# Patient Record
Sex: Male | Born: 1940 | Race: White | Hispanic: No | Marital: Married | Smoking: Never smoker
Health system: Southern US, Community
[De-identification: ages and names within clinical notes are randomized; demographics above are authoritative.]

## PROBLEM LIST (undated history)

## (undated) DIAGNOSIS — K222 Esophageal obstruction: Secondary | ICD-10-CM

## (undated) DIAGNOSIS — A0472 Enterocolitis due to Clostridium difficile, not specified as recurrent: Secondary | ICD-10-CM

## (undated) DIAGNOSIS — I10 Essential (primary) hypertension: Secondary | ICD-10-CM

## (undated) DIAGNOSIS — K449 Diaphragmatic hernia without obstruction or gangrene: Secondary | ICD-10-CM

## (undated) DIAGNOSIS — I2510 Atherosclerotic heart disease of native coronary artery without angina pectoris: Secondary | ICD-10-CM

## (undated) DIAGNOSIS — K224 Dyskinesia of esophagus: Secondary | ICD-10-CM

## (undated) DIAGNOSIS — A0471 Enterocolitis due to Clostridium difficile, recurrent: Secondary | ICD-10-CM

## (undated) DIAGNOSIS — K225 Diverticulum of esophagus, acquired: Secondary | ICD-10-CM

## (undated) HISTORY — DX: Enterocolitis due to Clostridium difficile, not specified as recurrent: A04.72

## (undated) HISTORY — DX: Diaphragmatic hernia without obstruction or gangrene: K44.9

## (undated) HISTORY — DX: Essential (primary) hypertension: I10

## (undated) HISTORY — DX: Diverticulum of esophagus, acquired: K22.5

## (undated) HISTORY — DX: Enterocolitis due to Clostridium difficile, recurrent: A04.71

## (undated) HISTORY — DX: Esophageal obstruction: K22.2

## (undated) HISTORY — PX: UMBILICAL HERNIA REPAIR: SHX196

## (undated) HISTORY — PX: BELPHAROPTOSIS REPAIR: SHX369

## (undated) HISTORY — DX: Dyskinesia of esophagus: K22.4

## (undated) HISTORY — PX: CATARACT EXTRACTION: SUR2

---
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
## 2001-06-01 ENCOUNTER — Encounter: Payer: Self-pay | Admitting: Gastroenterology

## 2001-06-01 ENCOUNTER — Ambulatory Visit (HOSPITAL_COMMUNITY): Admission: RE | Admit: 2001-06-01 | Discharge: 2001-06-01 | Payer: Self-pay | Admitting: Gastroenterology

## 2001-07-18 ENCOUNTER — Encounter: Admission: RE | Admit: 2001-07-18 | Discharge: 2001-07-18 | Payer: Self-pay | Admitting: Gastroenterology

## 2001-07-18 ENCOUNTER — Encounter: Payer: Self-pay | Admitting: Gastroenterology

## 2002-12-05 ENCOUNTER — Encounter: Payer: Self-pay | Admitting: Emergency Medicine

## 2002-12-05 ENCOUNTER — Emergency Department (HOSPITAL_COMMUNITY): Admission: EM | Admit: 2002-12-05 | Discharge: 2002-12-05 | Payer: Self-pay | Admitting: Emergency Medicine

## 2002-12-31 ENCOUNTER — Ambulatory Visit (HOSPITAL_COMMUNITY): Admission: RE | Admit: 2002-12-31 | Discharge: 2003-01-01 | Payer: Self-pay | Admitting: General Surgery

## 2002-12-31 ENCOUNTER — Encounter: Payer: Self-pay | Admitting: General Surgery

## 2004-10-11 HISTORY — PX: CHOLECYSTECTOMY: SHX55

## 2014-03-02 ENCOUNTER — Encounter (HOSPITAL_COMMUNITY): Payer: Self-pay | Admitting: Emergency Medicine

## 2014-03-02 DIAGNOSIS — Y838 Other surgical procedures as the cause of abnormal reaction of the patient, or of later complication, without mention of misadventure at the time of the procedure: Secondary | ICD-10-CM | POA: Diagnosis not present

## 2014-03-02 DIAGNOSIS — K5669 Other intestinal obstruction: Secondary | ICD-10-CM | POA: Diagnosis present

## 2014-03-02 DIAGNOSIS — N329 Bladder disorder, unspecified: Secondary | ICD-10-CM | POA: Diagnosis present

## 2014-03-02 DIAGNOSIS — Z9089 Acquired absence of other organs: Secondary | ICD-10-CM

## 2014-03-02 DIAGNOSIS — Z79899 Other long term (current) drug therapy: Secondary | ICD-10-CM

## 2014-03-02 DIAGNOSIS — E871 Hypo-osmolality and hyponatremia: Secondary | ICD-10-CM | POA: Diagnosis not present

## 2014-03-02 DIAGNOSIS — K449 Diaphragmatic hernia without obstruction or gangrene: Principal | ICD-10-CM | POA: Diagnosis present

## 2014-03-02 DIAGNOSIS — K560 Paralytic ileus: Secondary | ICD-10-CM | POA: Diagnosis not present

## 2014-03-02 DIAGNOSIS — K929 Disease of digestive system, unspecified: Secondary | ICD-10-CM | POA: Diagnosis not present

## 2014-03-02 LAB — BASIC METABOLIC PANEL
BUN: 14 mg/dL (ref 6–23)
CO2: 27 mEq/L (ref 19–32)
Calcium: 10.3 mg/dL (ref 8.4–10.5)
Chloride: 102 mEq/L (ref 96–112)
Creatinine, Ser: 1.17 mg/dL (ref 0.50–1.35)
GFR calc Af Amer: 71 mL/min — ABNORMAL LOW (ref >90)
GFR calc non Af Amer: 61 mL/min — ABNORMAL LOW (ref >90)
Glucose, Bld: 145 mg/dL — ABNORMAL HIGH (ref 70–99)
Potassium: 4.9 mEq/L (ref 3.7–5.3)
Sodium: 140 mEq/L (ref 137–147)

## 2014-03-02 LAB — CBC WITH DIFFERENTIAL/PLATELET
Basophils Absolute: 0 10*3/uL (ref 0.0–0.1)
Basophils Relative: 0 % (ref 0–1)
Eosinophils Absolute: 0 10*3/uL (ref 0.0–0.7)
Eosinophils Relative: 1 % (ref 0–5)
HCT: 44.4 % (ref 39.0–52.0)
Hemoglobin: 15.6 g/dL (ref 13.0–17.0)
Lymphocytes Relative: 11 % — ABNORMAL LOW (ref 12–46)
Lymphs Abs: 0.8 10*3/uL (ref 0.7–4.0)
MCH: 30.5 pg (ref 26.0–34.0)
MCHC: 35.1 g/dL (ref 30.0–36.0)
MCV: 86.9 fL (ref 78.0–100.0)
Monocytes Absolute: 0.2 10*3/uL (ref 0.1–1.0)
Monocytes Relative: 3 % (ref 3–12)
Neutro Abs: 6.6 10*3/uL (ref 1.7–7.7)
Neutrophils Relative %: 85 % — ABNORMAL HIGH (ref 43–77)
Platelets: 193 10*3/uL (ref 150–400)
RBC: 5.11 MIL/uL (ref 4.22–5.81)
RDW: 13.3 % (ref 11.5–15.5)
WBC: 7.7 10*3/uL (ref 4.0–10.5)

## 2014-03-02 LAB — I-STAT TROPONIN, ED: Troponin i, poc: 0 ng/mL (ref 0.00–0.08)

## 2014-03-02 NOTE — ED Notes (Signed)
Patient stated that he mowed the grass, washed the car and walked the dog without eating.   When he came in he started with epigastric pain and thought that it was because he was hungry.   Pain continued.  Denies CP but c/o upper abd pain

## 2014-03-03 ENCOUNTER — Emergency Department (HOSPITAL_COMMUNITY): Payer: Medicare Other

## 2014-03-03 ENCOUNTER — Inpatient Hospital Stay (HOSPITAL_COMMUNITY)
Admission: EM | Admit: 2014-03-03 | Discharge: 2014-03-14 | DRG: 327 | Disposition: A | Discharge status: Home / Self Care | Payer: Medicare Other | Attending: Surgery | Admitting: Surgery

## 2014-03-03 ENCOUNTER — Encounter (HOSPITAL_COMMUNITY): Payer: Self-pay | Admitting: Radiology

## 2014-03-03 DIAGNOSIS — Z9089 Acquired absence of other organs: Secondary | ICD-10-CM

## 2014-03-03 DIAGNOSIS — Z9049 Acquired absence of other specified parts of digestive tract: Secondary | ICD-10-CM

## 2014-03-03 DIAGNOSIS — K56609 Unspecified intestinal obstruction, unspecified as to partial versus complete obstruction: Secondary | ICD-10-CM

## 2014-03-03 DIAGNOSIS — N3289 Other specified disorders of bladder: Secondary | ICD-10-CM | POA: Diagnosis present

## 2014-03-03 DIAGNOSIS — R1013 Epigastric pain: Secondary | ICD-10-CM

## 2014-03-03 DIAGNOSIS — K56600 Partial intestinal obstruction, unspecified as to cause: Secondary | ICD-10-CM | POA: Diagnosis present

## 2014-03-03 DIAGNOSIS — K449 Diaphragmatic hernia without obstruction or gangrene: Secondary | ICD-10-CM

## 2014-03-03 LAB — HEPATIC FUNCTION PANEL
ALT: 18 U/L (ref 0–53)
AST: 26 U/L (ref 0–37)
Albumin: 4 g/dL (ref 3.5–5.2)
Alkaline Phosphatase: 63 U/L (ref 39–117)
Bilirubin, Direct: 0.3 mg/dL (ref 0.0–0.3)
Indirect Bilirubin: 0.7 mg/dL (ref 0.3–0.9)
Total Bilirubin: 1 mg/dL (ref 0.3–1.2)
Total Protein: 7.1 g/dL (ref 6.0–8.3)

## 2014-03-03 LAB — I-STAT TROPONIN, ED: Troponin i, poc: 0 ng/mL (ref 0.00–0.08)

## 2014-03-03 LAB — LIPASE, BLOOD: Lipase: 22 U/L (ref 11–59)

## 2014-03-03 LAB — I-STAT CG4 LACTIC ACID, ED: Lactic Acid, Venous: 1.28 mmol/L (ref 0.5–2.2)

## 2014-03-03 MED ORDER — MORPHINE SULFATE 4 MG/ML IJ SOLN
4.0000 mg | Freq: Once | INTRAMUSCULAR | Status: AC
  Administered 2014-03-03: 4 mg via INTRAVENOUS
  Filled 2014-03-03: qty 1

## 2014-03-03 MED ORDER — IOHEXOL 300 MG/ML  SOLN
25.0000 mL | Freq: Once | INTRAMUSCULAR | Status: AC | PRN
  Administered 2014-03-03: 25 mL via ORAL

## 2014-03-03 MED ORDER — FAMOTIDINE IN NACL 20-0.9 MG/50ML-% IV SOLN
20.0000 mg | Freq: Two times a day (BID) | INTRAVENOUS | Status: DC
  Administered 2014-03-03 – 2014-03-11 (×18): 20 mg via INTRAVENOUS
  Filled 2014-03-03 (×21): qty 50

## 2014-03-03 MED ORDER — ONDANSETRON HCL 4 MG/2ML IJ SOLN
4.0000 mg | Freq: Once | INTRAMUSCULAR | Status: AC
  Administered 2014-03-03: 4 mg via INTRAVENOUS
  Filled 2014-03-03: qty 2

## 2014-03-03 MED ORDER — ONDANSETRON 8 MG/NS 50 ML IVPB
8.0000 mg | Freq: Four times a day (QID) | INTRAVENOUS | Status: DC | PRN
  Filled 2014-03-03: qty 8

## 2014-03-03 MED ORDER — ONDANSETRON HCL 4 MG PO TABS: 4.0000 mg | ORAL_TABLET | Freq: Four times a day (QID) | ORAL | Status: DC | PRN

## 2014-03-03 MED ORDER — POTASSIUM CHLORIDE IN NACL 20-0.9 MEQ/L-% IV SOLN
INTRAVENOUS | Status: DC
Start: 2014-03-03 — End: 2014-03-06
  Administered 2014-03-03: 75 mL/h via INTRAVENOUS
  Administered 2014-03-04 (×2): via INTRAVENOUS
  Administered 2014-03-05: 1000 mL via INTRAVENOUS
  Administered 2014-03-06: 01:00:00 via INTRAVENOUS
  Filled 2014-03-03 (×7): qty 1000

## 2014-03-03 MED ORDER — HYDROMORPHONE HCL PF 1 MG/ML IJ SOLN
0.5000 mg | INTRAMUSCULAR | Status: DC | PRN
  Administered 2014-03-03: 0.5 mg via INTRAVENOUS
  Filled 2014-03-03: qty 1

## 2014-03-03 MED ORDER — IOHEXOL 300 MG/ML  SOLN
100.0000 mL | Freq: Once | INTRAMUSCULAR | Status: AC | PRN
  Administered 2014-03-03: 100 mL via INTRAVENOUS

## 2014-03-03 MED ORDER — MORPHINE SULFATE 2 MG/ML IJ SOLN
1.0000 mg | INTRAMUSCULAR | Status: DC | PRN
  Administered 2014-03-03 – 2014-03-05 (×7): 1 mg via INTRAVENOUS
  Filled 2014-03-03 (×7): qty 1

## 2014-03-03 MED ORDER — ENOXAPARIN SODIUM 40 MG/0.4ML ~~LOC~~ SOLN
40.0000 mg | SUBCUTANEOUS | Status: DC
  Administered 2014-03-04 – 2014-03-05 (×2): 40 mg via SUBCUTANEOUS
  Filled 2014-03-03 (×4): qty 0.4

## 2014-03-03 MED ORDER — FENTANYL CITRATE 0.05 MG/ML IJ SOLN
50.0000 ug | Freq: Once | INTRAMUSCULAR | Status: AC
  Administered 2014-03-03: 50 ug via INTRAVENOUS
  Filled 2014-03-03: qty 2

## 2014-03-03 MED ORDER — SODIUM CHLORIDE 0.9 % IV BOLUS (SEPSIS)
1000.0000 mL | Freq: Once | INTRAVENOUS | Status: AC
  Administered 2014-03-03: 1000 mL via INTRAVENOUS

## 2014-03-03 NOTE — ED Notes (Signed)
NPO

## 2014-03-03 NOTE — H&P (Addendum)
[facility] History and Physical  Patient: Troy Sanchez  SHF:[id number]  DOB: 01/16/1941  DOS: the patient was seen and examined on 03/03/2014 PCP: Fanny Skates, MD  Chief Complaint: Abdominal pain  HPI: Troy Sanchez is a 73 y.o. male with Past medical history of cholecystectomy. Patient presented with complaints of abdominal pain the mentions that he had an active day throughout the day today and after his days work he started having complaints of pain in his upper stomach. This was followed by nausea. This was followed by dry heaving. The pain was progressively getting worse and therefore the patient was brought into the hospital. Patient denies any similar episode in the past of the severity of the accident. He has some on and off nausea but no vomiting. He has history of cholecystectomy 2011. No history of small bowel obstruction.  The patient is coming from home. And at his baseline independent for most of his ADL.  Review of Systems: as mentioned in the history of present illness.  A Comprehensive review of the other systems is negative.  History reviewed. No pertinent past medical history. History reviewed. No pertinent past surgical history. Social History:  reports that he has never smoked. He has never used smokeless tobacco. He reports that he drinks alcohol. He reports that he does not use illicit drugs.  No Known Allergies  History reviewed. No pertinent family history.  Prior to Admission medications   Medication Sig Start Date End Date Taking? Authorizing Provider  cholecalciferol (VITAMIN D) 1000 UNITS tablet Take 1,000 Units by mouth daily.   Yes Historical Provider, MD  Multiple Vitamins-Minerals (MULTIVITAMIN WITH MINERALS) tablet Take 1 tablet by mouth daily.   Yes Historical Provider, MD  Omega-3 Fatty Acids (FISH OIL) 1000 MG CAPS Take 1,000 mg by mouth daily.   Yes Historical Provider, MD  Simethicone (GAS RELIEF EXTRA STRENGTH PO) Take 1  tablet by mouth every hour as needed (gas).   Yes Historical Provider, MD    Physical Exam: Filed Vitals:   03/03/14 0600 03/03/14 0615 03/03/14 0630 03/03/14 0633  BP: 123/79 139/88 133/78 133/78  Pulse: 74 73 82 79  Temp:      TempSrc:      Resp: 20 18 16 18   Height:      Weight:      SpO2: 90% 96% 94% 100%    General: Alert, Awake and Oriented to Time, Place and Person. Appear in mild distress Eyes: PERRL ENT: Oral Mucosa clear moist. Neck: No JVD Cardiovascular: S1 and S2 Present, no Murmur, Peripheral Pulses Present Respiratory: Bilateral Air entry equal and Decreased, Clear to Auscultation,  No Crackles, no wheezes Abdomen: Bowel Sound Present, Soft and mild tender no guarding no rigidity Skin: No Rash Extremities: No Pedal edema, no calf tenderness Neurologic: Grossly no focal neuro deficit.  Labs on Admission:  CBC:  Recent Labs Lab 03/02/14 2317  WBC 7.7  NEUTROABS 6.6  HGB 15.6  HCT 44.4  MCV 86.9  PLT 193    CMP     Component Value Date/Time   NA 140 03/02/2014 2317   K 4.9 03/02/2014 2317   CL 102 03/02/2014 2317   CO2 27 03/02/2014 2317   GLUCOSE 145* 03/02/2014 2317   BUN 14 03/02/2014 2317   CREATININE 1.17 03/02/2014 2317   CALCIUM 10.3 03/02/2014 2317   PROT 7.1 03/03/2014 0225   ALBUMIN 4.0 03/03/2014 0225   AST 26 03/03/2014 0225   ALT 18 03/03/2014 0225   ALKPHOS  63 03/03/2014 0225   BILITOT 1.0 03/03/2014 0225   GFRNONAA 61* 03/02/2014 2317   GFRAA 71* 03/02/2014 2317     Recent Labs Lab 03/03/14 0225  LIPASE 22   No results found for this basename: AMMONIA,  in the last 168 hours  No results found for this basename: CKTOTAL, CKMB, CKMBINDEX, TROPONINI,  in the last 168 hours BNP (last 3 results) No results found for this basename: PROBNP,  in the last 8760 hours  Radiological Exams on Admission: Ct Abdomen Pelvis W Contrast  03/03/2014   CLINICAL DATA:  Epigastric pain with nausea.  EXAM: CT ABDOMEN AND PELVIS WITH CONTRAST   TECHNIQUE: Multidetector CT imaging of the abdomen and pelvis was performed using the standard protocol following bolus administration of intravenous contrast.  CONTRAST:  171mL OMNIPAQUE IOHEXOL 300 MG/ML SOLN, 62mL OMNIPAQUE IOHEXOL 300 MG/ML SOLN  COMPARISON:  None.  FINDINGS: Massive hiatal hernia, containing the entire stomach, multiple loops of fluid filled mildly distended small bowel with transition point along the left crus of the diaphragm, coronal 48/96. Small bowel within the hernia sac measures up to 3.4 cm in transaxial dimension. Hernia sac resultant compressive atelectasis. Mild mass effect on the left heart border, due to the hernia sac.  The liver, spleen, and adrenal glands are unremarkable. Status post cholecystectomy. A few punctate calcifications within the pancreatic head, without discrete mass.  The large bowel are normal in course and caliber without inflammatory changes. A few colonic diverticula with mild amount of retained large bowel stool. Normal appendix. No intraperitoneal free fluid nor free air.  Kidneys are orthotopic, demonstrating symmetric enhancement without hydronephrosis or renal masses. Bilateral nonobstructing nephrolithiasis measure up to 3 mm. The unopacified ureters are normal in course and caliber. Delayed imaging through the kidneys demonstrates symmetric prompt excretion to the proximal urinary collecting system. Urinary bladder is well distended, dense. 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  Aortoiliac vessels are normal in course and caliber with trace calcific atherosclerosis. No lymphadenopathy by CT size criteria. Internal reproductive organs are unremarkable. The soft tissues and included osseous structures are nonsuspicious. Small fat containing umbilical hernia.  IMPRESSION: Massive hiatal hernia, containing the entire stomach, and associated partial small bowel obstruction, with  transition point in the left upper quadrant, along the crus of the left diaphragm.  Bilateral nonobstructing nephrolithiasis.  2.5 cm mass at bladder trigone likely reflects prostatic invasion with associated prostatomegaly though, this less likely reflects hematoma.   Electronically Signed   By: Elon Alas   On: 03/03/2014 05:48    Assessment/Plan Principal Problem:   Partial small bowel obstruction Active Problems:   History of cholecystectomy   Hiatal hernia   1. Partial small bowel obstruction The patient is presenting with complaints of epigastric pain. A CT of the abdomen was performed in the ED which is positive for hiatal hernia as well as a small bowel obstruction partial. Patient has history of known hiatal hernia which is stable as per the patient and has not been evaluated since last 20 years. At present the patient mentions after insertion of NG tube he is feeling significantly better. He has almost put out 800 cc of fluid. At present I would continue with the NG tube we'll keep him n.p.o. except I states. Give him IV fluids with potassium. I would also give him Zofran as needed, Dilaudid as needed, Pepcid. If his symptoms does not improve he may  require surgical consultation.  2. 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 May need outpatient follow up. Patient unaware of any prostatic issues.   DVT Prophylaxis: subcutaneous Heparin Nutrition: N.p.o.  Code Status: Full  Family Communication: Wife was present at bedside, opportunity was given to ask question and all questions were answered satisfactorily at the time of interview. Disposition: Admitted to inpatient in med-surge unit.  Author: Berle Mull, MD [facility] Pager: [phone] 03/03/2014, 6:43 AM    If 7PM-7AM, please contact night-coverage [email or web address] Password [email or web address]

## 2014-03-03 NOTE — ED Notes (Signed)
Patient transported to CT 

## 2014-03-03 NOTE — ED Provider Notes (Signed)
CSN: [id number]     Arrival date & time 03/02/14  2247 History   First MD Initiated Contact with Patient 03/03/14 0047     Chief Complaint  Patient presents with  . Abdominal Pain     (Consider location/radiation/quality/duration/timing/severity/associated sxs/prior Treatment) HPI This patient is a very pleasant elderly man who comes in from home with complaints of epigastric pain which began around 2000h following some yard work. The patient has been nauseated but, has not vomited. He denies history of similar sx. He is s/p cholecystectomy, remotely. Pain is aching, nonradiating, constant. Nothing makes it better or worse. Last meal was at breakfast.   No fever. Last BM was about 2000h. Patient does not recall passing flatus since BM. He has no history of SBO. No history of pancreatitis or colitis or PUD  History reviewed. No pertinent past medical history. History reviewed. No pertinent past surgical history. History reviewed. No pertinent family history. History  Substance Use Topics  . Smoking status: Never Smoker   . Smokeless tobacco: Never Used  . Alcohol Use: Yes     Comment: ocassionally    Review of Systems Ten point review of symptoms performed and is negative with the exception of symptoms noted above.     Allergies  Review of patient's allergies indicates no known allergies.  Home Medications   Prior to Admission medications   Medication Sig Start Date End Date Taking? Authorizing Provider  cholecalciferol (VITAMIN D) 1000 UNITS tablet Take 1,000 Units by mouth daily.   Yes Historical Provider, MD  Multiple Vitamins-Minerals (MULTIVITAMIN WITH MINERALS) tablet Take 1 tablet by mouth daily.   Yes Historical Provider, MD  Omega-3 Fatty Acids (FISH OIL) 1000 MG CAPS Take 1,000 mg by mouth daily.   Yes Historical Provider, MD  Simethicone (GAS RELIEF EXTRA STRENGTH PO) Take 1 tablet by mouth every hour as needed (gas).   Yes Historical Provider, MD   BP 133/82   Pulse 76  Temp(Src) 97.9 F (36.6 C) (Oral)  Resp 21  Ht 5\' 10"  (1.778 m)  Wt 210 lb (95.255 kg)  BMI 30.13 kg/m2  SpO2 94% Physical Exam Gen: well developed and well nourished appearing Head: NCAT Eyes: PERL, EOMI Nose: no epistaixis or rhinorrhea Mouth/throat: mucosa is moist and pink Neck: supple, no stridor Lungs: CTA B, no wheezing, rhonchi or rales CV: RRR, no murmur, extremities appear well perfused.  Abd: soft, mild midline ttp, nondistended Back: no ttp, no cva ttp Skin: warm and dry Ext: normal to inspection, no dependent edema Neuro: CN ii-xii grossly intact, no focal deficits Psyche; normal affect,  calm and cooperative.   ED Course  Procedures (including critical care time) Labs Review  Results for orders placed during the hospital encounter of 03/03/14 (from the past 24 hour(s))  CBC WITH DIFFERENTIAL     Status: Abnormal   Collection Time    03/02/14 11:17 PM      Result Value Ref Range   WBC 7.7  4.0 - 10.5 K/uL   RBC 5.11  4.22 - 5.81 MIL/uL   Hemoglobin 15.6  13.0 - 17.0 g/dL   HCT 44.4  39.0 - 52.0 %   MCV 86.9  78.0 - 100.0 fL   MCH 30.5  26.0 - 34.0 pg   MCHC 35.1  30.0 - 36.0 g/dL   RDW 13.3  11.5 - 15.5 %   Platelets 193  150 - 400 K/uL   Neutrophils Relative % 85 (*) 43 - 77 %  Neutro Abs 6.6  1.7 - 7.7 K/uL   Lymphocytes Relative 11 (*) 12 - 46 %   Lymphs Abs 0.8  0.7 - 4.0 K/uL   Monocytes Relative 3  3 - 12 %   Monocytes Absolute 0.2  0.1 - 1.0 K/uL   Eosinophils Relative 1  0 - 5 %   Eosinophils Absolute 0.0  0.0 - 0.7 K/uL   Basophils Relative 0  0 - 1 %   Basophils Absolute 0.0  0.0 - 0.1 K/uL  BASIC METABOLIC PANEL     Status: Abnormal   Collection Time    03/02/14 11:17 PM      Result Value Ref Range   Sodium 140  137 - 147 mEq/L   Potassium 4.9  3.7 - 5.3 mEq/L   Chloride 102  96 - 112 mEq/L   CO2 27  19 - 32 mEq/L   Glucose, Bld 145 (*) 70 - 99 mg/dL   BUN 14  6 - 23 mg/dL   Creatinine, Ser 1.17  0.50 - 1.35 mg/dL    Calcium 10.3  8.4 - 10.5 mg/dL   GFR calc non Af Amer 61 (*) >90 mL/min   GFR calc Af Amer 71 (*) >90 mL/min  I-STAT TROPOININ, ED     Status: None   Collection Time    03/02/14 11:31 PM      Result Value Ref Range   Troponin i, poc 0.00  0.00 - 0.08 ng/mL   Comment 3           LIPASE, BLOOD     Status: None   Collection Time    03/03/14  2:25 AM      Result Value Ref Range   Lipase 22  11 - 59 U/L  HEPATIC FUNCTION PANEL     Status: None   Collection Time    03/03/14  2:25 AM      Result Value Ref Range   Total Protein 7.1  6.0 - 8.3 g/dL   Albumin 4.0  3.5 - 5.2 g/dL   AST 26  0 - 37 U/L   ALT 18  0 - 53 U/L   Alkaline Phosphatase 63  39 - 117 U/L   Total Bilirubin 1.0  0.3 - 1.2 mg/dL   Bilirubin, Direct 0.3  0.0 - 0.3 mg/dL   Indirect Bilirubin 0.7  0.3 - 0.9 mg/dL  I-STAT CG4 LACTIC ACID, ED     Status: None   Collection Time    03/03/14  2:43 AM      Result Value Ref Range   Lactic Acid, Venous 1.28  0.5 - 2.2 mmol/L  I-STAT TROPOININ, ED     Status: None   Collection Time    03/03/14  5:26 AM      Result Value Ref Range   Troponin i, poc 0.00  0.00 - 0.08 ng/mL   Comment 3            IOHEXOL 300 MG/ML SOLN  COMPARISON: None.  FINDINGS: Massive hiatal hernia, containing the entire stomach, multiple loops of fluid filled mildly distended small bowel with transition point along the left crus of the diaphragm, coronal 48/96. Small bowel within the hernia sac measures up to 3.4 cm in transaxial dimension. Hernia sac resultant compressive atelectasis. Mild mass effect on the left heart border, due to the hernia sac.  The liver, spleen, and adrenal glands are unremarkable. Status post cholecystectomy. A few punctate calcifications within the pancreatic head, without  discrete mass.  The large bowel are normal in course and caliber without inflammatory changes. A few colonic diverticula with mild amount of retained large bowel stool. Normal appendix. No  intraperitoneal free fluid nor free air.  Kidneys are orthotopic, demonstrating symmetric enhancement without hydronephrosis or renal masses. Bilateral nonobstructing nephrolithiasis measure up to 3 mm. The unopacified ureters are normal in course and caliber. Delayed imaging through the kidneys demonstrates symmetric prompt excretion to the proximal urinary collecting system. Urinary bladder is well distended, dense. 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  Aortoiliac vessels are normal in course and caliber with trace calcific atherosclerosis. No lymphadenopathy by CT size criteria. Internal reproductive organs are unremarkable. The soft tissues and included osseous structures are nonsuspicious. Small fat containing umbilical hernia.  IMPRESSION: Massive hiatal hernia, containing the entire stomach, and associated partial small bowel obstruction, with transition point in the left upper quadrant, along the crus of the left diaphragm.  Bilateral nonobstructing nephrolithiasis.  2.5 cm mass at bladder trigone likely reflects prostatic invasion with associated prostatomegaly though, this less likely reflects hematoma.   Electronically Signed By: Elon Alas On: 03/03/2014 05:48        EKG: nsr, no acute ischemic changes, normal intervals, normal axis, normal qrs complex  MDM   DDX: gastritis, PUD, GERD, pancreatitis, , SBO, colitis, UTI, enteritis, mesenteric ischemia.   Patient treated with several doses of IV analgesia along with antiemetic and IVC. Continued pain. CT scan shows partial SBO. We have placed NGT with 1L of gastric contents returned. Case discussed with Dr. Posey Pronto who will see and admit.    Elyn Peers, MD 03/03/14 [phone]

## 2014-03-03 NOTE — Progress Notes (Signed)
Troy Sanchez [id number] Admission Data: 03/03/2014 10:59 AM Attending Provider: Koleen Nimrod Acost*  RAQ:[id number], MD Consults/ Treatment Team:    Troy Sanchez is a 73 y.o. male patient admitted from ED awake, alert  & orientated  X 3,  Full Code, VSS - Blood pressure 142/88, pulse 72, temperature 97.9 F (36.6 C), temperature source Oral, resp. rate 18, height 5\' 10"  (1.778 m), weight 95.255 kg (210 lb), SpO2 96.00%., no c/o shortness of breath, no c/o chest pain, no distress noted.   Allergies:  No Known Allergies   History reviewed. No pertinent past medical history.  Pt orientation to unit, room and routine. Information packet given to patient/family and safety video watched.  Admission INP armband ID verified with patient/family, and in place. SR up x 2, fall risk assessment complete with Patient and family verbalizing understanding of risks associated with falls. Pt verbalizes an understanding of how to use the call bell and to call for help before getting out of bed.  Skin, clean-dry- intact without evidence of bruising, or skin tears.   No evidence of skin break down noted on exam.    Will cont to monitor and assist as needed.  Dayle Points, RN 03/03/2014 10:59 AM

## 2014-03-03 NOTE — ED Notes (Signed)
Returned from ct 

## 2014-03-03 NOTE — Progress Notes (Signed)
Patient seen and examined. Database reviewed. Admitted earlier today for abdominal pain/n/v. Found to have a large hiatal hernia on CT with a SBO. Has already had significant NG output. Continue NG to low-intermittent suction today. If feeling better in am, consider clamping NG and trial of clears. If fails to improve will need a surgical consultation. Will continue to follow.  Domingo Mend, MD [facility] Pager: [phone]

## 2014-03-03 NOTE — ED Notes (Signed)
EDP Manly aware this RN treated pain with protocol pain management and will hold ordered Morphine for additional dosing

## 2014-03-03 NOTE — ED Notes (Signed)
Pt presents with upper abd pain, nausea, and episodes of "dry heaving" starting last night approx 1700. Pt's spouse reports he ate a large breakfast approx 1000 yesterday morning then mowed the grass, washed the car and walked the dog and had very little water during this time

## 2014-03-03 NOTE — ED Notes (Signed)
MD at bedside.Dr. Posey Pronto at bedside.

## 2014-03-04 LAB — CBC
HCT: 50.5 % (ref 39.0–52.0)
Hemoglobin: 16.8 g/dL (ref 13.0–17.0)
MCH: 29.6 pg (ref 26.0–34.0)
MCHC: 33.3 g/dL (ref 30.0–36.0)
MCV: 88.9 fL (ref 78.0–100.0)
Platelets: 220 10*3/uL (ref 150–400)
RBC: 5.68 MIL/uL (ref 4.22–5.81)
RDW: 13.9 % (ref 11.5–15.5)
WBC: 7.7 10*3/uL (ref 4.0–10.5)

## 2014-03-04 LAB — BASIC METABOLIC PANEL
BUN: 25 mg/dL — ABNORMAL HIGH (ref 6–23)
CO2: 26 mEq/L (ref 19–32)
Calcium: 9.6 mg/dL (ref 8.4–10.5)
Chloride: 104 mEq/L (ref 96–112)
Creatinine, Ser: 1.04 mg/dL (ref 0.50–1.35)
GFR calc Af Amer: 81 mL/min — ABNORMAL LOW (ref >90)
GFR calc non Af Amer: 70 mL/min — ABNORMAL LOW (ref >90)
Glucose, Bld: 147 mg/dL — ABNORMAL HIGH (ref 70–99)
Potassium: 4.4 mEq/L (ref 3.7–5.3)
Sodium: 142 mEq/L (ref 137–147)

## 2014-03-04 NOTE — Progress Notes (Signed)
[facility] PROGRESS NOTE  Troy Sanchez XIP:[id number] DOB: 08/10/1941 DOA: 03/03/2014 PCP: Fanny Skates, MD  Assessment/Plan: SBO -Patient accidentally pulled NG earlier today. -no N/v/abdominal pain. -Will attempt a trial of clears. -If does not tolerate, will need to revert to NPO and replace NG tube. -Plan discussed with RN. -Recheck abdominal films in am.  Hiatal Hernia -Large in size. -Chronic issue.  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 -Will recommend OP GU follow up.  Code Status: Full Code Family Communication: Patient only  Disposition Plan: To be determined.   Consultants:  None   Antibiotics:  None   Subjective: No complaints. NG out. Abdomen feels a little bloated still.  Objective: Filed Vitals:   03/03/14 1353 03/03/14 2058 03/04/14 0444 03/04/14 0500  BP: 132/89 123/80 125/86   Pulse: 72 80 76   Temp: 98.3 F (36.8 C) 98.4 F (36.9 C) 98.6 F (37 C)   TempSrc: Oral Oral Oral   Resp: 18 18 18    Height:      Weight:    91.8 kg (202 lb 6.1 oz)  SpO2: 93% 92% 91%     Intake/Output Summary (Last 24 hours) at 03/04/14 1003 Last data filed at 03/03/14 1809  Gross per 24 hour  Intake  577.5 ml  Output      0 ml  Net  577.5 ml   Filed Weights   03/02/14 2301 03/04/14 0500  Weight: 95.255 kg (210 lb) 91.8 kg (202 lb 6.1 oz)    Exam:   General:  AA Ox3  Cardiovascular: RRR  Respiratory: CTA B  Abdomen: S/distended/+BS  Extremities: no C/C/E   Neurologic:  Non-focal  Data Reviewed: Basic Metabolic Panel:  Recent Labs Lab 03/02/14 2317 03/04/14 0453  NA 140 142  K 4.9 4.4  CL 102 104  CO2 27 26  GLUCOSE 145* 147*  BUN 14 25*  CREATININE 1.17 1.04  CALCIUM 10.3 9.6   Liver Function Tests:  Recent Labs Lab 03/03/14 0225  AST 26  ALT 18  ALKPHOS 63  BILITOT 1.0  PROT 7.1  ALBUMIN 4.0    Recent Labs Lab  03/03/14 0225  LIPASE 22   No results found for this basename: AMMONIA,  in the last 168 hours CBC:  Recent Labs Lab 03/02/14 2317 03/04/14 0453  WBC 7.7 7.7  NEUTROABS 6.6  --   HGB 15.6 16.8  HCT 44.4 50.5  MCV 86.9 88.9  PLT 193 220   Cardiac Enzymes: No results found for this basename: CKTOTAL, CKMB, CKMBINDEX, TROPONINI,  in the last 168 hours BNP (last 3 results) No results found for this basename: PROBNP,  in the last 8760 hours CBG: No results found for this basename: GLUCAP,  in the last 168 hours  No results found for this or any previous visit (from the past 240 hour(s)).   Studies: Ct Abdomen Pelvis W Contrast  03/03/2014   CLINICAL DATA:  Epigastric pain with nausea.  EXAM: CT ABDOMEN AND PELVIS WITH CONTRAST  TECHNIQUE: Multidetector CT imaging of the abdomen and pelvis was performed using the standard protocol following bolus administration of intravenous contrast.  CONTRAST:  169mL OMNIPAQUE IOHEXOL 300 MG/ML SOLN, 16mL OMNIPAQUE IOHEXOL 300 MG/ML SOLN  COMPARISON:  None.  FINDINGS: Massive hiatal hernia, containing the entire stomach, multiple loops of fluid filled mildly distended small bowel with transition point along the left crus of the diaphragm, coronal 48/96. Small bowel within the hernia  sac measures up to 3.4 cm in transaxial dimension. Hernia sac resultant compressive atelectasis. Mild mass effect on the left heart border, due to the hernia sac.  The liver, spleen, and adrenal glands are unremarkable. Status post cholecystectomy. A few punctate calcifications within the pancreatic head, without discrete mass.  The large bowel are normal in course and caliber without inflammatory changes. A few colonic diverticula with mild amount of retained large bowel stool. Normal appendix. No intraperitoneal free fluid nor free air.  Kidneys are orthotopic, demonstrating symmetric enhancement without hydronephrosis or renal masses. Bilateral nonobstructing nephrolithiasis  measure up to 3 mm. The unopacified ureters are normal in course and caliber. Delayed imaging through the kidneys demonstrates symmetric prompt excretion to the proximal urinary collecting system. Urinary bladder is well distended, dense. 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  Aortoiliac vessels are normal in course and caliber with trace calcific atherosclerosis. No lymphadenopathy by CT size criteria. Internal reproductive organs are unremarkable. The soft tissues and included osseous structures are nonsuspicious. Small fat containing umbilical hernia.  IMPRESSION: Massive hiatal hernia, containing the entire stomach, and associated partial small bowel obstruction, with transition point in the left upper quadrant, along the crus of the left diaphragm.  Bilateral nonobstructing nephrolithiasis.  2.5 cm mass at bladder trigone likely reflects prostatic invasion with associated prostatomegaly though, this less likely reflects hematoma.   Electronically Signed   By: Elon Alas   On: 03/03/2014 05:48    Scheduled Meds: . enoxaparin (LOVENOX) injection  40 mg Subcutaneous Q24H  . famotidine (PEPCID) IV  20 mg Intravenous Q12H   Continuous Infusions: . 0.9 % NaCl with KCl 20 mEq / L 75 mL/hr at 03/04/14 0157    Principal Problem:   Partial small bowel obstruction Active Problems:   History of cholecystectomy   Hiatal hernia   Bladder mass    Time spent: 35 minutes. Greater than 50% of this time was spent in direct contact with the patient coordinating care.    [facility] Pager [phone]  If 7PM-7AM, please contact night-coverage at [email or web address], password [facility] 03/04/2014, 10:03 AM  LOS: 1 day

## 2014-03-04 NOTE — Progress Notes (Signed)
Pt accidentally removed NG tube. MD paged. MD stated to leave NG tube out for now and will do a trial of clear liquid to see how pt tolerates it. NG tube may need to be replaced. Will continue to monitor pt.

## 2014-03-05 ENCOUNTER — Encounter (HOSPITAL_COMMUNITY): Payer: Self-pay | Admitting: General Surgery

## 2014-03-05 ENCOUNTER — Inpatient Hospital Stay (HOSPITAL_COMMUNITY): Payer: Medicare Other

## 2014-03-05 DIAGNOSIS — K449 Diaphragmatic hernia without obstruction or gangrene: Secondary | ICD-10-CM

## 2014-03-05 DIAGNOSIS — K56609 Unspecified intestinal obstruction, unspecified as to partial versus complete obstruction: Secondary | ICD-10-CM

## 2014-03-05 MED ORDER — DEXTROSE 5 % IV SOLN
2.0000 g | INTRAVENOUS | Status: AC
  Administered 2014-03-06: 2 g via INTRAVENOUS
  Filled 2014-03-05 (×2): qty 2

## 2014-03-05 MED ORDER — MORPHINE SULFATE 2 MG/ML IJ SOLN
2.0000 mg | INTRAMUSCULAR | Status: DC | PRN
  Administered 2014-03-05 (×2): 2 mg via INTRAVENOUS
  Filled 2014-03-05 (×2): qty 1

## 2014-03-05 MED ORDER — ENOXAPARIN SODIUM 40 MG/0.4ML ~~LOC~~ SOLN
40.0000 mg | SUBCUTANEOUS | Status: DC
  Administered 2014-03-06: 40 mg via SUBCUTANEOUS
  Filled 2014-03-05 (×2): qty 0.4

## 2014-03-05 MED ORDER — MORPHINE SULFATE 2 MG/ML IJ SOLN
2.0000 mg | Freq: Once | INTRAMUSCULAR | Status: AC
  Administered 2014-03-05: 2 mg via INTRAVENOUS
  Filled 2014-03-05: qty 1

## 2014-03-05 NOTE — Consult Note (Signed)
Patient was minimally symptomatic until Saturday when he developed severe upper abdominal pain.  He now has an NGT.  He has minimal pain currently, but CT clearly shows SB in the hernia.  He needs a hiatal hernia repair, Nissen fundoplication, gastrostomy tube placement.  This will be done tomorrow.   Kathryne Eriksson. Dahlia Bailiff, MD, [place] [phone] [facility]

## 2014-03-05 NOTE — Consult Note (Signed)
Troy Sanchez 1941/10/04  [id number].   Primary Care MD: Dr. Thressa Sheller Requesting MD: Dr. Rande Lawman Chief Complaint/Reason for Consult: SBO secondary to hiatal hernia HPI: This is a healthy 73 yo white male who has had a known small hiatal from an endoscopy about 15 years ago by Dr. Velora Heckler. At that time he was noted to have an esophageal stricture. He has been dilated twice. His last dilatation was years ago. No further progression of his hiatal hernia was noted at that time. The patient has not had any intermittent symptoms that he is aware of. He has complained of some gas pain that he'll take a Gas-X for but no other symptoms. This past Saturday the patient developed severe abdominal pain with some nausea. He denies any emesis. He has not had a bowel movement since 01-14-2023 night and has passed only very limited flatus. He presented to [facility] emergency department where he had a CT scan revealing a massive hiatal hernia with his entire stomach and some of his small bowel in his chest with a transition point near the crux of the diaphragm. He has been admitted and treated conservatively. He is not improving with this conservative management and we have been asked to evaluate the patient for further recommendations.  ROS : We see history of present illness otherwise all other systems have been reviewed and are negative.  History reviewed. No pertinent family history.  History reviewed. No pertinent past medical history.  Past Surgical History  Procedure Laterality Date  . Cholecystectomy  2006  . Umbilical hernia repair      Social History:  reports that he has never smoked. He has never used smokeless tobacco. He reports that he drinks alcohol. He reports that he does not use illicit drugs.  Allergies: No Known Allergies  Medications Prior to Admission  Medication Sig Dispense Refill  . cholecalciferol (VITAMIN D) 1000 UNITS tablet Take 1,000 Units by mouth daily.       . Multiple Vitamins-Minerals (MULTIVITAMIN WITH MINERALS) tablet Take 1 tablet by mouth daily.      . Omega-3 Fatty Acids (FISH OIL) 1000 MG CAPS Take 1,000 mg by mouth daily.      . Simethicone (GAS RELIEF EXTRA STRENGTH PO) Take 1 tablet by mouth every hour as needed (gas).        Blood pressure 119/80, pulse 77, temperature 98.2 F (36.8 C), temperature source Oral, resp. rate 18, height $RemoveBe'5\' 10"'tYIiVQUVf$  (1.778 m), weight 203 lb 11.3 oz (92.4 kg), SpO2 96.00%. Physical Exam: General: pleasant, WD, WN white male who is laying in bed in NAD HEENT: head is normocephalic, atraumatic.  Sclera are noninjected.  PERRL.  Ears and nose without any masses or lesions.  Mouth is pink and moist Heart: regular, rate, and rhythm.  Normal s1,s2. No obvious murmurs, gallops, or rubs noted.  Palpable radial and pedal pulses bilaterally Lungs: CTAB, no wheezes, rhonchi, or rales noted.  Respiratory effort nonlabored Abd: soft, nontender currently, mild distention of his upper abdomen with some tympany, about 150 cc of bilious output noted in his NG tube. +BS, no masses, hernias, or organomegaly MS: all 4 extremities are symmetrical with no cyanosis, clubbing, or edema. Skin: warm and dry with no masses, lesions, or rashes Psych: A&Ox3 with an appropriate affect.    Results for orders placed during the hospital encounter of 03/03/14 (from the past 48 hour(s))  BASIC METABOLIC PANEL     Status: Abnormal   Collection Time  03/04/14  4:53 AM      Result Value Ref Range   Sodium 142  137 - 147 mEq/L   Potassium 4.4  3.7 - 5.3 mEq/L   Chloride 104  96 - 112 mEq/L   CO2 26  19 - 32 mEq/L   Glucose, Bld 147 (*) 70 - 99 mg/dL   BUN 25 (*) 6 - 23 mg/dL   Comment: DELTA CHECK NOTED   Creatinine, Ser 1.04  0.50 - 1.35 mg/dL   Calcium 9.6  8.4 - 10.5 mg/dL   GFR calc non Af Amer 70 (*) >90 mL/min   GFR calc Af Amer 81 (*) >90 mL/min   Comment: (NOTE)     The eGFR has been calculated using the CKD EPI equation.      This calculation has not been validated in all clinical situations.     eGFR's persistently <90 mL/min signify possible Chronic Kidney     Disease.  CBC     Status: None   Collection Time    03/04/14  4:53 AM      Result Value Ref Range   WBC 7.7  4.0 - 10.5 K/uL   RBC 5.68  4.22 - 5.81 MIL/uL   Hemoglobin 16.8  13.0 - 17.0 g/dL   HCT 50.5  39.0 - 52.0 %   MCV 88.9  78.0 - 100.0 fL   MCH 29.6  26.0 - 34.0 pg   MCHC 33.3  30.0 - 36.0 g/dL   RDW 13.9  11.5 - 15.5 %   Platelets 220  150 - 400 K/uL   Dg Abd 2 Views  03/05/2014   CLINICAL DATA:  Abdominal pain and distention. follow up small bowel obstruction.  EXAM: ABDOMEN - 2 VIEW  COMPARISON:  CT 03/03/2014.  FINDINGS: There is progressive diffuse small bowel distension with multiple air-fluid levels on the erect examination. The colon remains relatively decompressed. The hiatal hernia is incompletely visualized. There is no free intraperitoneal air. Cholecystectomy clips and pelvic phleboliths are stable.  IMPRESSION: Worsening small bowel distension with multiple air-fluid levels consistent with small bowel obstruction. Patient may benefit from NG tube decompression.   Electronically Signed   By: Camie Patience M.D.   On: 03/05/2014 08:16       Assessment/Plan 1. Small bowel obstruction secondary to large hiatal hernia with entirety of stomach and some small intestine in chest  Plan: Given the extent of the patient's hiatal hernia and small bowel obstruction, the patient will likely need to undergo an operation for reduction of his hiatal hernia and repair. The patient does not have any other medical problems. He currently has an NG tube in for decompression. Continue this. He will be n.p.o. after midnight in preparation for definitive surgery tomorrow. We will hold his DVT prophylaxis tomorrow pending surgical intervention. I had a lengthy discussion with the patient and his wife explaining all this. They're very pleasant. They understand  and are agreeable to proceed with this plan. Thank you for this consultation.  [name] 03/05/2014, 1:21 PM Pager: [phone]

## 2014-03-05 NOTE — Progress Notes (Signed)
[facility] PROGRESS NOTE  DIO GILLER GMW:[id number] DOB: [birth year] DOA: 03/03/2014 PCP: Fanny Skates, MD  Assessment/Plan: SBO -Did not tolerate trial of clears: nausea; increased bloating. -Abdominal films with worsening SBO. -Will replace NG tube to LIS. -Surgical consult has been requested.  Hiatal Hernia -Large in size. -Chronic issue.  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 -Will recommend OP GU follow up.  Code Status: Full Code Family Communication: Patient only  Disposition Plan: To be determined; replace NG, surgical consult.   Consultants:  Surgery   Antibiotics:  None   Subjective: Feels very bloated, nauseous. No BM.  Objective: Filed Vitals:   03/04/14 1446 03/04/14 1959 03/05/14 0500 03/05/14 0820  BP: 128/80 118/80 118/81 119/80  Pulse: 79 77 76 77  Temp: 99.1 F (37.3 C) 98.5 F (36.9 C) 98.5 F (36.9 C) 98.2 F (36.8 C)  TempSrc: Oral Oral Oral Oral  Resp: 18 15 12 18   Height:      Weight:   92.4 kg (203 lb 11.3 oz)   SpO2: 94% 94% 93% 96%    Intake/Output Summary (Last 24 hours) at 03/05/14 1018 Last data filed at 03/04/14 1810  Gross per 24 hour  Intake 1951.25 ml  Output    500 ml  Net 1451.25 ml   Filed Weights   03/02/14 2301 03/04/14 0500 03/05/14 0500  Weight: 95.255 kg (210 lb) 91.8 kg (202 lb 6.1 oz) 92.4 kg (203 lb 11.3 oz)    Exam:   General:  AA Ox3  Cardiovascular: RRR  Respiratory: CTA B  Abdomen: S/distended/+BS  Extremities: no C/C/E   Neurologic:  Non-focal  Data Reviewed: Basic Metabolic Panel:  Recent Labs Lab 03/02/14 2317 03/04/14 0453  NA 140 142  K 4.9 4.4  CL 102 104  CO2 27 26  GLUCOSE 145* 147*  BUN 14 25*  CREATININE 1.17 1.04  CALCIUM 10.3 9.6   Liver Function Tests:  Recent Labs Lab 03/03/14 0225  AST 26  ALT 18  ALKPHOS 63  BILITOT 1.0  PROT 7.1  ALBUMIN 4.0    Recent  Labs Lab 03/03/14 0225  LIPASE 22   No results found for this basename: AMMONIA,  in the last 168 hours CBC:  Recent Labs Lab 03/02/14 2317 03/04/14 0453  WBC 7.7 7.7  NEUTROABS 6.6  --   HGB 15.6 16.8  HCT 44.4 50.5  MCV 86.9 88.9  PLT 193 220   Cardiac Enzymes: No results found for this basename: CKTOTAL, CKMB, CKMBINDEX, TROPONINI,  in the last 168 hours BNP (last 3 results) No results found for this basename: PROBNP,  in the last 8760 hours CBG: No results found for this basename: GLUCAP,  in the last 168 hours  No results found for this or any previous visit (from the past 240 hour(s)).   Studies: Dg Abd 2 Views  03/05/2014   CLINICAL DATA:  Abdominal pain and distention. follow up small bowel obstruction.  EXAM: ABDOMEN - 2 VIEW  COMPARISON:  CT 03/03/2014.  FINDINGS: There is progressive diffuse small bowel distension with multiple air-fluid levels on the erect examination. The colon remains relatively decompressed. The hiatal hernia is incompletely visualized. There is no free intraperitoneal air. Cholecystectomy clips and pelvic phleboliths are stable.  IMPRESSION: Worsening small bowel distension with multiple air-fluid levels consistent with small bowel obstruction. Patient may benefit from NG tube decompression.   Electronically Signed   By: Modesta Messing.D.  On: 03/05/2014 08:16    Scheduled Meds: . enoxaparin (LOVENOX) injection  40 mg Subcutaneous Q24H  . famotidine (PEPCID) IV  20 mg Intravenous Q12H   Continuous Infusions: . 0.9 % NaCl with KCl 20 mEq / L 1,000 mL (03/05/14 0926)    Principal Problem:   Partial small bowel obstruction Active Problems:   History of cholecystectomy   Hiatal hernia   Bladder mass    Time spent: 35 minutes. Greater than 50% of this time was spent in direct contact with the patient coordinating care.    [facility] Pager [phone]  If 7PM-7AM, please contact night-coverage at  [email or web address], password [facility] 03/05/2014, 10:18 AM  LOS: 2 days

## 2014-03-06 ENCOUNTER — Encounter (HOSPITAL_COMMUNITY): Payer: Self-pay | Admitting: Certified Registered Nurse Anesthetist

## 2014-03-06 ENCOUNTER — Inpatient Hospital Stay (HOSPITAL_COMMUNITY): Payer: Medicare Other | Admitting: Certified Registered Nurse Anesthetist

## 2014-03-06 ENCOUNTER — Encounter (HOSPITAL_COMMUNITY): Payer: Medicare Other | Admitting: Certified Registered Nurse Anesthetist

## 2014-03-06 ENCOUNTER — Encounter (HOSPITAL_COMMUNITY): Admission: EM | Disposition: A | Discharge status: Home / Self Care | Payer: Self-pay | Source: Home / Self Care

## 2014-03-06 HISTORY — PX: GASTROSTOMY: SHX5249

## 2014-03-06 HISTORY — PX: HIATAL HERNIA REPAIR: SHX195

## 2014-03-06 HISTORY — PX: LAPAROTOMY: SHX154

## 2014-03-06 LAB — BASIC METABOLIC PANEL
BUN: 24 mg/dL — ABNORMAL HIGH (ref 6–23)
CO2: 27 mEq/L (ref 19–32)
Calcium: 9 mg/dL (ref 8.4–10.5)
Chloride: 110 mEq/L (ref 96–112)
Creatinine, Ser: 0.97 mg/dL (ref 0.50–1.35)
GFR calc Af Amer: >90 mL/min (ref >90)
GFR calc non Af Amer: 81 mL/min — ABNORMAL LOW (ref >90)
Glucose, Bld: 99 mg/dL (ref 70–99)
Potassium: 4.2 mEq/L (ref 3.7–5.3)
Sodium: 148 mEq/L — ABNORMAL HIGH (ref 137–147)

## 2014-03-06 LAB — TYPE AND SCREEN
ABO/RH(D): O POS
Antibody Screen: NEGATIVE

## 2014-03-06 LAB — SURGICAL PCR SCREEN
MRSA, PCR: NEGATIVE
Staphylococcus aureus: NEGATIVE

## 2014-03-06 LAB — CBC
HCT: 44.1 % (ref 39.0–52.0)
Hemoglobin: 14.5 g/dL (ref 13.0–17.0)
MCH: 29.4 pg (ref 26.0–34.0)
MCHC: 32.9 g/dL (ref 30.0–36.0)
MCV: 89.5 fL (ref 78.0–100.0)
Platelets: 170 10*3/uL (ref 150–400)
RBC: 4.93 MIL/uL (ref 4.22–5.81)
RDW: 13.6 % (ref 11.5–15.5)
WBC: 5.3 10*3/uL (ref 4.0–10.5)

## 2014-03-06 LAB — PROTIME-INR
INR: 1.1 (ref 0.00–1.49)
Prothrombin Time: 14 seconds (ref 11.6–15.2)

## 2014-03-06 LAB — ABO/RH: ABO/RH(D): O POS

## 2014-03-06 SURGERY — LAPAROTOMY, EXPLORATORY
Anesthesia: General | Site: Abdomen

## 2014-03-06 MED ORDER — DEXAMETHASONE SODIUM PHOSPHATE 4 MG/ML IJ SOLN
INTRAMUSCULAR | Status: AC
  Filled 2014-03-06: qty 2

## 2014-03-06 MED ORDER — LACTATED RINGERS IV SOLN
INTRAVENOUS | Status: DC | PRN
  Administered 2014-03-06 (×2): via INTRAVENOUS

## 2014-03-06 MED ORDER — POVIDONE-IODINE 10 % EX OINT
TOPICAL_OINTMENT | CUTANEOUS | Status: DC | PRN
  Administered 2014-03-06: 1 via TOPICAL

## 2014-03-06 MED ORDER — MIDAZOLAM HCL 2 MG/2ML IJ SOLN
INTRAMUSCULAR | Status: AC
  Filled 2014-03-06: qty 2

## 2014-03-06 MED ORDER — ROCURONIUM BROMIDE 50 MG/5ML IV SOLN
INTRAVENOUS | Status: AC
  Filled 2014-03-06: qty 1

## 2014-03-06 MED ORDER — PROPOFOL 10 MG/ML IV BOLUS
INTRAVENOUS | Status: DC | PRN
  Administered 2014-03-06: 40 mg via INTRAVENOUS
  Administered 2014-03-06: 120 mg via INTRAVENOUS

## 2014-03-06 MED ORDER — HYDRALAZINE HCL 20 MG/ML IJ SOLN: 10.0000 mg | Freq: Four times a day (QID) | INTRAMUSCULAR | Status: DC | PRN

## 2014-03-06 MED ORDER — OXYCODONE HCL 5 MG/5ML PO SOLN: 5.0000 mg | Freq: Once | ORAL | Status: AC | PRN

## 2014-03-06 MED ORDER — HYDROMORPHONE 0.3 MG/ML IV SOLN
INTRAVENOUS | Status: AC
  Filled 2014-03-06: qty 25

## 2014-03-06 MED ORDER — LACTATED RINGERS IV SOLN
INTRAVENOUS | Status: DC | PRN
  Administered 2014-03-06 (×2): via INTRAVENOUS

## 2014-03-06 MED ORDER — HYDROMORPHONE 0.3 MG/ML IV SOLN
INTRAVENOUS | Status: DC
  Administered 2014-03-06: 15:00:00 via INTRAVENOUS
  Administered 2014-03-06: 1.2 mg via INTRAVENOUS
  Administered 2014-03-07 (×3): 0.3 mg via INTRAVENOUS
  Administered 2014-03-07 (×3): 0.6 mg via INTRAVENOUS
  Administered 2014-03-08 (×2): 0.3 mg via INTRAVENOUS

## 2014-03-06 MED ORDER — HYDROMORPHONE HCL PF 1 MG/ML IJ SOLN
0.2500 mg | INTRAMUSCULAR | Status: DC | PRN
  Administered 2014-03-06 (×2): 0.5 mg via INTRAVENOUS

## 2014-03-06 MED ORDER — DIPHENHYDRAMINE HCL 50 MG/ML IJ SOLN
12.5000 mg | Freq: Four times a day (QID) | INTRAMUSCULAR | Status: DC | PRN
Start: 2014-03-06 — End: 2014-03-08

## 2014-03-06 MED ORDER — DEXAMETHASONE SODIUM PHOSPHATE 4 MG/ML IJ SOLN
INTRAMUSCULAR | Status: DC | PRN
  Administered 2014-03-06: 8 mg via INTRAVENOUS

## 2014-03-06 MED ORDER — ACETAMINOPHEN 325 MG PO TABS: 325.0000 mg | ORAL_TABLET | ORAL | Status: DC | PRN

## 2014-03-06 MED ORDER — ROCURONIUM BROMIDE 100 MG/10ML IV SOLN
INTRAVENOUS | Status: DC | PRN
  Administered 2014-03-06 (×3): 10 mg via INTRAVENOUS
  Administered 2014-03-06: 30 mg via INTRAVENOUS

## 2014-03-06 MED ORDER — FENTANYL CITRATE 0.05 MG/ML IJ SOLN
INTRAMUSCULAR | Status: AC
  Filled 2014-03-06: qty 5

## 2014-03-06 MED ORDER — PHENYLEPHRINE 40 MCG/ML (10ML) SYRINGE FOR IV PUSH (FOR BLOOD PRESSURE SUPPORT)
PREFILLED_SYRINGE | INTRAVENOUS | Status: AC
  Filled 2014-03-06: qty 10

## 2014-03-06 MED ORDER — NALOXONE HCL 0.4 MG/ML IJ SOLN: 0.4000 mg | INTRAMUSCULAR | Status: DC | PRN

## 2014-03-06 MED ORDER — LIDOCAINE HCL (CARDIAC) 20 MG/ML IV SOLN
INTRAVENOUS | Status: AC
  Filled 2014-03-06: qty 5

## 2014-03-06 MED ORDER — FENTANYL CITRATE 0.05 MG/ML IJ SOLN
INTRAMUSCULAR | Status: DC | PRN
  Administered 2014-03-06: 150 ug via INTRAVENOUS
  Administered 2014-03-06 (×2): 50 ug via INTRAVENOUS
  Administered 2014-03-06: 100 ug via INTRAVENOUS

## 2014-03-06 MED ORDER — PROPOFOL 10 MG/ML IV BOLUS
INTRAVENOUS | Status: AC
  Filled 2014-03-06: qty 20

## 2014-03-06 MED ORDER — NEOSTIGMINE METHYLSULFATE 10 MG/10ML IV SOLN
INTRAVENOUS | Status: DC | PRN
  Administered 2014-03-06: 4 mg via INTRAVENOUS

## 2014-03-06 MED ORDER — LIDOCAINE HCL (CARDIAC) 20 MG/ML IV SOLN
INTRAVENOUS | Status: DC | PRN
  Administered 2014-03-06: 60 mg via INTRAVENOUS

## 2014-03-06 MED ORDER — LACTATED RINGERS IV SOLN
INTRAVENOUS | Status: DC
Start: 2014-03-06 — End: 2014-03-06
  Administered 2014-03-06: 12:00:00 via INTRAVENOUS

## 2014-03-06 MED ORDER — OXYCODONE HCL 5 MG PO TABS
ORAL_TABLET | ORAL | Status: AC
  Filled 2014-03-06: qty 1

## 2014-03-06 MED ORDER — ALBUMIN HUMAN 5 % IV SOLN
INTRAVENOUS | Status: DC | PRN
  Administered 2014-03-06 (×2): via INTRAVENOUS

## 2014-03-06 MED ORDER — DEXTROSE 5 % IV SOLN
1.0000 g | Freq: Four times a day (QID) | INTRAVENOUS | Status: AC
  Administered 2014-03-06: 1 g via INTRAVENOUS
  Filled 2014-03-06 (×2): qty 1

## 2014-03-06 MED ORDER — SUCCINYLCHOLINE CHLORIDE 20 MG/ML IJ SOLN
INTRAMUSCULAR | Status: DC | PRN
  Administered 2014-03-06: 60 mg via INTRAVENOUS

## 2014-03-06 MED ORDER — ARTIFICIAL TEARS OP OINT
TOPICAL_OINTMENT | OPHTHALMIC | Status: AC
  Filled 2014-03-06: qty 3.5

## 2014-03-06 MED ORDER — ONDANSETRON HCL 4 MG/2ML IJ SOLN
INTRAMUSCULAR | Status: DC | PRN
Start: 2014-03-06 — End: 2014-03-06
  Administered 2014-03-06: 4 mg via INTRAVENOUS

## 2014-03-06 MED ORDER — PHENYLEPHRINE HCL 10 MG/ML IJ SOLN
INTRAMUSCULAR | Status: DC | PRN
  Administered 2014-03-06: 80 ug via INTRAVENOUS
  Administered 2014-03-06 (×2): 40 ug via INTRAVENOUS
  Administered 2014-03-06: 80 ug via INTRAVENOUS
  Administered 2014-03-06: 40 ug via INTRAVENOUS
  Administered 2014-03-06: 80 ug via INTRAVENOUS
  Administered 2014-03-06 (×2): 40 ug via INTRAVENOUS

## 2014-03-06 MED ORDER — ONDANSETRON HCL 4 MG/2ML IJ SOLN: 4.0000 mg | Freq: Four times a day (QID) | INTRAMUSCULAR | Status: DC | PRN

## 2014-03-06 MED ORDER — ARTIFICIAL TEARS OP OINT
TOPICAL_OINTMENT | OPHTHALMIC | Status: DC | PRN
  Administered 2014-03-06: 1 via OPHTHALMIC

## 2014-03-06 MED ORDER — POVIDONE-IODINE 10 % EX OINT
TOPICAL_OINTMENT | CUTANEOUS | Status: AC
  Filled 2014-03-06: qty 28.35

## 2014-03-06 MED ORDER — GLYCOPYRROLATE 0.2 MG/ML IJ SOLN
INTRAMUSCULAR | Status: AC
  Filled 2014-03-06: qty 2

## 2014-03-06 MED ORDER — DEXTROSE 5 % IV SOLN: INTRAVENOUS | Status: DC

## 2014-03-06 MED ORDER — GLYCOPYRROLATE 0.2 MG/ML IJ SOLN
INTRAMUSCULAR | Status: DC | PRN
  Administered 2014-03-06: 0.6 mg via INTRAVENOUS

## 2014-03-06 MED ORDER — ONDANSETRON HCL 4 MG/2ML IJ SOLN
INTRAMUSCULAR | Status: AC
  Filled 2014-03-06: qty 2

## 2014-03-06 MED ORDER — SODIUM CHLORIDE 0.9 % IJ SOLN: 9.0000 mL | INTRAMUSCULAR | Status: DC | PRN

## 2014-03-06 MED ORDER — DIPHENHYDRAMINE HCL 12.5 MG/5ML PO ELIX
12.5000 mg | ORAL_SOLUTION | Freq: Four times a day (QID) | ORAL | Status: DC | PRN
  Filled 2014-03-06: qty 5

## 2014-03-06 MED ORDER — METOPROLOL TARTRATE 1 MG/ML IV SOLN: 5.0000 mg | INTRAVENOUS | Status: DC | PRN

## 2014-03-06 MED ORDER — 0.9 % SODIUM CHLORIDE (POUR BTL) OPTIME
TOPICAL | Status: DC | PRN
  Administered 2014-03-06 (×3): 1000 mL

## 2014-03-06 MED ORDER — HYDROMORPHONE HCL PF 1 MG/ML IJ SOLN
INTRAMUSCULAR | Status: AC
  Filled 2014-03-06: qty 1

## 2014-03-06 MED ORDER — OXYCODONE HCL 5 MG PO TABS
5.0000 mg | ORAL_TABLET | Freq: Once | ORAL | Status: AC | PRN
  Administered 2014-03-06: 5 mg via ORAL

## 2014-03-06 MED ORDER — KCL IN DEXTROSE-NACL 20-5-0.45 MEQ/L-%-% IV SOLN
INTRAVENOUS | Status: DC
  Administered 2014-03-06: 1000 mL via INTRAVENOUS
  Administered 2014-03-07 – 2014-03-08 (×3): via INTRAVENOUS
  Filled 2014-03-06 (×9): qty 1000

## 2014-03-06 MED ORDER — PROMETHAZINE HCL 25 MG/ML IJ SOLN: 6.2500 mg | INTRAMUSCULAR | Status: DC | PRN

## 2014-03-06 MED ORDER — NEOSTIGMINE METHYLSULFATE 10 MG/10ML IV SOLN
INTRAVENOUS | Status: AC
  Filled 2014-03-06: qty 1

## 2014-03-06 MED ORDER — SUCCINYLCHOLINE CHLORIDE 20 MG/ML IJ SOLN
INTRAMUSCULAR | Status: AC
  Filled 2014-03-06: qty 1

## 2014-03-06 MED ORDER — ACETAMINOPHEN 160 MG/5ML PO SOLN: 325.0000 mg | ORAL | Status: DC | PRN

## 2014-03-06 SURGICAL SUPPLY — 67 items
BAG URINE DRAINAGE (UROLOGICAL SUPPLIES) ×3 IMPLANT
BLADE SURG ROTATE 9660 (MISCELLANEOUS) ×3 IMPLANT
CANISTER SUCTION 2500CC (MISCELLANEOUS) ×6 IMPLANT
CATH MALECOT BARD  24FR (CATHETERS) ×1
CATH MALECOT BARD 24FR (CATHETERS) ×2 IMPLANT
CHLORAPREP W/TINT 26ML (MISCELLANEOUS) ×3 IMPLANT
CLIP TI LARGE 6 (CLIP) IMPLANT
COVER MAYO STAND STRL (DRAPES) ×3 IMPLANT
COVER SURGICAL LIGHT HANDLE (MISCELLANEOUS) ×3 IMPLANT
DRAIN PENROSE 1/2X36 STERILE (WOUND CARE) IMPLANT
DRAPE LAPAROSCOPIC ABDOMINAL (DRAPES) ×3 IMPLANT
DRAPE PROXIMA HALF (DRAPES) ×3 IMPLANT
DRAPE UTILITY 15X26 W/TAPE STR (DRAPE) ×6 IMPLANT
DRAPE WARM FLUID 44X44 (DRAPE) ×3 IMPLANT
DRSG COVADERM 4X8 (GAUZE/BANDAGES/DRESSINGS) ×3 IMPLANT
DRSG OPSITE POSTOP 4X10 (GAUZE/BANDAGES/DRESSINGS) IMPLANT
DRSG OPSITE POSTOP 4X8 (GAUZE/BANDAGES/DRESSINGS) IMPLANT
ELECT BLADE 6.5 EXT (BLADE) ×3 IMPLANT
ELECT CAUTERY BLADE 6.4 (BLADE) ×6 IMPLANT
ELECT REM PT RETURN 9FT ADLT (ELECTROSURGICAL) ×3
ELECTRODE REM PT RTRN 9FT ADLT (ELECTROSURGICAL) ×2 IMPLANT
GLOVE BIO SURGEON STRL SZ7.5 (GLOVE) ×6 IMPLANT
GLOVE BIOGEL PI IND STRL 7.0 (GLOVE) ×2 IMPLANT
GLOVE BIOGEL PI IND STRL 8 (GLOVE) ×2 IMPLANT
GLOVE BIOGEL PI INDICATOR 7.0 (GLOVE) ×1
GLOVE BIOGEL PI INDICATOR 8 (GLOVE) ×1
GLOVE ECLIPSE 7.5 STRL STRAW (GLOVE) ×6 IMPLANT
GLOVE SURG SIGNA 7.5 PF LTX (GLOVE) ×3 IMPLANT
GOWN STRL REUS W/ TWL LRG LVL3 (GOWN DISPOSABLE) ×6 IMPLANT
GOWN STRL REUS W/TWL LRG LVL3 (GOWN DISPOSABLE) ×6
KIT BASIN OR (CUSTOM PROCEDURE TRAY) ×3 IMPLANT
KIT ROOM TURNOVER OR (KITS) ×3 IMPLANT
LIGASURE IMPACT 36 18CM CVD LR (INSTRUMENTS) ×3 IMPLANT
NS IRRIG 1000ML POUR BTL (IV SOLUTION) ×6 IMPLANT
PACK GENERAL/GYN (CUSTOM PROCEDURE TRAY) ×3 IMPLANT
PAD ARMBOARD 7.5X6 YLW CONV (MISCELLANEOUS) ×6 IMPLANT
PENCIL BUTTON HOLSTER BLD 10FT (ELECTRODE) ×3 IMPLANT
PLUG CATH AND CAP STER (CATHETERS) ×3 IMPLANT
SEPRAFILM PROCEDURAL PACK 3X5 (MISCELLANEOUS) IMPLANT
SPECIMEN JAR LARGE (MISCELLANEOUS) ×3 IMPLANT
SPONGE GAUZE 4X4 12PLY (GAUZE/BANDAGES/DRESSINGS) ×3 IMPLANT
SPONGE LAP 18X18 X RAY DECT (DISPOSABLE) ×6 IMPLANT
STAPLER VISISTAT 35W (STAPLE) ×3 IMPLANT
SUCTION POOLE TIP (SUCTIONS) ×3 IMPLANT
SUT ETHILON 2 0 FS 18 (SUTURE) ×6 IMPLANT
SUT NOVA 1 T20/GS 25DT (SUTURE) IMPLANT
SUT PDS AB 1 TP1 54 (SUTURE) ×3 IMPLANT
SUT PDS AB 1 TP1 96 (SUTURE) ×6 IMPLANT
SUT PROLENE 2 0 CT 30 (SUTURE) ×12 IMPLANT
SUT SILK 2 0 SH (SUTURE) ×12 IMPLANT
SUT SILK 2 0 SH CR/8 (SUTURE) ×3 IMPLANT
SUT SILK 2 0 TIES 10X30 (SUTURE) ×3 IMPLANT
SUT SILK 3 0 (SUTURE) ×2
SUT SILK 3 0 SH CR/8 (SUTURE) ×9 IMPLANT
SUT SILK 3 0 TIES 10X30 (SUTURE) ×3 IMPLANT
SUT SILK 3-0 18XBRD TIE 12 (SUTURE) ×2 IMPLANT
SUT VIC AB 1 CT1 18XCR BRD 8 (SUTURE) ×2 IMPLANT
SUT VIC AB 1 CT1 8-18 (SUTURE) ×1
SYRINGE TOOMEY DISP (SYRINGE) ×3 IMPLANT
TOWEL OR 17X24 6PK STRL BLUE (TOWEL DISPOSABLE) ×3 IMPLANT
TOWEL OR 17X26 10 PK STRL BLUE (TOWEL DISPOSABLE) ×3 IMPLANT
TRAY FOLEY CATH 14FRSI W/METER (CATHETERS) ×3 IMPLANT
TRAY FOLEY CATH 16FRSI W/METER (SET/KITS/TRAYS/PACK) IMPLANT
TUBE CONNECTING 12X1/4 (SUCTIONS) IMPLANT
UNDERPAD 30X30 INCONTINENT (UNDERPADS AND DIAPERS) IMPLANT
WATER STERILE IRR 1000ML POUR (IV SOLUTION) ×3 IMPLANT
YANKAUER SUCT BULB TIP NO VENT (SUCTIONS) ×3 IMPLANT

## 2014-03-06 NOTE — H&P (View-Only) (Signed)
Troy Sanchez Apr 20, 1941  [id number].   Primary Care MD: Dr. Thressa Sheller Requesting MD: Dr. Rande Lawman Chief Complaint/Reason for Consult: SBO secondary to hiatal hernia HPI: This is a healthy 73 yo white male who has had a known small hiatal from an endoscopy about 15 years ago by Dr. Velora Heckler. At that time he was noted to have an esophageal stricture. He has been dilated twice. His last dilatation was years ago. No further progression of his hiatal hernia was noted at that time. The patient has not had any intermittent symptoms that he is aware of. He has complained of some gas pain that he'll take a Gas-X for but no other symptoms. This past Saturday the patient developed severe abdominal pain with some nausea. He denies any emesis. He has not had a bowel movement since 01-02-2023 night and has passed only very limited flatus. He presented to [facility] emergency department where he had a CT scan revealing a massive hiatal hernia with his entire stomach and some of his small bowel in his chest with a transition point near the crux of the diaphragm. He has been admitted and treated conservatively. He is not improving with this conservative management and we have been asked to evaluate the patient for further recommendations.  ROS : We see history of present illness otherwise all other systems have been reviewed and are negative.  History reviewed. No pertinent family history.  History reviewed. No pertinent past medical history.  Past Surgical History  Procedure Laterality Date  . Cholecystectomy  2006  . Umbilical hernia repair      Social History:  reports that he has never smoked. He has never used smokeless tobacco. He reports that he drinks alcohol. He reports that he does not use illicit drugs.  Allergies: No Known Allergies  Medications Prior to Admission  Medication Sig Dispense Refill  . cholecalciferol (VITAMIN D) 1000 UNITS tablet Take 1,000 Units by mouth daily.       . Multiple Vitamins-Minerals (MULTIVITAMIN WITH MINERALS) tablet Take 1 tablet by mouth daily.      . Omega-3 Fatty Acids (FISH OIL) 1000 MG CAPS Take 1,000 mg by mouth daily.      . Simethicone (GAS RELIEF EXTRA STRENGTH PO) Take 1 tablet by mouth every hour as needed (gas).        Blood pressure 119/80, pulse 77, temperature 98.2 F (36.8 C), temperature source Oral, resp. rate 18, height $RemoveBe'5\' 10"'ASKesaXMS$  (1.778 m), weight 203 lb 11.3 oz (92.4 kg), SpO2 96.00%. Physical Exam: General: pleasant, WD, WN white male who is laying in bed in NAD HEENT: head is normocephalic, atraumatic.  Sclera are noninjected.  PERRL.  Ears and nose without any masses or lesions.  Mouth is pink and moist Heart: regular, rate, and rhythm.  Normal s1,s2. No obvious murmurs, gallops, or rubs noted.  Palpable radial and pedal pulses bilaterally Lungs: CTAB, no wheezes, rhonchi, or rales noted.  Respiratory effort nonlabored Abd: soft, nontender currently, mild distention of his upper abdomen with some tympany, about 150 cc of bilious output noted in his NG tube. +BS, no masses, hernias, or organomegaly MS: all 4 extremities are symmetrical with no cyanosis, clubbing, or edema. Skin: warm and dry with no masses, lesions, or rashes Psych: A&Ox3 with an appropriate affect.    Results for orders placed during the hospital encounter of 03/03/14 (from the past 48 hour(s))  BASIC METABOLIC PANEL     Status: Abnormal   Collection Time  03/04/14  4:53 AM      Result Value Ref Range   Sodium 142  137 - 147 mEq/L   Potassium 4.4  3.7 - 5.3 mEq/L   Chloride 104  96 - 112 mEq/L   CO2 26  19 - 32 mEq/L   Glucose, Bld 147 (*) 70 - 99 mg/dL   BUN 25 (*) 6 - 23 mg/dL   Comment: DELTA CHECK NOTED   Creatinine, Ser 1.04  0.50 - 1.35 mg/dL   Calcium 9.6  8.4 - 10.5 mg/dL   GFR calc non Af Amer 70 (*) >90 mL/min   GFR calc Af Amer 81 (*) >90 mL/min   Comment: (NOTE)     The eGFR has been calculated using the CKD EPI equation.      This calculation has not been validated in all clinical situations.     eGFR's persistently <90 mL/min signify possible Chronic Kidney     Disease.  CBC     Status: None   Collection Time    03/04/14  4:53 AM      Result Value Ref Range   WBC 7.7  4.0 - 10.5 K/uL   RBC 5.68  4.22 - 5.81 MIL/uL   Hemoglobin 16.8  13.0 - 17.0 g/dL   HCT 50.5  39.0 - 52.0 %   MCV 88.9  78.0 - 100.0 fL   MCH 29.6  26.0 - 34.0 pg   MCHC 33.3  30.0 - 36.0 g/dL   RDW 13.9  11.5 - 15.5 %   Platelets 220  150 - 400 K/uL   Dg Abd 2 Views  03/05/2014   CLINICAL DATA:  Abdominal pain and distention. follow up small bowel obstruction.  EXAM: ABDOMEN - 2 VIEW  COMPARISON:  CT 03/03/2014.  FINDINGS: There is progressive diffuse small bowel distension with multiple air-fluid levels on the erect examination. The colon remains relatively decompressed. The hiatal hernia is incompletely visualized. There is no free intraperitoneal air. Cholecystectomy clips and pelvic phleboliths are stable.  IMPRESSION: Worsening small bowel distension with multiple air-fluid levels consistent with small bowel obstruction. Patient may benefit from NG tube decompression.   Electronically Signed   By: Camie Patience M.D.   On: 03/05/2014 08:16       Assessment/Plan 1. Small bowel obstruction secondary to large hiatal hernia with entirety of stomach and some small intestine in chest  Plan: Given the extent of the patient's hiatal hernia and small bowel obstruction, the patient will likely need to undergo an operation for reduction of his hiatal hernia and repair. The patient does not have any other medical problems. He currently has an NG tube in for decompression. Continue this. He will be n.p.o. after midnight in preparation for definitive surgery tomorrow. We will hold his DVT prophylaxis tomorrow pending surgical intervention. I had a lengthy discussion with the patient and his wife explaining all this. They're very pleasant. They understand  and are agreeable to proceed with this plan. Thank you for this consultation.  [name] 03/05/2014, 1:21 PM Pager: [phone]

## 2014-03-06 NOTE — H&P (View-Only) (Signed)
Patient was minimally symptomatic until Saturday when he developed severe upper abdominal pain.  He now has an NGT.  He has minimal pain currently, but CT clearly shows SB in the hernia.  He needs a hiatal hernia repair, Nissen fundoplication, gastrostomy tube placement.  This will be done tomorrow.   Troy Sanchez, III, MD, FACS [phone]--pager [phone]--office [facility] 

## 2014-03-06 NOTE — Anesthesia Preprocedure Evaluation (Addendum)
Anesthesia Evaluation  Patient identified by MRN, date of birth, ID band Patient awake    Reviewed: Allergy & Precautions, H&P , NPO status , Patient's Chart, lab work & pertinent test results  History of Anesthesia Complications Negative for: history of anesthetic complications  Airway Mallampati: II TM Distance: >3 FB Neck ROM: Full    Dental  (+) Edentulous Upper, Edentulous Lower, Dental Advisory Given   Pulmonary neg pulmonary ROS,  breath sounds clear to auscultation        Cardiovascular negative cardio ROS  Rhythm:Regular     Neuro/Psych negative neurological ROS     GI/Hepatic Neg liver ROS, hiatal hernia,   Endo/Other  negative endocrine ROS  Renal/GU negative Renal ROS     Musculoskeletal negative musculoskeletal ROS (+)   Abdominal   Peds  Hematology negative hematology ROS (+)   Anesthesia Other Findings   Reproductive/Obstetrics                          Anesthesia Physical Anesthesia Plan  ASA: III  Anesthesia Plan: General   Post-op Pain Management:    Induction: Intravenous  Airway Management Planned: Oral ETT  Additional Equipment: None  Intra-op Plan:   Post-operative Plan: Extubation in OR  Informed Consent: I have reviewed the patients History and Physical, chart, labs and discussed the procedure including the risks, benefits and alternatives for the proposed anesthesia with the patient or authorized representative who has indicated his/her understanding and acceptance.   Dental advisory given  Plan Discussed with: Surgeon and CRNA  Anesthesia Plan Comments:        Anesthesia Quick Evaluation

## 2014-03-06 NOTE — Progress Notes (Signed)
Patient Demographics  Troy Sanchez, is a 73 y.o. male, DOB - 01-16-1941, GLO:[id number]  Admit date - 03/03/2014   Admitting Physician Berle Mull, MD  Outpatient Primary MD for the patient is Fanny Skates, MD  LOS - 3   Chief Complaint  Patient presents with  . Abdominal Pain           Subjective:   Troy Sanchez today has, No headache, No chest pain, No abdominal pain - No Nausea, No new weakness tingling or numbness, No Cough - SOB.     Assessment & Plan      1. Small bowel obstruction. Continue on NG tube, general surgery following, IV fluids switch to D5 W. at sodium slightly high, being followed by general surgery and plan is to take him to OR for surgical correction on 03/06/2014.    Cardio-Pulm Risk stratification for surgery and recommendations to minimize the same:-  A.Cardio-Pulmonary Risk -  this patient is a low-moderate for adverse Cardio-Pulmonary  Outcome  from surgery, the risks and benefits were discussed and acceptable to patient and wife  Recommendations for optimizing Cardio-Pulmonary  Risk risk factors  1. Keep SBP<140, HR<85, use Lopressor 5mg  IV q4hrs PRN, or B.Blocker drip PRN. 2. Moniotr I&Os. 3. Minimal sedation and Narcotics. 4. Good pulmunary toilet. 5. PRN Nebs and as needed oxygen to keep Pox>90% 6. Hb>8, transfuse as needed- Lasix 10mg  IV after each unit PRBC Transfused.   B.Bleeding Risk - no previous surgical complications, no easy bruising,     Lab Results  Component Value Date   PLT 170 03/06/2014                  Lab Results  Component Value Date   INR 1.10 03/06/2014      Will request Surgeon to please Order Lovenox/DVT prophylaxis of his/her choice when OK from Surgeon's standpoint post op.      2. Hiatal hernia. Going  to the OR today.      3. Bladder mass noted on CT scan. Will need outpatient urology followup post discharge.      4. Mild hyponatremia. Change fluids to D5W and monitor.      Code Status: full  Family Communication: wife 03-06-14  Disposition Plan: Home   Procedures CT abdomen pelvis, going to OR on 03/06/2014   Consults  CCS   Medications  Scheduled Meds: . [MAR HOLD] cefOXitin  2 g Intravenous On Call to OR  . [MAR HOLD] enoxaparin (LOVENOX) injection  40 mg Subcutaneous Q24H  . [MAR HOLD] famotidine (PEPCID) IV  20 mg Intravenous Q12H   Continuous Infusions: . dextrose    . lactated ringers 50 mL/hr at 03/06/14 1153   PRN Meds:.[facility] HOLD]  morphine injection, [MAR HOLD] ondansetron (ZOFRAN) IV, [MAR HOLD] ondansetron  DVT Prophylaxis    SCDs  Lovenox  Lab Results  Component Value Date   PLT 170 03/06/2014    Antibiotics   Anti-infectives   Start     Dose/Rate Route Frequency Ordered Stop   03/06/14 0600  [MAR Hold]  cefOXitin (MEFOXIN) 2 g in dextrose 5 % 50 mL IVPB     (On MAR Hold since 03/06/14 1127)   2 g 100 mL/hr over 30 Minutes Intravenous  On call to O.R. 03/05/14 1319 03/07/14 0559          Objective:   Filed Vitals:   03/05/14 0820 03/05/14 1410 03/05/14 1949 03/06/14 0619  BP: 119/80 127/83 116/77 122/83  Pulse: 77 82 77 62  Temp: 98.2 F (36.8 C) 98.7 F (37.1 C) 99 F (37.2 C) 98.5 F (36.9 C)  TempSrc: Oral Oral Oral Oral  Resp: 18 16 16 16   Height:      Weight:    92.1 kg (203 lb 0.7 oz)  SpO2: 96% 95% 95% 95%    Wt Readings from Last 3 Encounters:  03/06/14 92.1 kg (203 lb 0.7 oz)  03/06/14 92.1 kg (203 lb 0.7 oz)     Intake/Output Summary (Last 24 hours) at 03/06/14 1249 Last data filed at 03/06/14 0600  Gross per 24 hour  Intake   2655 ml  Output    300 ml  Net   2355 ml     Physical Exam  Awake Alert, Oriented X 3, No new F.N deficits, Normal affect [place].AT,PERRAL Supple Neck,No JVD, No cervical  lymphadenopathy appriciated.  Symmetrical Chest wall movement, Good air movement bilaterally, CTAB RRR,No Gallops,Rubs or new Murmurs, No Parasternal Heave hypoactive B.Sounds, Abd Soft, No tenderness, No organomegaly appriciated, No rebound - guarding or rigidity. NG tube in place No Cyanosis, Clubbing or edema, No new Rash or bruise    Data Review   Micro Results Recent Results (from the past 240 hour(s))  SURGICAL PCR SCREEN     Status: None   Collection Time    03/06/14  5:03 AM      Result Value Ref Range Status   MRSA, PCR NEGATIVE  NEGATIVE Final   Staphylococcus aureus NEGATIVE  NEGATIVE Final   Comment:            The Xpert SA Assay (FDA     approved for NASAL specimens     in patients over 81 years of age),     is one component of     a comprehensive surveillance     program.  Test performance has     been validated by [employer] for patients greater     than or equal to 37 year old.     It is not intended     to diagnose infection nor to     guide or monitor treatment.    Radiology Reports Ct Abdomen Pelvis W Contrast  03/03/2014   CLINICAL DATA:  Epigastric pain with nausea.  EXAM: CT ABDOMEN AND PELVIS WITH CONTRAST  TECHNIQUE: Multidetector CT imaging of the abdomen and pelvis was performed using the standard protocol following bolus administration of intravenous contrast.  CONTRAST:  148mL OMNIPAQUE IOHEXOL 300 MG/ML SOLN, 11mL OMNIPAQUE IOHEXOL 300 MG/ML SOLN  COMPARISON:  None.  FINDINGS: Massive hiatal hernia, containing the entire stomach, multiple loops of fluid filled mildly distended small bowel with transition point along the left crus of the diaphragm, coronal 48/96. Small bowel within the hernia sac measures up to 3.4 cm in transaxial dimension. Hernia sac resultant compressive atelectasis. Mild mass effect on the left heart border, due to the hernia sac.  The liver, spleen, and adrenal glands are unremarkable. Status post cholecystectomy. A few  punctate calcifications within the pancreatic head, without discrete mass.  The large bowel are normal in course and caliber without inflammatory changes. A few colonic diverticula with mild amount of retained large bowel stool. Normal appendix. No intraperitoneal  free fluid nor free air.  Kidneys are orthotopic, demonstrating symmetric enhancement without hydronephrosis or renal masses. Bilateral nonobstructing nephrolithiasis measure up to 3 mm. The unopacified ureters are normal in course and caliber. Delayed imaging through the kidneys demonstrates symmetric prompt excretion to the proximal urinary collecting system. Urinary bladder is well distended, dense. 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  Aortoiliac vessels are normal in course and caliber with trace calcific atherosclerosis. No lymphadenopathy by CT size criteria. Internal reproductive organs are unremarkable. The soft tissues and included osseous structures are nonsuspicious. Small fat containing umbilical hernia.  IMPRESSION: Massive hiatal hernia, containing the entire stomach, and associated partial small bowel obstruction, with transition point in the left upper quadrant, along the crus of the left diaphragm.  Bilateral nonobstructing nephrolithiasis.  2.5 cm mass at bladder trigone likely reflects prostatic invasion with associated prostatomegaly though, this less likely reflects hematoma.   Electronically Signed   By: Elon Alas   On: 03/03/2014 05:48   Dg Abd 2 Views  03/05/2014   CLINICAL DATA:  Abdominal pain and distention. follow up small bowel obstruction.  EXAM: ABDOMEN - 2 VIEW  COMPARISON:  CT 03/03/2014.  FINDINGS: There is progressive diffuse small bowel distension with multiple air-fluid levels on the erect examination. The colon remains relatively decompressed. The hiatal hernia is incompletely visualized. There is no free intraperitoneal air.  Cholecystectomy clips and pelvic phleboliths are stable.  IMPRESSION: Worsening small bowel distension with multiple air-fluid levels consistent with small bowel obstruction. Patient may benefit from NG tube decompression.   Electronically Signed   By: Camie Patience M.D.   On: 03/05/2014 08:16    CBC  Recent Labs Lab 03/02/14 2317 03/04/14 0453 03/06/14 0822  WBC 7.7 7.7 5.3  HGB 15.6 16.8 14.5  HCT 44.4 50.5 44.1  PLT 193 220 170  MCV 86.9 88.9 89.5  MCH 30.5 29.6 29.4  MCHC 35.1 33.3 32.9  RDW 13.3 13.9 13.6  LYMPHSABS 0.8  --   --   MONOABS 0.2  --   --   EOSABS 0.0  --   --   BASOSABS 0.0  --   --     Chemistries   Recent Labs Lab 03/02/14 2317 03/03/14 0225 03/04/14 0453 03/06/14 0720  NA 140  --  142 148*  K 4.9  --  4.4 4.2  CL 102  --  104 110  CO2 27  --  26 27  GLUCOSE 145*  --  147* 99  BUN 14  --  25* 24*  CREATININE 1.17  --  1.04 0.97  CALCIUM 10.3  --  9.6 9.0  AST  --  26  --   --   ALT  --  18  --   --   ALKPHOS  --  63  --   --   BILITOT  --  1.0  --   --    ------------------------------------------------------------------------------------------------------------------ estimated creatinine clearance is 79.6 ml/min (by C-G formula based on Cr of 0.97). ------------------------------------------------------------------------------------------------------------------ No results found for this basename: HGBA1C,  in the last 72 hours ------------------------------------------------------------------------------------------------------------------ No results found for this basename: CHOL, HDL, LDLCALC, TRIG, CHOLHDL, LDLDIRECT,  in the last 72 hours ------------------------------------------------------------------------------------------------------------------ No results found for this basename: TSH, T4TOTAL, FREET3, T3FREE, THYROIDAB,  in the last 72  hours ------------------------------------------------------------------------------------------------------------------ No results found for this basename: VITAMINB12, FOLATE, FERRITIN, TIBC, IRON, RETICCTPCT,  in the last 72 hours  Coagulation profile  Recent Labs Lab 03/06/14 0822  INR 1.10    No results found for this basename: DDIMER,  in the last 72 hours  Cardiac Enzymes No results found for this basename: CK, CKMB, TROPONINI, MYOGLOBIN,  in the last 168 hours ------------------------------------------------------------------------------------------------------------------ No components found with this basename: POCBNP,      Time Spent in minutes   35   Thurnell Lose M.D on 03/06/2014 at 12:49 PM  Between 7am to 7pm - Pager - [phone]  After 7pm go to [email or web address] - password [email or web address]  And look for the night coverage person covering for me after hours  [facility] Office  [phone]   **Disclaimer: This note may have been dictated with voice recognition software. Similar sounding words can inadvertently be transcribed and this note may contain transcription errors which may not have been corrected upon publication of note.**

## 2014-03-06 NOTE — Anesthesia Postprocedure Evaluation (Signed)
  Anesthesia Post-op Note  Patient: Troy Sanchez  Procedure(s) Performed: Procedure(s): EXPLORATORY LAPAROTOMY (N/A) HERNIA REPAIR HIATAL (N/A) GASTROSTOMY (N/A)  Patient Location: PACU  Anesthesia Type:General  Level of Consciousness: awake and alert   Airway and Oxygen Therapy: Patient Spontanous Breathing and Patient connected to nasal cannula oxygen  Post-op Pain: moderate  Post-op Assessment: Post-op Vital signs reviewed, Patient's Cardiovascular Status Stable, Respiratory Function Stable, Patent Airway, No signs of Nausea or vomiting and Pain level controlled  Post-op Vital Signs: Reviewed and stable  Last Vitals:  Filed Vitals:   03/06/14 1542  BP:   Pulse: 69  Temp: 36.5 C  Resp: 24    Complications: No apparent anesthesia complications

## 2014-03-06 NOTE — Progress Notes (Signed)
Patient back from surgery - Exploratory laparotomy; Hiatal Hernia repair; Gastrostomy; alert and oriented, able to verbalize his needs; low pain level. Continue to monitor

## 2014-03-06 NOTE — Op Note (Signed)
OPERATIVE REPORT  DATE OF OPERATION: 03/03/2014 - 03/06/2014  PATIENT:  Troy Sanchez  73 y.o. male  PRE-OPERATIVE DIAGNOSIS:  bowel obstruction  POST-OPERATIVE DIAGNOSIS:  bowel obstruction  PROCEDURE:  Procedure(s): EXPLORATORY LAPAROTOMY HERNIA REPAIR HIATAL GASTROSTOMY  SURGEON:  Surgeon(s): [name], MD Harl Bowie, MD  ASSISTANT: Ninfa Linden, M.D.  ANESTHESIA:   general  EBL: 250 ml  BLOOD ADMINISTERED: none  DRAINS: Urinary Catheter (Foley) and Gastrostomy Tube   SPECIMEN:  No Specimen  COUNTS CORRECT:  YES  PROCEDURE DETAILS: The patient was taken to the operating room and placed on the table in the supine position. After an adequate general endotracheal anesthetic was administered he was prepped and draped in the usual sterile manner exposing his abdomen.  A proper timeout was performed identifying the patient and the procedure to be performed. An upper midline incision was made using a #10 blade and taken down initially to just above the umbilicus and after the case was started we extended below the umbilicus. We taken down to and through the midline fascia using electrocautery. There was a small serosal injury which was oversewn with 3-0 silk pop-off sutures. The patient was placed in reverse Trendelenburg. There was significant number of small bowel loops in the stomach up in the hiatal hernia.  Manual reduction of the herniated contents was performed by the surgeon. The spleen and remaining below the diaphragm with elongated short gastric vessels.  Once the hernia was reduced sac remain in there in the hernia defect. This measured approximately 8-9 cm in total diameter.  Conclusion the diaphragm and anteriorly on the diaphragm identified and with the tract of the way as a dissected out the hernia sac from the mediastinum. This is done primarily with electrocautery and also Metzenbaum scissors. Once the sac was resected we repaired the crus the diaphragm  using 2 horizontal mattress sutures with pledgets of 2-0 Prolene suture. Prior to doing so the esophagus was mobilized circumferentially in the surgical palpate both anterior and posterior vagus nerves. These were retracted out of way as he does repair and removal of the hernia sac.  Once he 2 mattress sutures were placed anteriorly on the crus of diaphragm is felt as although some displaced herniation. The patient's primary complaint was not reflux therefore a Nissen fundoplication was not performed. A Stamm gastrostomy tube was done to bring in the gastrostomy tube out the left upper quadrant of the abdomen. To consider stitches of 2-0 silk were placed on the anterior gastric wall and then a gastrotomy made in the center. The Pershing sutures were used to secure the gastrostomy tubing which is a 24 Architectural technologist tube. It was brought out the left upper quadrant and secured in place with 2-0 nylon externally and interrupted simple stitches of 2-0 silk on the intra-abdominal wall. Once the gastrostomy tube was in place some we ran the small bowel from the ligament of Treitz all the way to the terminal ileum finding there to be no other injuries or areas of obstruction. There've been some bleeding from the area around the spleen but no splenic laceration or capsular tear.  We irrigated with 2 L of saline solution. We closed the midline fascia using continuous looped #1 PDS suture. The skin was closed using stainless steel staples. A sterile dressing was applied. All needle counts, sponge counts, and instrument counts were correct.  PATIENT DISPOSITION:  PACU - hemodynamically stable.   [name] 5/27/20152:56 PM

## 2014-03-06 NOTE — Interval H&P Note (Signed)
History and Physical Interval Note:  Patient had an uneventful evening.  Has no questions about surgery.  Plan for exploratory laparotomy, gastrostomy, Nissen, hiatal hernia repair later today.  03/06/2014 8:15 AM  Troy Sanchez  has presented today for surgery, with the diagnosis of bowel obstruction  The various methods of treatment have been discussed with the patient and family. After consideration of risks, benefits and other options for treatment, the patient has consented to  Procedure(s): EXPLORATORY LAPAROTOMY (N/A) HERNIA REPAIR HIATAL (N/A) NISSEN FUNDOPLICATION (N/A) GASTROSTOMY (N/A) as a surgical intervention .  The patient's history has been reviewed, patient examined, no change in status, stable for surgery.  I have reviewed the patient's chart and labs.  Questions were answered to the patient's satisfaction.     [name]

## 2014-03-06 NOTE — Transfer of Care (Signed)
Immediate Anesthesia Transfer of Care Note  Patient: Troy Sanchez  Procedure(s) Performed: Procedure(s): EXPLORATORY LAPAROTOMY (N/A) HERNIA REPAIR HIATAL (N/A) GASTROSTOMY (N/A)  Patient Location: PACU  Anesthesia Type:General  Level of Consciousness: awake, alert , oriented and patient cooperative  Airway & Oxygen Therapy: Patient Spontanous Breathing and Patient connected to face mask oxygen  Post-op Assessment: Report given to PACU RN, Post -op Vital signs reviewed and stable and Patient moving all extremities X 4  Post vital signs: Reviewed and stable  Complications: No apparent anesthesia complications

## 2014-03-07 ENCOUNTER — Encounter (HOSPITAL_COMMUNITY): Payer: Self-pay | Admitting: General Surgery

## 2014-03-07 LAB — COMPREHENSIVE METABOLIC PANEL
ALT: 41 U/L (ref 0–53)
AST: 47 U/L — ABNORMAL HIGH (ref 0–37)
Albumin: 2.8 g/dL — ABNORMAL LOW (ref 3.5–5.2)
Alkaline Phosphatase: 44 U/L (ref 39–117)
BUN: 19 mg/dL (ref 6–23)
CO2: 26 mEq/L (ref 19–32)
Calcium: 8.4 mg/dL (ref 8.4–10.5)
Chloride: 110 mEq/L (ref 96–112)
Creatinine, Ser: 1.04 mg/dL (ref 0.50–1.35)
GFR calc Af Amer: 81 mL/min — ABNORMAL LOW (ref >90)
GFR calc non Af Amer: 70 mL/min — ABNORMAL LOW (ref >90)
Glucose, Bld: 141 mg/dL — ABNORMAL HIGH (ref 70–99)
Potassium: 4.6 mEq/L (ref 3.7–5.3)
Sodium: 144 mEq/L (ref 137–147)
Total Bilirubin: 2.4 mg/dL — ABNORMAL HIGH (ref 0.3–1.2)
Total Protein: 5.3 g/dL — ABNORMAL LOW (ref 6.0–8.3)

## 2014-03-07 LAB — CBC
HCT: 42.5 % (ref 39.0–52.0)
Hemoglobin: 14 g/dL (ref 13.0–17.0)
MCH: 29.2 pg (ref 26.0–34.0)
MCHC: 32.9 g/dL (ref 30.0–36.0)
MCV: 88.5 fL (ref 78.0–100.0)
Platelets: 178 10*3/uL (ref 150–400)
RBC: 4.8 MIL/uL (ref 4.22–5.81)
RDW: 13.4 % (ref 11.5–15.5)
WBC: 8.4 10*3/uL (ref 4.0–10.5)

## 2014-03-07 MED ORDER — HEPARIN SODIUM (PORCINE) 5000 UNIT/ML IJ SOLN
5000.0000 [IU] | Freq: Three times a day (TID) | INTRAMUSCULAR | Status: DC
  Administered 2014-03-07 – 2014-03-14 (×21): 5000 [IU] via SUBCUTANEOUS
  Filled 2014-03-07 (×27): qty 1

## 2014-03-07 NOTE — Progress Notes (Signed)
Patient ID: Troy Sanchez, male   DOB: 08-18-1941, 73 y.o.   MRN: [id number]  Subjective: No flatus.  Sore, but overall better.  No n/v.  Objective:  Vital signs:  Filed Vitals:   03/07/14 0400 03/07/14 0445 03/07/14 0501 03/07/14 0800  BP:   124/81   Pulse:   83   Temp:   98.4 F (36.9 C)   TempSrc:   Oral   Resp: $Remo'26  16 26  'RPwRB$ Height:      Weight:  192 lb 0.3 oz (87.1 kg)    SpO2: 92%  93% 92%    Last BM Date: 03/06/14  Intake/Output   Yesterday:  05/27 0701 - 05/28 0700 In: 2700 [I.V.:2200; IV Piggyback:500] Out: 1180 [Urine:680; Blood:500] This shift: I/O last 3 completed shifts: In: 3677.5 [I.V.:3027.5; IV Piggyback:650] Out: 74 [Urine:680; Emesis/NG output:300; Blood:500]    Physical Exam: General: Pt awake/alert/oriented x4 in no acute distress Abdomen: Soft.  mildly distended.  Hypoactive bowel sounds.  Appropriately tender.  Dressing is c/d/i.  G tube in place and placed to gravity, small amount of bilious output.       Problem List:   Principal Problem:   Partial small bowel obstruction Active Problems:   History of cholecystectomy   Hiatal hernia   Bladder mass    Results:   Labs: Results for orders placed during the hospital encounter of 03/03/14 (from the past 43 hour(s))  SURGICAL PCR SCREEN     Status: None   Collection Time    03/06/14  5:03 AM      Result Value Ref Range   MRSA, PCR NEGATIVE  NEGATIVE   Staphylococcus aureus NEGATIVE  NEGATIVE   Comment:            The Xpert SA Assay (FDA     approved for NASAL specimens     in patients over 22 years of age),     is one component of     a comprehensive surveillance     program.  Test performance has     been validated by [employer] for patients greater     than or equal to 4 year old.     It is not intended     to diagnose infection nor to     guide or monitor treatment.  BASIC METABOLIC PANEL     Status: Abnormal   Collection Time    03/06/14  7:20 AM      Result  Value Ref Range   Sodium 148 (*) 137 - 147 mEq/L   Potassium 4.2  3.7 - 5.3 mEq/L   Chloride 110  96 - 112 mEq/L   CO2 27  19 - 32 mEq/L   Glucose, Bld 99  70 - 99 mg/dL   BUN 24 (*) 6 - 23 mg/dL   Creatinine, Ser 0.97  0.50 - 1.35 mg/dL   Calcium 9.0  8.4 - 10.5 mg/dL   GFR calc non Af Amer 81 (*) >90 mL/min   GFR calc Af Amer >90  >90 mL/min   Comment: (NOTE)     The eGFR has been calculated using the CKD EPI equation.     This calculation has not been validated in all clinical situations.     eGFR's persistently <90 mL/min signify possible Chronic Kidney     Disease.  CBC     Status: None   Collection Time    03/06/14  8:22 AM  Result Value Ref Range   WBC 5.3  4.0 - 10.5 K/uL   RBC 4.93  4.22 - 5.81 MIL/uL   Hemoglobin 14.5  13.0 - 17.0 g/dL   HCT 44.1  39.0 - 52.0 %   MCV 89.5  78.0 - 100.0 fL   MCH 29.4  26.0 - 34.0 pg   MCHC 32.9  30.0 - 36.0 g/dL   RDW 13.6  11.5 - 15.5 %   Platelets 170  150 - 400 K/uL  PROTIME-INR     Status: None   Collection Time    03/06/14  8:22 AM      Result Value Ref Range   Prothrombin Time 14.0  11.6 - 15.2 seconds   INR 1.10  0.00 - 1.49  TYPE AND SCREEN     Status: None   Collection Time    03/06/14  1:20 PM      Result Value Ref Range   ABO/RH(D) O POS     Antibody Screen NEG     Sample Expiration 03/09/2014    ABO/RH     Status: None   Collection Time    03/06/14  1:20 PM      Result Value Ref Range   ABO/RH(D) O POS    COMPREHENSIVE METABOLIC PANEL     Status: Abnormal   Collection Time    03/07/14  5:55 AM      Result Value Ref Range   Sodium 144  137 - 147 mEq/L   Potassium 4.6  3.7 - 5.3 mEq/L   Chloride 110  96 - 112 mEq/L   CO2 26  19 - 32 mEq/L   Glucose, Bld 141 (*) 70 - 99 mg/dL   BUN 19  6 - 23 mg/dL   Creatinine, Ser 1.04  0.50 - 1.35 mg/dL   Calcium 8.4  8.4 - 10.5 mg/dL   Total Protein 5.3 (*) 6.0 - 8.3 g/dL   Albumin 2.8 (*) 3.5 - 5.2 g/dL   AST 47 (*) 0 - 37 U/L   ALT 41  0 - 53 U/L   Alkaline  Phosphatase 44  39 - 117 U/L   Total Bilirubin 2.4 (*) 0.3 - 1.2 mg/dL   GFR calc non Af Amer 70 (*) >90 mL/min   GFR calc Af Amer 81 (*) >90 mL/min   Comment: (NOTE)     The eGFR has been calculated using the CKD EPI equation.     This calculation has not been validated in all clinical situations.     eGFR's persistently <90 mL/min signify possible Chronic Kidney     Disease.  CBC     Status: None   Collection Time    03/07/14  5:55 AM      Result Value Ref Range   WBC 8.4  4.0 - 10.5 K/uL   RBC 4.80  4.22 - 5.81 MIL/uL   Hemoglobin 14.0  13.0 - 17.0 g/dL   HCT 42.5  39.0 - 52.0 %   MCV 88.5  78.0 - 100.0 fL   MCH 29.2  26.0 - 34.0 pg   MCHC 32.9  30.0 - 36.0 g/dL   RDW 13.4  11.5 - 15.5 %   Platelets 178  150 - 400 K/uL    Imaging / Studies: No results found.  Scheduled Meds: . famotidine (PEPCID) IV  20 mg Intravenous Q12H  . HYDROmorphone PCA 0.3 mg/mL   Intravenous 6 times per day   Continuous Infusions: . dextrose 5 %  and 0.45 % NaCl with KCl 20 mEq/L 75 mL/hr at 03/07/14 0805   PRN Meds:.diphenhydrAMINE, diphenhydrAMINE, hydrALAZINE, metoprolol, naloxone, ondansetron (ZOFRAN) IV, sodium chloride   Antibiotics: Anti-infectives   Start     Dose/Rate Route Frequency Ordered Stop   03/06/14 1800  cefOXitin (MEFOXIN) 1 g in dextrose 5 % 50 mL IVPB     1 g 100 mL/hr over 30 Minutes Intravenous 4 times per day 03/06/14 1554 03/06/14 1824   03/06/14 0600  [MAR Hold]  cefOXitin (MEFOXIN) 2 g in dextrose 5 % 50 mL IVPB     (On MAR Hold since 03/06/14 1127)   2 g 100 mL/hr over 30 Minutes Intravenous On call to O.R. 03/05/14 1319 03/06/14 1236      Assessment/Plan SBO 2/2 large hiatal hernia Exploratory laparotomy, hiatal hernia repair and G-tube placement ---Dr. Hulen Skains 03/06/14 POD#1 -place G tube to LWIS until bowel function returns -continue with PCA -mobilize -IS(pulling 750) -SCDs -may resume chemical VTE prophylaxis  -IV hydration    Erby Pian,  [facility] Pager [phone] Office [phone]  03/07/2014 9:46 AM

## 2014-03-07 NOTE — Progress Notes (Signed)
Agree with current management.  Troy Sanchez. Dahlia Bailiff, MD, [place] [phone] [facility]

## 2014-03-07 NOTE — Progress Notes (Signed)
Patient Demographics  Troy Sanchez, is a 73 y.o. male, DOB - 1941-04-28, IRC:[id number]  Admit date - 03/03/2014   Admitting Physician Berle Mull, MD  Outpatient Primary MD for the patient is Fanny Skates, MD  LOS - 4   Chief Complaint  Patient presents with  . Abdominal Pain           Subjective:   Troy Sanchez today has, No headache, No chest pain, No abdominal pain - No Nausea, No new weakness tingling or numbness, No Cough - SOB.     Assessment & Plan      1. Small bowel obstruction along with hiatal hernia. Went to or on 03/06/2014 and is status post Exploratory laparotomy, hiatal hernia repair, NISSEN FUNDOPLICATION,  G-tube placement by Dr. Hulen Skains, does not have bowel activity yet, surgery wants to place NG tube again, gentle IV fluids, this problem will be deferred to general surgery for management. Will increase activity continue IS.     2. Hiatal hernia. As above      3. Bladder mass noted on CT scan. Will need outpatient urology followup post discharge.      4. Mild hyponatremia. Resolved after D5W, continue gentle hydration      Code Status: full  Family Communication: wife 03-06-14  Disposition Plan: Home   Procedures CT abdomen pelvis,    by Dr Hulen Skains on 03/06/2014 - Exploratory laparotomy, hiatal hernia repair, NISSEN FUNDOPLICATION,  G-tube placement     Consults  CCS   Medications  Scheduled Meds: . famotidine (PEPCID) IV  20 mg Intravenous Q12H  . heparin subcutaneous  5,000 Units Subcutaneous 3 times per day  . HYDROmorphone PCA 0.3 mg/mL   Intravenous 6 times per day   Continuous Infusions: . dextrose 5 % and 0.45 % NaCl with KCl 20 mEq/L 75 mL/hr at 03/07/14 0805   PRN Meds:.diphenhydrAMINE, diphenhydrAMINE, hydrALAZINE,  metoprolol, naloxone, ondansetron (ZOFRAN) IV, sodium chloride  DVT Prophylaxis    SCDs  Heparin  Lab Results  Component Value Date   PLT 178 03/07/2014    Antibiotics   Anti-infectives   Start     Dose/Rate Route Frequency Ordered Stop   03/06/14 1800  cefOXitin (MEFOXIN) 1 g in dextrose 5 % 50 mL IVPB     1 g 100 mL/hr over 30 Minutes Intravenous 4 times per day 03/06/14 1554 03/06/14 1824   03/06/14 0600  [MAR Hold]  cefOXitin (MEFOXIN) 2 g in dextrose 5 % 50 mL IVPB     (On MAR Hold since 03/06/14 1127)   2 g 100 mL/hr over 30 Minutes Intravenous On call to O.R. 03/05/14 1319 03/06/14 1236          Objective:   Filed Vitals:   03/07/14 0400 03/07/14 0445 03/07/14 0501 03/07/14 0800  BP:   124/81   Pulse:   83   Temp:   98.4 F (36.9 C)   TempSrc:   Oral   Resp: 26  16 26   Height:      Weight:  87.1 kg (192 lb 0.3 oz)    SpO2: 92%  93% 92%    Wt Readings from Last 3 Encounters:  03/07/14 87.1 kg (192 lb 0.3 oz)  03/07/14 87.1 kg (192 lb 0.3  oz)     Intake/Output Summary (Last 24 hours) at 03/07/14 1027 Last data filed at 03/07/14 1007  Gross per 24 hour  Intake   2700 ml  Output   1560 ml  Net   1140 ml     Physical Exam  Awake Alert, Oriented X 3, No new F.N deficits, Normal affect [name].AT,PERRAL Supple Neck,No JVD, No cervical lymphadenopathy appriciated.  Symmetrical Chest wall movement, Good air movement bilaterally, CTAB RRR,No Gallops,Rubs or new Murmurs, No Parasternal Heave hypoactive B.Sounds, Abd Soft, No tenderness, No organomegaly appriciated, No rebound - guarding or rigidity. G tube in place No Cyanosis, Clubbing or edema, No new Rash or bruise    Data Review   Micro Results Recent Results (from the past 240 hour(s))  SURGICAL PCR SCREEN     Status: None   Collection Time    03/06/14  5:03 AM      Result Value Ref Range Status   MRSA, PCR NEGATIVE  NEGATIVE Final   Staphylococcus aureus NEGATIVE  NEGATIVE Final   Comment:             The Xpert SA Assay (FDA     approved for NASAL specimens     in patients over 56 years of age),     is one component of     a comprehensive surveillance     program.  Test performance has     been validated by [employer] for patients greater     than or equal to 75 year old.     It is not intended     to diagnose infection nor to     guide or monitor treatment.    Radiology Reports Ct Abdomen Pelvis W Contrast  03/03/2014   CLINICAL DATA:  Epigastric pain with nausea.  EXAM: CT ABDOMEN AND PELVIS WITH CONTRAST  TECHNIQUE: Multidetector CT imaging of the abdomen and pelvis was performed using the standard protocol following bolus administration of intravenous contrast.  CONTRAST:  151mL OMNIPAQUE IOHEXOL 300 MG/ML SOLN, 52mL OMNIPAQUE IOHEXOL 300 MG/ML SOLN  COMPARISON:  None.  FINDINGS: Massive hiatal hernia, containing the entire stomach, multiple loops of fluid filled mildly distended small bowel with transition point along the left crus of the diaphragm, coronal 48/96. Small bowel within the hernia sac measures up to 3.4 cm in transaxial dimension. Hernia sac resultant compressive atelectasis. Mild mass effect on the left heart border, due to the hernia sac.  The liver, spleen, and adrenal glands are unremarkable. Status post cholecystectomy. A few punctate calcifications within the pancreatic head, without discrete mass.  The large bowel are normal in course and caliber without inflammatory changes. A few colonic diverticula with mild amount of retained large bowel stool. Normal appendix. No intraperitoneal free fluid nor free air.  Kidneys are orthotopic, demonstrating symmetric enhancement without hydronephrosis or renal masses. Bilateral nonobstructing nephrolithiasis measure up to 3 mm. The unopacified ureters are normal in course and caliber. Delayed imaging through the kidneys demonstrates symmetric prompt excretion to the proximal urinary collecting system. Urinary bladder is  well distended, dense. 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  Aortoiliac vessels are normal in course and caliber with trace calcific atherosclerosis. No lymphadenopathy by CT size criteria. Internal reproductive organs are unremarkable. The soft tissues and included osseous structures are nonsuspicious. Small fat containing umbilical hernia.  IMPRESSION: Massive hiatal hernia, containing the entire stomach, and associated  partial small bowel obstruction, with transition point in the left upper quadrant, along the crus of the left diaphragm.  Bilateral nonobstructing nephrolithiasis.  2.5 cm mass at bladder trigone likely reflects prostatic invasion with associated prostatomegaly though, this less likely reflects hematoma.   Electronically Signed   By: Elon Alas   On: 03/03/2014 05:48   Dg Abd 2 Views  03/05/2014   CLINICAL DATA:  Abdominal pain and distention. follow up small bowel obstruction.  EXAM: ABDOMEN - 2 VIEW  COMPARISON:  CT 03/03/2014.  FINDINGS: There is progressive diffuse small bowel distension with multiple air-fluid levels on the erect examination. The colon remains relatively decompressed. The hiatal hernia is incompletely visualized. There is no free intraperitoneal air. Cholecystectomy clips and pelvic phleboliths are stable.  IMPRESSION: Worsening small bowel distension with multiple air-fluid levels consistent with small bowel obstruction. Patient may benefit from NG tube decompression.   Electronically Signed   By: Camie Patience M.D.   On: 03/05/2014 08:16    CBC  Recent Labs Lab 03/02/14 2317 03/04/14 0453 03/06/14 0822 03/07/14 0555  WBC 7.7 7.7 5.3 8.4  HGB 15.6 16.8 14.5 14.0  HCT 44.4 50.5 44.1 42.5  PLT 193 220 170 178  MCV 86.9 88.9 89.5 88.5  MCH 30.5 29.6 29.4 29.2  MCHC 35.1 33.3 32.9 32.9  RDW 13.3 13.9 13.6 13.4  LYMPHSABS 0.8  --   --   --   MONOABS 0.2  --   --   --   EOSABS  0.0  --   --   --   BASOSABS 0.0  --   --   --     Chemistries   Recent Labs Lab 03/02/14 2317 03/03/14 0225 03/04/14 0453 03/06/14 0720 03/07/14 0555  NA 140  --  142 148* 144  K 4.9  --  4.4 4.2 4.6  CL 102  --  104 110 110  CO2 27  --  26 27 26   GLUCOSE 145*  --  147* 99 141*  BUN 14  --  25* 24* 19  CREATININE 1.17  --  1.04 0.97 1.04  CALCIUM 10.3  --  9.6 9.0 8.4  AST  --  26  --   --  47*  ALT  --  18  --   --  41  ALKPHOS  --  63  --   --  44  BILITOT  --  1.0  --   --  2.4*   ------------------------------------------------------------------------------------------------------------------ estimated creatinine clearance is 67.3 ml/min (by C-G formula based on Cr of 1.04). ------------------------------------------------------------------------------------------------------------------ No results found for this basename: HGBA1C,  in the last 72 hours ------------------------------------------------------------------------------------------------------------------ No results found for this basename: CHOL, HDL, LDLCALC, TRIG, CHOLHDL, LDLDIRECT,  in the last 72 hours ------------------------------------------------------------------------------------------------------------------ No results found for this basename: TSH, T4TOTAL, FREET3, T3FREE, THYROIDAB,  in the last 72 hours ------------------------------------------------------------------------------------------------------------------ No results found for this basename: VITAMINB12, FOLATE, FERRITIN, TIBC, IRON, RETICCTPCT,  in the last 72 hours  Coagulation profile  Recent Labs Lab 03/06/14 0822  INR 1.10    No results found for this basename: DDIMER,  in the last 72 hours  Cardiac Enzymes No results found for this basename: CK, CKMB, TROPONINI, MYOGLOBIN,  in the last 168 hours ------------------------------------------------------------------------------------------------------------------ No components  found with this basename: POCBNP,      Time Spent in minutes   35   Thurnell Lose M.D on 03/07/2014 at 10:27 AM  Between 7am to 7pm - Pager -  [phone]  After 7pm go to [email or web address] - password [email or web address]  And look for the night coverage person covering for me after hours  [facility] Office  [phone]   **Disclaimer: This note may have been dictated with voice recognition software. Similar sounding words can inadvertently be transcribed and this note may contain transcription errors which may not have been corrected upon publication of note.**

## 2014-03-08 LAB — HEPATIC FUNCTION PANEL
ALT: 36 U/L (ref 0–53)
AST: 45 U/L — ABNORMAL HIGH (ref 0–37)
Albumin: 2.8 g/dL — ABNORMAL LOW (ref 3.5–5.2)
Alkaline Phosphatase: 52 U/L (ref 39–117)
Bilirubin, Direct: 1.1 mg/dL — ABNORMAL HIGH (ref 0.0–0.3)
Indirect Bilirubin: 1.3 mg/dL — ABNORMAL HIGH (ref 0.3–0.9)
Total Bilirubin: 2.4 mg/dL — ABNORMAL HIGH (ref 0.3–1.2)
Total Protein: 5.7 g/dL — ABNORMAL LOW (ref 6.0–8.3)

## 2014-03-08 LAB — CBC
HCT: 39.8 % (ref 39.0–52.0)
Hemoglobin: 13.2 g/dL (ref 13.0–17.0)
MCH: 29.6 pg (ref 26.0–34.0)
MCHC: 33.2 g/dL (ref 30.0–36.0)
MCV: 89.2 fL (ref 78.0–100.0)
Platelets: 183 10*3/uL (ref 150–400)
RBC: 4.46 MIL/uL (ref 4.22–5.81)
RDW: 13.6 % (ref 11.5–15.5)
WBC: 8.9 10*3/uL (ref 4.0–10.5)

## 2014-03-08 LAB — BASIC METABOLIC PANEL
BUN: 15 mg/dL (ref 6–23)
CO2: 25 mEq/L (ref 19–32)
Calcium: 8.7 mg/dL (ref 8.4–10.5)
Chloride: 108 mEq/L (ref 96–112)
Creatinine, Ser: 0.98 mg/dL (ref 0.50–1.35)
GFR calc Af Amer: >90 mL/min (ref >90)
GFR calc non Af Amer: 81 mL/min — ABNORMAL LOW (ref >90)
Glucose, Bld: 127 mg/dL — ABNORMAL HIGH (ref 70–99)
Potassium: 4.1 mEq/L (ref 3.7–5.3)
Sodium: 145 mEq/L (ref 137–147)

## 2014-03-08 LAB — URINALYSIS W MICROSCOPIC (NOT AT ARMC)
Glucose, UA: NEGATIVE mg/dL
Ketones, ur: 15 mg/dL — AB
Nitrite: POSITIVE — AB
Protein, ur: 30 mg/dL — AB
Specific Gravity, Urine: 1.026 (ref 1.005–1.030)
Urobilinogen, UA: 4 mg/dL — ABNORMAL HIGH (ref 0.0–1.0)
pH: 5.5 (ref 5.0–8.0)

## 2014-03-08 LAB — MAGNESIUM: Magnesium: 2.2 mg/dL (ref 1.5–2.5)

## 2014-03-08 LAB — PHOSPHORUS: Phosphorus: 1.8 mg/dL — ABNORMAL LOW (ref 2.3–4.6)

## 2014-03-08 LAB — CK: Total CK: 1073 U/L — ABNORMAL HIGH (ref 7–232)

## 2014-03-08 MED ORDER — HYDROMORPHONE HCL PF 1 MG/ML IJ SOLN
1.0000 mg | INTRAMUSCULAR | Status: DC | PRN
  Administered 2014-03-08 – 2014-03-12 (×12): 1 mg via INTRAVENOUS
  Filled 2014-03-08 (×12): qty 1

## 2014-03-08 MED ORDER — DEXTROSE-NACL 5-0.9 % IV SOLN
INTRAVENOUS | Status: DC
  Administered 2014-03-08: 125 mL/h via INTRAVENOUS
  Administered 2014-03-09 – 2014-03-12 (×7): via INTRAVENOUS

## 2014-03-08 NOTE — Progress Notes (Signed)
[name] Progress Note 2 Days Post-Op  Subjective: Patient sitting up bathing.  No distress.  Not complaining of any problems.  Objective: Vital signs in last 24 hours: Temp:  [97.4 F (36.3 C)-99.4 F (37.4 C)] 97.4 F (36.3 C) (05/29 0554) Pulse Rate:  [79-101] 101 (05/29 0554) Resp:  [15-26] 22 (05/29 0928) BP: (119-130)/(74-87) 126/87 mmHg (05/29 0554) SpO2:  [92 %-97 %] 97 % (05/29 0928) Last BM Date: 03/06/14  Intake/Output from previous day: 05/28 0701 - 05/29 0700 In: 2537.5 [I.V.:2387.5; IV Piggyback:150] Out: 780 [Urine:750; Drains:30] Intake/Output this shift:    General: No acute distress  Lungs: Clear  Abd: Hypoactive bowel sounds.  G-tube output not much.  Extremities: No changes  Neuro: Intact  Lab Results:  @LABLAST2 (wbc:2,hgb:2,hct:2,plt:2) BMET  Recent Labs  03/07/14 0555 03/08/14 0803  NA 144 145  K 4.6 4.1  CL 110 108  CO2 26 25  GLUCOSE 141* 127*  BUN 19 15  CREATININE 1.04 0.98  CALCIUM 8.4 8.7   PT/INR  Recent Labs  03/06/14 0822  LABPROT 14.0  INR 1.10   ABG No results found for this basename: PHART, PCO2, PO2, HCO3,  in the last 72 hours  Studies/Results: No results found.  Anti-infectives: Anti-infectives   Start     Dose/Rate Route Frequency Ordered Stop   03/06/14 1800  cefOXitin (MEFOXIN) 1 g in dextrose 5 % 50 mL IVPB     1 g 100 mL/hr over 30 Minutes Intravenous 4 times per day 03/06/14 1554 03/06/14 1824   03/06/14 0600  [MAR Hold]  cefOXitin (MEFOXIN) 2 g in dextrose 5 % 50 mL IVPB     (On MAR Hold since 03/06/14 1127)   2 g 100 mL/hr over 30 Minutes Intravenous On call to O.R. 03/05/14 1319 03/06/14 1236      Assessment/Plan: s/p Procedure(s): EXPLORATORY LAPAROTOMY HERNIA REPAIR HIATAL GASTROSTOMY Keep NPO except ice chips for now. If no improvement in bowel function next couple of days, may need to consider TPN.  LOS: 5 days   Troy Sanchez. Dahlia Bailiff, MD,  FACS [phone] [facility] Surgery 03/08/2014

## 2014-03-08 NOTE — Progress Notes (Signed)
Patient transferred to 6N. Handed off to [place], resting comfortably in bed.

## 2014-03-08 NOTE — Care Management Note (Signed)
    Page 1 of 1   03/08/2014     12:34:25 PM CARE MANAGEMENT NOTE 03/08/2014  Patient:  Troy Sanchez, Troy Sanchez   Account Number:  [id number]  Date Initiated:  03/08/2014  Documentation initiated by:  Tomi Bamberger  Subjective/Objective Assessment:   dx sbo  admit- lives with spouse.     Action/Plan:   5/26 exp lap for sbo Arlice Colt repair   Anticipated DC Date:  03/10/2014   Anticipated DC Plan:  [place]  CM consult      Choice offered to / List presented to:             Status of service:  In process, will continue to follow Medicare Important Message given?  YES (If response is "NO", the following Medicare IM given date fields will be blank) Date Medicare IM given:  03/05/2014 Date Additional Medicare IM given:  03/08/2014  Discharge Disposition:    Per UR Regulation:  Reviewed for med. necessity/level of care/duration of stay  If discussed at [place] of Stay Meetings, dates discussed:    Comments:  03/08/14 [place], BSN [phone] patient s/p exp lap, conts to be npo.  IM given 5/26 and additional 5/29.

## 2014-03-08 NOTE — Progress Notes (Signed)
Patient ID: Troy Sanchez, male   DOB: Aug 29, 1941, 73 y.o.   MRN: [id number]  Subjective: No flatus. No n/v.  Ambulating in hallways.  UOP 743m yesterday.  Denies cp, sob.     Objective:  Vital signs:  Filed Vitals:   03/08/14 0400 03/08/14 0554 03/08/14 0800 03/08/14 0928  BP:  126/87    Pulse:  101    Temp:  97.4 F (36.3 C)    TempSrc:  Oral    Resp: _0 Height:      Weight:      SpO2: 96% 97% 97% 97%    Last BM Date: 03/06/14  Intake/Output   Yesterday:  05/28 0701 - 05/29 0700 In: 2537.5 [I.V.:2387.5; IV Piggyback:150] Out: 780 [Urine:750; Drains:30] This shift:  Total I/O In: -  Out: 400 [Urine:400]  Physical Exam: General: Pt awake/alert/oriented x4 in noacute distress Chest: cta.  No chest wall pain w good excursion CV:  Pulses intact.  Regular rhythm Abdomen: Soft.  Mildly distended. Appropriately tender.  Dressing removed, midline incision intact, staples in place.  No evidence of peritonitis.  No incarcerated hernias. GU: cola colored urine.   Ext:  SCDs BLE.  No mjr edema.  No cyanosis Skin: trace pedal edema   Problem List:   Principal Problem:   Partial small bowel obstruction Active Problems:   History of cholecystectomy   Hiatal hernia   Bladder mass    Results:   Labs: Results for orders placed during the hospital encounter of 03/03/14 (from the past 48 hour(s))  TYPE AND SCREEN     Status: None   Collection Time    03/06/14  1:20 PM      Result Value Ref Range   ABO/RH(D) O POS     Antibody Screen NEG     Sample Expiration 03/09/2014    ABO/RH     Status: None   Collection Time    03/06/14  1:20 PM      Result Value Ref Range   ABO/RH(D) O POS    COMPREHENSIVE METABOLIC PANEL     Status: Abnormal   Collection Time    03/07/14  5:55 AM      Result Value Ref Range   Sodium 144  137 - 147 mEq/L   Potassium 4.6  3.7 - 5.3 mEq/L   Chloride 110  96 - 112 mEq/L   CO2 26  19 - 32 mEq/L   Glucose, Bld 141 (*) 70 - 99  mg/dL   BUN 19  6 - 23 mg/dL   Creatinine, Ser 1.04  0.50 - 1.35 mg/dL   Calcium 8.4  8.4 - 10.5 mg/dL   Total Protein 5.3 (*) 6.0 - 8.3 g/dL   Albumin 2.8 (*) 3.5 - 5.2 g/dL   AST 47 (*) 0 - 37 U/L   ALT 41  0 - 53 U/L   Alkaline Phosphatase 44  39 - 117 U/L   Total Bilirubin 2.4 (*) 0.3 - 1.2 mg/dL   GFR calc non Af Amer 70 (*) >90 mL/min   GFR calc Af Amer 81 (*) >90 mL/min   Comment: (NOTE)     The eGFR has been calculated using the CKD EPI equation.     This calculation has not been validated in all clinical situations.     eGFR's persistently <90 mL/min signify possible Chronic Kidney     Disease.  CBC     Status: None   Collection Time  03/07/14  5:55 AM      Result Value Ref Range   WBC 8.4  4.0 - 10.5 K/uL   RBC 4.80  4.22 - 5.81 MIL/uL   Hemoglobin 14.0  13.0 - 17.0 g/dL   HCT 42.5  39.0 - 52.0 %   MCV 88.5  78.0 - 100.0 fL   MCH 29.2  26.0 - 34.0 pg   MCHC 32.9  30.0 - 36.0 g/dL   RDW 13.4  11.5 - 15.5 %   Platelets 178  150 - 400 K/uL  CBC     Status: None   Collection Time    03/08/14  8:03 AM      Result Value Ref Range   WBC 8.9  4.0 - 10.5 K/uL   RBC 4.46  4.22 - 5.81 MIL/uL   Hemoglobin 13.2  13.0 - 17.0 g/dL   HCT 39.8  39.0 - 52.0 %   MCV 89.2  78.0 - 100.0 fL   MCH 29.6  26.0 - 34.0 pg   MCHC 33.2  30.0 - 36.0 g/dL   RDW 13.6  11.5 - 15.5 %   Platelets 183  150 - 400 K/uL  BASIC METABOLIC PANEL     Status: Abnormal   Collection Time    03/08/14  8:03 AM      Result Value Ref Range   Sodium 145  137 - 147 mEq/L   Potassium 4.1  3.7 - 5.3 mEq/L   Chloride 108  96 - 112 mEq/L   CO2 25  19 - 32 mEq/L   Glucose, Bld 127 (*) 70 - 99 mg/dL   BUN 15  6 - 23 mg/dL   Creatinine, Ser 0.98  0.50 - 1.35 mg/dL   Calcium 8.7  8.4 - 10.5 mg/dL   GFR calc non Af Amer 81 (*) >90 mL/min   GFR calc Af Amer >90  >90 mL/min   Comment: (NOTE)     The eGFR has been calculated using the CKD EPI equation.     This calculation has not been validated in all  clinical situations.     eGFR's persistently <90 mL/min signify possible Chronic Kidney     Disease.  MAGNESIUM     Status: None   Collection Time    03/08/14  8:03 AM      Result Value Ref Range   Magnesium 2.2  1.5 - 2.5 mg/dL  PHOSPHORUS     Status: Abnormal   Collection Time    03/08/14  8:03 AM      Result Value Ref Range   Phosphorus 1.8 (*) 2.3 - 4.6 mg/dL    Imaging / Studies: No results found.  Scheduled Meds: . famotidine (PEPCID) IV  20 mg Intravenous Q12H  . heparin subcutaneous  5,000 Units Subcutaneous 3 times per day   Continuous Infusions: . dextrose 5 % and 0.9% NaCl     PRN Meds:.hydrALAZINE, HYDROmorphone (DILAUDID) injection, metoprolol   Antibiotics: Anti-infectives   Start     Dose/Rate Route Frequency Ordered Stop   03/06/14 1800  cefOXitin (MEFOXIN) 1 g in dextrose 5 % 50 mL IVPB     1 g 100 mL/hr over 30 Minutes Intravenous 4 times per day 03/06/14 1554 03/06/14 1824   03/06/14 0600  [MAR Hold]  cefOXitin (MEFOXIN) 2 g in dextrose 5 % 50 mL IVPB     (On MAR Hold since 03/06/14 1127)   2 g 100 mL/hr over 30 Minutes Intravenous On call to  O.R. 03/05/14 1319 03/06/14 1236       Assessment/Plan  SBO 2/2 large hiatal hernia  Exploratory laparotomy, hiatal hernia repair and G-tube placement ---Dr. Hulen Skains 03/06/14  POD#2  -G tube to LWIS until bowel function returns  -ice chips -DC PCA -mobilize  -IS(pulling 750)  -SCD/heparin -increase IVF and DC KCL in fluids, check UA, CK, LFTs for cola colored urine.  Monitor intake and output.  -transfer to [place], [facility] Surgery Pager [phone] Office [phone]  03/08/2014 10:44 AM

## 2014-03-08 NOTE — Evaluation (Addendum)
Physical Therapy Evaluation Patient Details Name: Troy Sanchez MRN: [id number] DOB: 06/22/1941 Today's Date: 03/08/2014   History of Present Illness  73 y.o. male admitted with partial small bowel obstruction. s/p exploratory lap with hernia repair and gastrostomy.   Clinical Impression  *Pt admitted with partial small bowel obstruction, s/p exploratory lap with hernia repair and gastrostomy*. Pt currently with functional limitations due to the deficits listed below (see PT Problem List).  Pt will benefit from skilled PT to increase their independence and safety with mobility to allow discharge to the venue listed below.   Pt ambulated 300' in hall with RW, no loss of balance. At baseline he is very active and is eager to mobilize, progressing to walking without assistive device. He would benefit from frequent ambulation in the hall and is safe to do so with assist with IV pole and lines from his wife. No post acute PT needed.   **    Follow Up Recommendations No PT follow up    Equipment Recommendations  Rolling walker with 5" wheels    Recommendations for Other Services       Precautions / Restrictions        Mobility  Bed Mobility                  Transfers Overall transfer level: Independent Equipment used: None                Ambulation/Gait Ambulation/Gait assistance: Supervision Ambulation Distance (Feet): 300 Feet Assistive device: Rolling walker (2 wheeled) Gait Pattern/deviations: WFL(Within Functional Limits)   Gait velocity interpretation: Below normal speed for age/gender General Gait Details: steady with RW, normally does not use AD and pt would like to progress to no AD with walking  Stairs            Wheelchair Mobility    Modified Rankin (Stroke Patients Only)       Balance Overall balance assessment: Needs assistance   Sitting balance-Leahy Scale: Normal       Standing balance-Leahy Scale: Good                                Pertinent Vitals/Pain *incision is "not painful, just sore" Pt has PCA but stated he hasn't used it recently SaO2 95% on RA walking, HR 94**    Home Living Family/patient expects to be discharged to:: Private residence Living Arrangements: Spouse/significant other Available Help at Discharge: Available 24 hours/day Type of Home: House Home Access: Stairs to enter Entrance Stairs-Rails: Can reach both;Left;Right Entrance Stairs-Number of Steps: 5 Home Layout: Two level Home Equipment: Grab bars - tub/shower Additional Comments: tub    Prior Function Level of Independence: Independent               Hand Dominance        Extremity/Trunk Assessment   Upper Extremity Assessment: Overall WFL for tasks assessed           Lower Extremity Assessment: Overall WFL for tasks assessed      Cervical / Trunk Assessment: Normal  Communication   Communication: No difficulties  Cognition Arousal/Alertness: Awake/alert Behavior During Therapy: WFL for tasks assessed/performed Overall Cognitive Status: Within Functional Limits for tasks assessed                      General Comments      Exercises        Assessment/Plan  PT Assessment Patient needs continued PT services  PT Diagnosis Difficulty walking   PT Problem List Decreased balance;Decreased mobility  PT Treatment Interventions Gait training;Stair training;Functional mobility training;Therapeutic activities;Patient/family education;Therapeutic exercise   PT Goals (Current goals can be found in the Care Plan section) Acute Rehab PT Goals Patient Stated Goal: to be able to walk his dog PT Goal Formulation: With patient/family Time For Goal Achievement: 03/15/14 Potential to Achieve Goals: Good    Frequency Min 3X/week   Barriers to discharge        Co-evaluation               End of Session   Activity Tolerance: Patient tolerated treatment well Patient left: in  chair;with call bell/phone within reach Nurse Communication: Mobility status         Time: 3568-6168 PT Time Calculation (min): 24 min   Charges:   PT Evaluation $Initial PT Evaluation Tier I: 1 Procedure PT Treatments $Gait Training: 8-22 mins   PT G CodesLucile Crater 03/08/2014, 10:17 AM [phone]

## 2014-03-08 NOTE — Progress Notes (Signed)
Pt. Ambulated around unit twice and tolerated well.  Will continue to monitor. Syliva Overman

## 2014-03-09 LAB — MAGNESIUM: Magnesium: 2.1 mg/dL (ref 1.5–2.5)

## 2014-03-09 LAB — PHOSPHORUS: Phosphorus: 2.2 mg/dL — ABNORMAL LOW (ref 2.3–4.6)

## 2014-03-09 NOTE — Progress Notes (Signed)
Patient ID: Troy Sanchez, male   DOB: May 07, 1941, 73 y.o.   MRN: [id number] [facility] Surgery Progress Note:   3 Days Post-Op  Subjective: Mental status is clear and cooperative.  No specific  Complaints.  No flatus  Objective: Vital signs in last 24 hours: Temp:  [98.5 F (36.9 C)-99.2 F (37.3 C)] 98.7 F (37.1 C) (05/30 0512) Pulse Rate:  [82-95] 92 (05/30 0512) Resp:  [16-22] 17 (05/30 0512) BP: (119-130)/(66-82) 119/68 mmHg (05/30 0512) SpO2:  [90 %-97 %] 92 % (05/30 0512) Weight:  [203 lb 4.2 oz (92.2 kg)] 203 lb 4.2 oz (92.2 kg) (05/29 1547)  Intake/Output from previous day: 05/29 0701 - 05/30 0700 In: 2068.8 [I.V.:2068.8] Out: 910 [Urine:650; Drains:110] Intake/Output this shift:    Physical Exam: Work of breathing is normal.  Incision is clean and without erythema.  G tube in place to intermittent suction with succus in tube.  Pain controlled  Lab Results:  Results for orders placed during the hospital encounter of 03/03/14 (from the past 48 hour(s))  CBC     Status: None   Collection Time    03/08/14  8:03 AM      Result Value Ref Range   WBC 8.9  4.0 - 10.5 K/uL   RBC 4.46  4.22 - 5.81 MIL/uL   Hemoglobin 13.2  13.0 - 17.0 g/dL   HCT 39.8  39.0 - 52.0 %   MCV 89.2  78.0 - 100.0 fL   MCH 29.6  26.0 - 34.0 pg   MCHC 33.2  30.0 - 36.0 g/dL   RDW 13.6  11.5 - 15.5 %   Platelets 183  150 - 400 K/uL  BASIC METABOLIC PANEL     Status: Abnormal   Collection Time    03/08/14  8:03 AM      Result Value Ref Range   Sodium 145  137 - 147 mEq/L   Potassium 4.1  3.7 - 5.3 mEq/L   Chloride 108  96 - 112 mEq/L   CO2 25  19 - 32 mEq/L   Glucose, Bld 127 (*) 70 - 99 mg/dL   BUN 15  6 - 23 mg/dL   Creatinine, Ser 0.98  0.50 - 1.35 mg/dL   Calcium 8.7  8.4 - 10.5 mg/dL   GFR calc non Af Amer 81 (*) >90 mL/min   GFR calc Af Amer >90  >90 mL/min   Comment: (NOTE)     The eGFR has been calculated using the CKD EPI equation.     This calculation has not been  validated in all clinical situations.     eGFR's persistently <90 mL/min signify possible Chronic Kidney     Disease.  MAGNESIUM     Status: None   Collection Time    03/08/14  8:03 AM      Result Value Ref Range   Magnesium 2.2  1.5 - 2.5 mg/dL  PHOSPHORUS     Status: Abnormal   Collection Time    03/08/14  8:03 AM      Result Value Ref Range   Phosphorus 1.8 (*) 2.3 - 4.6 mg/dL  CK     Status: Abnormal   Collection Time    03/08/14  8:03 AM      Result Value Ref Range   Total CK 1073 (*) 7 - 232 U/L  HEPATIC FUNCTION PANEL     Status: Abnormal   Collection Time    03/08/14  8:03 AM  Result Value Ref Range   Total Protein 5.7 (*) 6.0 - 8.3 g/dL   Albumin 2.8 (*) 3.5 - 5.2 g/dL   AST 45 (*) 0 - 37 U/L   ALT 36  0 - 53 U/L   Alkaline Phosphatase 52  39 - 117 U/L   Total Bilirubin 2.4 (*) 0.3 - 1.2 mg/dL   Bilirubin, Direct 1.1 (*) 0.0 - 0.3 mg/dL   Indirect Bilirubin 1.3 (*) 0.3 - 0.9 mg/dL  URINALYSIS W MICROSCOPIC     Status: Abnormal   Collection Time    03/08/14  2:49 PM      Result Value Ref Range   Color, Urine ORANGE (*) YELLOW   Comment: BIOCHEMICALS MAY BE AFFECTED BY COLOR   APPearance CLEAR  CLEAR   Specific Gravity, Urine 1.026  1.005 - 1.030   pH 5.5  5.0 - 8.0   Glucose, UA NEGATIVE  NEGATIVE mg/dL   Hgb urine dipstick SMALL (*) NEGATIVE   Bilirubin Urine MODERATE (*) NEGATIVE   Ketones, ur 15 (*) NEGATIVE mg/dL   Protein, ur 30 (*) NEGATIVE mg/dL   Urobilinogen, UA 4.0 (*) 0.0 - 1.0 mg/dL   Nitrite POSITIVE (*) NEGATIVE   Leukocytes, UA SMALL (*) NEGATIVE   WBC, UA 0-2  <3 WBC/hpf   RBC / HPF 7-10  <3 RBC/hpf   Bacteria, UA RARE  RARE   Squamous Epithelial / LPF FEW (*) RARE   Casts GRANULAR CAST (*) NEGATIVE   Comment: HYALINE CASTS   Urine-Other MUCOUS PRESENT    MAGNESIUM     Status: None   Collection Time    03/09/14  4:38 AM      Result Value Ref Range   Magnesium 2.1  1.5 - 2.5 mg/dL  PHOSPHORUS     Status: Abnormal   Collection Time     03/09/14  4:38 AM      Result Value Ref Range   Phosphorus 2.2 (*) 2.3 - 4.6 mg/dL    Radiology/Results: No results found.  Anti-infectives: Anti-infectives   Start     Dose/Rate Route Frequency Ordered Stop   03/06/14 1800  cefOXitin (MEFOXIN) 1 g in dextrose 5 % 50 mL IVPB     1 g 100 mL/hr over 30 Minutes Intravenous 4 times per day 03/06/14 1554 03/06/14 1824   03/06/14 0600  [MAR Hold]  cefOXitin (MEFOXIN) 2 g in dextrose 5 % 50 mL IVPB     (On MAR Hold since 03/06/14 1127)   2 g 100 mL/hr over 30 Minutes Intravenous On call to O.R. 03/05/14 1319 03/06/14 1236      Assessment/Plan: Problem List: Patient Active Problem List   Diagnosis Date Noted  . Partial small bowel obstruction 03/03/2014  . History of cholecystectomy 03/03/2014  . Hiatal hernia 03/03/2014  . Bladder mass 03/03/2014    No flatus yet.  Receiving ice chips PO-begin clears when passing flatus.   3 Days Post-Op    LOS: 6 days   Matt B. Hassell Done, MD, [facility] [phone] beeper [phone]  03/09/2014 8:45 AM

## 2014-03-09 NOTE — Progress Notes (Signed)
I had seen the patient earlier.  Troy Sanchez. Dahlia Bailiff, MD, [place] [phone] [facility]

## 2014-03-10 LAB — MAGNESIUM: Magnesium: 2 mg/dL (ref 1.5–2.5)

## 2014-03-10 LAB — PHOSPHORUS: Phosphorus: 2.9 mg/dL (ref 2.3–4.6)

## 2014-03-10 NOTE — Progress Notes (Signed)
Patient ID: Troy Sanchez, male   DOB: [birth year], 73 y.o.   MRN: [id number] [place] Progress Note:   4 Days Post-Op  Subjective: Mental status is clear; sitting up in chair Objective: Vital signs in last 24 hours: Temp:  [97.8 F (36.6 C)-98.2 F (36.8 C)] 98.2 F (36.8 C) (05/31 0616) Pulse Rate:  [80-86] 80 (05/31 0616) Resp:  [17-18] 18 (05/31 0616) BP: (117-129)/(73-76) 117/76 mmHg (05/31 0616) SpO2:  [95 %-98 %] 97 % (05/31 0616)  Intake/Output from previous day: 05/30 0701 - 05/31 0700 In: 575 [P.O.:375; IV Piggyback:200] Out: 800 [Drains:800] Intake/Output this shift:    Physical Exam: Work of breathing is not labored.  SHAM feeding is seen as bag is now to gravity drainage.  Flatus this am.    Lab Results:  Results for orders placed during the hospital encounter of 03/03/14 (from the past 48 hour(s))  URINALYSIS W MICROSCOPIC     Status: Abnormal   Collection Time    03/08/14  2:49 PM      Result Value Ref Range   Color, Urine ORANGE (*) YELLOW   Comment: BIOCHEMICALS MAY BE AFFECTED BY COLOR   APPearance CLEAR  CLEAR   Specific Gravity, Urine 1.026  1.005 - 1.030   pH 5.5  5.0 - 8.0   Glucose, UA NEGATIVE  NEGATIVE mg/dL   Hgb urine dipstick SMALL (*) NEGATIVE   Bilirubin Urine MODERATE (*) NEGATIVE   Ketones, ur 15 (*) NEGATIVE mg/dL   Protein, ur 30 (*) NEGATIVE mg/dL   Urobilinogen, UA 4.0 (*) 0.0 - 1.0 mg/dL   Nitrite POSITIVE (*) NEGATIVE   Leukocytes, UA SMALL (*) NEGATIVE   WBC, UA 0-2  <3 WBC/hpf   RBC / HPF 7-10  <3 RBC/hpf   Bacteria, UA RARE  RARE   Squamous Epithelial / LPF FEW (*) RARE   Casts GRANULAR CAST (*) NEGATIVE   Comment: HYALINE CASTS   Urine-Other MUCOUS PRESENT    MAGNESIUM     Status: None   Collection Time    03/09/14  4:38 AM      Result Value Ref Range   Magnesium 2.1  1.5 - 2.5 mg/dL  PHOSPHORUS     Status: Abnormal   Collection Time    03/09/14  4:38 AM      Result Value Ref Range   Phosphorus 2.2 (*)  2.3 - 4.6 mg/dL  MAGNESIUM     Status: None   Collection Time    03/10/14  6:15 AM      Result Value Ref Range   Magnesium 2.0  1.5 - 2.5 mg/dL  PHOSPHORUS     Status: None   Collection Time    03/10/14  6:15 AM      Result Value Ref Range   Phosphorus 2.9  2.3 - 4.6 mg/dL    Radiology/Results: No results found.  Anti-infectives: Anti-infectives   Start     Dose/Rate Route Frequency Ordered Stop   03/06/14 1800  cefOXitin (MEFOXIN) 1 g in dextrose 5 % 50 mL IVPB     1 g 100 mL/hr over 30 Minutes Intravenous 4 times per day 03/06/14 1554 03/06/14 1824   03/06/14 0600  [MAR Hold]  cefOXitin (MEFOXIN) 2 g in dextrose 5 % 50 mL IVPB     (On MAR Hold since 03/06/14 1127)   2 g 100 mL/hr over 30 Minutes Intravenous On call to O.R. 03/05/14 1319 03/06/14 1236      Assessment/Plan: Problem  List: Patient Active Problem List   Diagnosis Date Noted  . Partial small bowel obstruction 03/03/2014  . History of cholecystectomy 03/03/2014  . Hiatal hernia 03/03/2014  . Bladder mass 03/03/2014    Doing well.  Ileus resolving.  Continue clears PO and observe G tube drainage as it appears to be decreasing as ileus resolves.   4 Days Post-Op    LOS: 7 days   Matt B. Hassell Done, MD, [facility] [phone] beeper [phone]  03/10/2014 8:10 AM

## 2014-03-11 LAB — MAGNESIUM: Magnesium: 2 mg/dL (ref 1.5–2.5)

## 2014-03-11 LAB — PHOSPHORUS: Phosphorus: 3 mg/dL (ref 2.3–4.6)

## 2014-03-11 NOTE — Progress Notes (Signed)
Physical Therapy Treatment Patient Details Name: Troy Sanchez MRN: [id number] DOB: July 02, 1941 Today's Date: 03/11/2014    History of Present Illness 73 y.o. male admitted with partial small bowel obstruction. s/p exploratory lap with hernia repair and gastrostomy.     PT Comments    Has progressed to Independent level and remains because ileus is resolving and pt not yet on solids.  No further PT needs as pt is self mobilizing in the halls.  Follow Up Recommendations  No PT follow up     Equipment Recommendations  None recommended by PT    Recommendations for Other Services       Precautions / Restrictions      Mobility  Bed Mobility Overal bed mobility:  (Independent per pt.)                Transfers Overall transfer level: Independent Equipment used: None             General transfer comment: can manage the IV pole and transfer Independently  Ambulation/Gait Ambulation/Gait assistance: Modified independent (Device/Increase time);Independent Ambulation Distance (Feet): 500 Feet Assistive device: None (versus IV pole) Gait Pattern/deviations: WFL(Within Functional Limits) Gait velocity: functional Gait velocity interpretation: at or above normal speed for age/gender General Gait Details: Independent with IV pole management in room and in halls   Stairs Stairs: Yes Stairs assistance: Modified independent (Device/Increase time) Stair Management: One rail Right;Alternating pattern;Forwards Number of Stairs: 6 General stair comments: safe and steady with rail.  Wheelchair Mobility    Modified Rankin (Stroke Patients Only)       Balance Overall balance assessment: No apparent balance deficits (not formally assessed)   Sitting balance-Leahy Scale: Normal       Standing balance-Leahy Scale: Good                      Cognition Arousal/Alertness: Awake/alert Behavior During Therapy: WFL for tasks assessed/performed Overall Cognitive  Status: Within Functional Limits for tasks assessed                      Exercises      General Comments        Pertinent Vitals/Pain     Home Living                      Prior Function            PT Goals (current goals can now be found in the care plan section) Acute Rehab PT Goals Patient Stated Goal: to be able to walk his dog Potential to Achieve Goals: Good Progress towards PT goals: Goals met/education completed, patient discharged from PT    Frequency       PT Plan Discharge plan needs to be updated    Co-evaluation             End of Session   Activity Tolerance: Patient tolerated treatment well Patient left: in chair;with call bell/phone within reach     Time: 1105-1120 PT Time Calculation (min): 15 min  Charges:  $Gait Training: 8-22 mins                    G Codes:      [employer] V 03/11/2014, 11:26 AM 03/11/2014  Donnella Sham, PT [phone]  (pager)

## 2014-03-11 NOTE — Progress Notes (Signed)
I have seen and examined the patient and agree with the assessment and plans.  Latorsha Curling A. Jebediah Macrae  MD, FACS  

## 2014-03-11 NOTE — Progress Notes (Signed)
Patient ID: Troy Sanchez, male   DOB: 18-Jan-1941, 73 y.o.   MRN: [id number] 5 Days Post-Op  Subjective: Pt feels well today.  No nausea.  Passing flatus and had a small BM.  Pain is well controlled.  Objective: Vital signs in last 24 hours: Temp:  [97.9 F (36.6 C)-98.5 F (36.9 C)] 97.9 F (36.6 C) (06/01 0542) Pulse Rate:  [71-74] 71 (06/01 0542) Resp:  [17-20] 20 (06/01 0542) BP: (110-115)/(66-81) 112/81 mmHg (06/01 0542) SpO2:  [95 %-100 %] 95 % (06/01 0542) Last BM Date: 03/10/14  Intake/Output from previous day: 05/31 0701 - 06/01 0700 In: 2551 [P.O.:720; I.V.:1831] Out: 1850 [Drains:1850] Intake/Output this shift:    PE: Abd: soft, appropriately tender, incision c/d/i with staples.  g-tube in place with some bilious output.  No drainage around tube site.  Lab Results:  No results found for this basename: WBC, HGB, HCT, PLT,  in the last 72 hours BMET No results found for this basename: NA, K, CL, CO2, GLUCOSE, BUN, CREATININE, CALCIUM,  in the last 72 hours PT/INR No results found for this basename: LABPROT, INR,  in the last 72 hours CMP     Component Value Date/Time   NA 145 03/08/2014 0803   K 4.1 03/08/2014 0803   CL 108 03/08/2014 0803   CO2 25 03/08/2014 0803   GLUCOSE 127* 03/08/2014 0803   BUN 15 03/08/2014 0803   CREATININE 0.98 03/08/2014 0803   CALCIUM 8.7 03/08/2014 0803   PROT 5.7* 03/08/2014 0803   ALBUMIN 2.8* 03/08/2014 0803   AST 45* 03/08/2014 0803   ALT 36 03/08/2014 0803   ALKPHOS 52 03/08/2014 0803   BILITOT 2.4* 03/08/2014 0803   GFRNONAA 81* 03/08/2014 0803   GFRAA >90 03/08/2014 0803   Lipase     Component Value Date/Time   LIPASE 22 03/03/2014 0225       Studies/Results: No results found.  Anti-infectives: Anti-infectives   Start     Dose/Rate Route Frequency Ordered Stop   03/06/14 1800  cefOXitin (MEFOXIN) 1 g in dextrose 5 % 50 mL IVPB     1 g 100 mL/hr over 30 Minutes Intravenous 4 times per day 03/06/14 1554 03/06/14 1824   03/06/14 0600  [MAR Hold]  cefOXitin (MEFOXIN) 2 g in dextrose 5 % 50 mL IVPB     (On MAR Hold since 03/06/14 1127)   2 g 100 mL/hr over 30 Minutes Intravenous On call to O.R. 03/05/14 1319 03/06/14 1236       Assessment/Plan  1. POD 5, s/p ex lap with hiatal hernia repair, gastropexy, and placement of g-tube 2. Post op ileus, resolving  Plan: 1. Will clamp g-tube today and continue to try clear liquids with this clamped.  If + n/v, will return g-tube to gravity bag. 2. Cont to mobilize and pulm toilet   LOS: 8 days    [name] 03/11/2014, 8:46 AM Pager: [phone]

## 2014-03-12 LAB — PHOSPHORUS: Phosphorus: 2.8 mg/dL (ref 2.3–4.6)

## 2014-03-12 LAB — MAGNESIUM: Magnesium: 1.8 mg/dL (ref 1.5–2.5)

## 2014-03-12 MED ORDER — FAMOTIDINE 20 MG PO TABS
20.0000 mg | ORAL_TABLET | Freq: Two times a day (BID) | ORAL | Status: DC
  Administered 2014-03-12 – 2014-03-14 (×5): 20 mg via ORAL
  Filled 2014-03-12 (×7): qty 1

## 2014-03-12 MED ORDER — OXYCODONE-ACETAMINOPHEN 5-325 MG PO TABS
1.0000 | ORAL_TABLET | ORAL | Status: DC | PRN
  Administered 2014-03-12: 2 via ORAL
  Filled 2014-03-12: qty 2

## 2014-03-12 NOTE — Progress Notes (Signed)
Patient ID: Troy Sanchez, male   DOB: 14-Jan-1941, 73 y.o.   MRN: [id number] 6 Days Post-Op  Subjective: Pt feels well today.  Tolerating clear liquids with his G-tube clamped well.  + flatus and BM  Objective: Vital signs in last 24 hours: Temp:  [97.8 F (36.6 C)-98.7 F (37.1 C)] 97.8 F (36.6 C) (06/02 0609) Pulse Rate:  [63-72] 63 (06/02 0609) Resp:  [18-20] 20 (06/02 0609) BP: (113-130)/(75-82) 117/75 mmHg (06/02 0609) SpO2:  [95 %-98 %] 95 % (06/02 0609) Weight:  [220 lb 10.9 oz (100.1 kg)] 220 lb 10.9 oz (100.1 kg) (06/02 0609) Last BM Date: 03/10/14  Intake/Output from previous day: 06/01 0701 - 06/02 0700 In: 3414.6 [I.V.:3214.6; IV Piggyback:200] Out: 150 [Drains:150] Intake/Output this shift:    PE: Abd: soft, appropriately tender, +BS, mild distention, incision is c/d/i with staples intact, g-tube in place and clamped off.  Lab Results:  No results found for this basename: WBC, HGB, HCT, PLT,  in the last 72 hours BMET No results found for this basename: NA, K, CL, CO2, GLUCOSE, BUN, CREATININE, CALCIUM,  in the last 72 hours PT/INR No results found for this basename: LABPROT, INR,  in the last 72 hours CMP     Component Value Date/Time   NA 145 03/08/2014 0803   K 4.1 03/08/2014 0803   CL 108 03/08/2014 0803   CO2 25 03/08/2014 0803   GLUCOSE 127* 03/08/2014 0803   BUN 15 03/08/2014 0803   CREATININE 0.98 03/08/2014 0803   CALCIUM 8.7 03/08/2014 0803   PROT 5.7* 03/08/2014 0803   ALBUMIN 2.8* 03/08/2014 0803   AST 45* 03/08/2014 0803   ALT 36 03/08/2014 0803   ALKPHOS 52 03/08/2014 0803   BILITOT 2.4* 03/08/2014 0803   GFRNONAA 81* 03/08/2014 0803   GFRAA >90 03/08/2014 0803   Lipase     Component Value Date/Time   LIPASE 22 03/03/2014 0225       Studies/Results: No results found.  Anti-infectives: Anti-infectives   Start     Dose/Rate Route Frequency Ordered Stop   03/06/14 1800  cefOXitin (MEFOXIN) 1 g in dextrose 5 % 50 mL IVPB     1 g 100 mL/hr  over 30 Minutes Intravenous 4 times per day 03/06/14 1554 03/06/14 1824   03/06/14 0600  [MAR Hold]  cefOXitin (MEFOXIN) 2 g in dextrose 5 % 50 mL IVPB     (On MAR Hold since 03/06/14 1127)   2 g 100 mL/hr over 30 Minutes Intravenous On call to O.R. 03/05/14 1319 03/06/14 1236       Assessment/Plan  1. POD 6, s/p ex lap with hiatal hernia repair, gastropexy, and placement of g-tube  2. Post op ileus, resolving  Plan: 1. Advance to full liquids today and soft diet in the am.  If he tolerates this tomorrow, can likely plan on dc home tomorrow 2. Add oral pain  meds 3. Decrease IVFs to 50cc/hr 4. Mobilize and pulm toilet   LOS: 9 days    [name] 03/12/2014, 7:46 AM Pager: [phone]

## 2014-03-12 NOTE — Progress Notes (Signed)
I have seen and examined the patient and agree with the assessment and plans. Advancing diet.  Possible home tomorrow  Nathaneil Canary A. Ninfa Linden  MD, FACS

## 2014-03-13 ENCOUNTER — Inpatient Hospital Stay (HOSPITAL_COMMUNITY): Payer: Medicare Other

## 2014-03-13 DIAGNOSIS — R0602 Shortness of breath: Secondary | ICD-10-CM

## 2014-03-13 LAB — PHOSPHORUS: Phosphorus: 2.8 mg/dL (ref 2.3–4.6)

## 2014-03-13 LAB — MAGNESIUM: Magnesium: 1.9 mg/dL (ref 1.5–2.5)

## 2014-03-13 MED ORDER — FUROSEMIDE 10 MG/ML IJ SOLN
20.0000 mg | Freq: Once | INTRAMUSCULAR | Status: AC
  Administered 2014-03-13: 20 mg via INTRAVENOUS
  Filled 2014-03-13: qty 2

## 2014-03-13 NOTE — Progress Notes (Signed)
Patient ID: Troy Sanchez, male   DOB: 04/14/1941, 73 y.o.   MRN: [id number] 7 Days Post-Op  Subjective: Pt started feeling slightly SOB last night around 2am. He has continued to feel this way since then and had difficulty sleeping because of this.  Otherwise, he tolerated this full liquids well yesterday.  Did c/o some mild posterior right knee discomfort last night  Objective: Vital signs in last 24 hours: Temp:  [98.1 F (36.7 C)-99.5 F (37.5 C)] 98.1 F (36.7 C) (06/03 0624) Pulse Rate:  [63-64] 63 (06/03 0624) Resp:  [20] 20 (06/03 0624) BP: (133-138)/(73-80) 133/75 mmHg (06/03 0624) SpO2:  [95 %-98 %] 97 % (06/03 0624) Weight:  [220 lb 0.3 oz (99.8 kg)] 220 lb 0.3 oz (99.8 kg) (06/03 0624) Last BM Date: 03/12/14  Intake/Output from previous day: 06/02 0701 - 06/03 0700 In: 1680 [P.O.:880; I.V.:800] Out: -  Intake/Output this shift:    PE: Abd: soft, +BS, incisions c/d/i with staples present, g-tube clamped off and in place Heart: regular Lungs: few rhonchi at left base Ext: no edema or tenderness to bilateral lower extremities.  Negative Homan's sign  Lab Results:  No results found for this basename: WBC, HGB, HCT, PLT,  in the last 72 hours BMET No results found for this basename: NA, K, CL, CO2, GLUCOSE, BUN, CREATININE, CALCIUM,  in the last 72 hours PT/INR No results found for this basename: LABPROT, INR,  in the last 72 hours CMP     Component Value Date/Time   NA 145 03/08/2014 0803   K 4.1 03/08/2014 0803   CL 108 03/08/2014 0803   CO2 25 03/08/2014 0803   GLUCOSE 127* 03/08/2014 0803   BUN 15 03/08/2014 0803   CREATININE 0.98 03/08/2014 0803   CALCIUM 8.7 03/08/2014 0803   PROT 5.7* 03/08/2014 0803   ALBUMIN 2.8* 03/08/2014 0803   AST 45* 03/08/2014 0803   ALT 36 03/08/2014 0803   ALKPHOS 52 03/08/2014 0803   BILITOT 2.4* 03/08/2014 0803   GFRNONAA 81* 03/08/2014 0803   GFRAA >90 03/08/2014 0803   Lipase     Component Value Date/Time   LIPASE 22 03/03/2014  0225       Studies/Results: No results found.  Anti-infectives: Anti-infectives   Start     Dose/Rate Route Frequency Ordered Stop   03/06/14 1800  cefOXitin (MEFOXIN) 1 g in dextrose 5 % 50 mL IVPB     1 g 100 mL/hr over 30 Minutes Intravenous 4 times per day 03/06/14 1554 03/06/14 1824   03/06/14 0600  [MAR Hold]  cefOXitin (MEFOXIN) 2 g in dextrose 5 % 50 mL IVPB     (On MAR Hold since 03/06/14 1127)   2 g 100 mL/hr over 30 Minutes Intravenous On call to O.R. 03/05/14 1319 03/06/14 1236       Assessment/Plan  1. POD 7, s/p ex lap with hiatal hernia repair, gastropexy, and placement of g-tube  2. Post op ileus, resolving 3. Shortness of breath  Plan: 1. Will get a chest x-ray.  ? Volume overload vs atelectasis.  Doubt PE as he is not desating or on oxygen.  He is not tachycardic and essentially denies lower extremity pain. 2. Cont with soft diet today. 3. May still be able to go home later today, but need to evaluate SOB first.  LOS: 10 days    [name] 03/13/2014, 7:49 AM Pager: [phone]

## 2014-03-13 NOTE — Progress Notes (Signed)
I have seen and examined the patient and agree with the assessment and plans. CXR pending.  Will hold on chest CT for now to r/o PE.  Dailey Alberson A. Ninfa Linden  MD, FACS

## 2014-03-14 DIAGNOSIS — M7989 Other specified soft tissue disorders: Secondary | ICD-10-CM

## 2014-03-14 LAB — PHOSPHORUS: Phosphorus: 2.9 mg/dL (ref 2.3–4.6)

## 2014-03-14 LAB — MAGNESIUM: Magnesium: 1.9 mg/dL (ref 1.5–2.5)

## 2014-03-14 LAB — CBC
HCT: 34.6 % — ABNORMAL LOW (ref 39.0–52.0)
Hemoglobin: 12 g/dL — ABNORMAL LOW (ref 13.0–17.0)
MCH: 29.9 pg (ref 26.0–34.0)
MCHC: 34.7 g/dL (ref 30.0–36.0)
MCV: 86.1 fL (ref 78.0–100.0)
Platelets: 243 10*3/uL (ref 150–400)
RBC: 4.02 MIL/uL — ABNORMAL LOW (ref 4.22–5.81)
RDW: 13.6 % (ref 11.5–15.5)
WBC: 7.3 10*3/uL (ref 4.0–10.5)

## 2014-03-14 MED ORDER — OXYCODONE-ACETAMINOPHEN 5-325 MG PO TABS: 1.0000 | ORAL_TABLET | ORAL | Status: DC | PRN

## 2014-03-14 NOTE — Discharge Summary (Signed)
Patient ID: Troy Sanchez MRN: [id number] DOB/AGE: Jul 17, 1941 74 y.o.  Admit date: 03/03/2014 Discharge date: 03/14/2014  Procedures: EXPLORATORY LAPAROTOMY  HERNIA REPAIR HIATAL  GASTROSTOMY  Consults: general surgery  Reason for Admission: Troy Sanchez is a 73 y.o. male with Past medical history of cholecystectomy.  Patient presented with complaints of abdominal pain the mentions that he had an active day throughout the day today and after his days work he started having complaints of pain in his upper stomach. This was followed by nausea. This was followed by dry heaving. The pain was progressively getting worse and therefore the patient was brought into the hospital.  Patient denies any similar episode in the past of the severity of the accident.  He has some on and off nausea but no vomiting.  He has history of cholecystectomy 2011.  No history of small bowel obstruction.  Admission Diagnoses:  1. SBO secondary to hiatal hernia  Hospital Course: The patient was admitted and initially treated conservatively by internal medicine.  He did not improve and general surgery was consulted for further management.  After evaluation, we felt the patient needed an operation to correct his small bowel obstruction due to the significant size and amount of contents in his chest.  He was taken to the OR where he underwent the above procedure.  He tolerated this well.  His NGT was kept in place initially, but he had a g-tube as well so his NGT got pulled quickly.  His G-tube was left to gravity to await resolution of his post op ileus.  On POD 5, he had a small BM and some flatus.  His g-tube was clamped and he was given clear liquids.  He tolerated this and his diet was advanced as tolerated.  On POD 7, he was doing well, but c/o some SOB.  His chest x-ray revealed some vascular congestion.  He was given a dose of lasix.  This improved his SOB.  He also complained of some right posterior knee pain.   Venous duplex scan was obtained and a DVT was rule out prior to discharge home.  On POD 8, the patient was stable surgically and medically for dc home.  His staples were removed prior to dc home.   Discharge Diagnoses:  Principal Problem:   Partial small bowel obstruction Active Problems:   History of cholecystectomy   Hiatal hernia   Bladder mass s/p EXPLORATORY LAPAROTOMY  HERNIA REPAIR HIATAL  GASTROSTOMY   Discharge Medications:   Medication List         cholecalciferol 1000 UNITS tablet  Commonly known as:  VITAMIN D  Take 1,000 Units by mouth daily.     Fish Oil 1000 MG Caps  Take 1,000 mg by mouth daily.     GAS RELIEF EXTRA STRENGTH PO  Take 1 tablet by mouth every hour as needed (gas).     multivitamin with minerals tablet  Take 1 tablet by mouth daily.     oxyCODONE-acetaminophen 5-325 MG per tablet  Commonly known as:  PERCOCET/ROXICET  Take 1-2 tablets by mouth every 4 (four) hours as needed for moderate pain.        Discharge Instructions: Follow-up Information   Follow up with WYATT, Kathryne Eriksson, MD. Schedule an appointment as soon as possible for a visit in 3 weeks.   Specialty:  General Surgery   Contact information:   [address], [place], [place] [address] [phone]  Follow up with [facility], MD. Schedule an appointment as soon as possible for a visit in 2 weeks. (for hospital follow up and for bladder mass)    Specialty:  Family Medicine   Contact information:   [address] [place] [address]       Signed: [name] 03/14/2014, 12:21 PM

## 2014-03-14 NOTE — Progress Notes (Signed)
*  PRELIMINARY RESULTS* Vascular Ultrasound Lower extremity venous duplex has been completed.  Preliminary findings: no evidence of DVT.   Landry Mellow, RDMS, RVT  03/14/2014, 10:40 AM

## 2014-03-14 NOTE — Progress Notes (Signed)
Patient ID: Troy Sanchez, male   DOB: Jun 12, 1941, 73 y.o.   MRN: [id number] 8 Days Post-Op  Subjective: Pt feels much better today.  SOB has resolved.  Eating better  Objective: Vital signs in last 24 hours: Temp:  [98.4 F (36.9 C)-98.7 F (37.1 C)] 98.4 F (36.9 C) (06/04 0511) Pulse Rate:  [65-75] 72 (06/04 0511) Resp:  [18-20] 18 (06/04 0511) BP: (125-138)/(71-79) 125/71 mmHg (06/04 0511) SpO2:  [97 %-98 %] 97 % (06/04 0511) Weight:  [219 lb 5.7 oz (99.5 kg)] 219 lb 5.7 oz (99.5 kg) (06/04 0511) Last BM Date: 03/12/14  Intake/Output from previous day: 06/03 0701 - 06/04 0700 In: 1416.7 [P.O.:1000; I.V.:416.7] Out: -  Intake/Output this shift:    PE: Abd: soft, appropriately tender, g-tube clamped off, incision c/d/i Lungs: CTAB  Lab Results:   Recent Labs  03/14/14 0430  WBC 7.3  HGB 12.0*  HCT 34.6*  PLT 243   BMET No results found for this basename: NA, K, CL, CO2, GLUCOSE, BUN, CREATININE, CALCIUM,  in the last 72 hours PT/INR No results found for this basename: LABPROT, INR,  in the last 72 hours CMP     Component Value Date/Time   NA 145 03/08/2014 0803   K 4.1 03/08/2014 0803   CL 108 03/08/2014 0803   CO2 25 03/08/2014 0803   GLUCOSE 127* 03/08/2014 0803   BUN 15 03/08/2014 0803   CREATININE 0.98 03/08/2014 0803   CALCIUM 8.7 03/08/2014 0803   PROT 5.7* 03/08/2014 0803   ALBUMIN 2.8* 03/08/2014 0803   AST 45* 03/08/2014 0803   ALT 36 03/08/2014 0803   ALKPHOS 52 03/08/2014 0803   BILITOT 2.4* 03/08/2014 0803   GFRNONAA 81* 03/08/2014 0803   GFRAA >90 03/08/2014 0803   Lipase     Component Value Date/Time   LIPASE 22 03/03/2014 0225       Studies/Results: Dg Chest 2 View  03/13/2014   CLINICAL DATA:  Shortness of breath  EXAM: CHEST  2 VIEW  COMPARISON:  None.  FINDINGS: Low lung volumes. Cardiac silhouette within the upper limits normal. Area of increased density left lower lobe. Small left effusion. Mild prominence of the interstitial markings. No  acute osseous abnormalities.  IMPRESSION: Left lower lobe infiltrate and small effusion.  Pulmonary vascular congestion.   Electronically Signed   By: Margaree Mackintosh M.D.   On: 03/13/2014 09:28    Anti-infectives: Anti-infectives   Start     Dose/Rate Route Frequency Ordered Stop   03/06/14 1800  cefOXitin (MEFOXIN) 1 g in dextrose 5 % 50 mL IVPB     1 g 100 mL/hr over 30 Minutes Intravenous 4 times per day 03/06/14 1554 03/06/14 1824   03/06/14 0600  [MAR Hold]  cefOXitin (MEFOXIN) 2 g in dextrose 5 % 50 mL IVPB     (On MAR Hold since 03/06/14 1127)   2 g 100 mL/hr over 30 Minutes Intravenous On call to O.R. 03/05/14 1319 03/06/14 1236       Assessment/Plan 1. POD 8, s/p ex lap with hiatal hernia repair, gastropexy, and placement of g-tube  2. Post op ileus, resolving  3. Shortness of breath, resolved  Plan: 1. Lasix helped significantly with SOB 2. Tolerating solid diet. Gets full earlier than normal, as expected 3. Wait for venous duplex scan, if negative plan for dc home today   LOS: 11 days    [name] 03/14/2014, 8:11 AM Pager: [phone]

## 2014-03-14 NOTE — Progress Notes (Signed)
I have seen and examined the patient and agree with the assessment and plans. Doing much better.  Doubt PE  Troy Sanchez A. Troy Linden  MD, FACS

## 2014-03-14 NOTE — Discharge Instructions (Signed)
[facility]      [facility] °[phone] ° °OPEN ABDOMINAL SURGERY: POST OP INSTRUCTIONS ° °Always review your discharge instruction sheet given to you by the facility where your surgery was performed. ° °IF YOU HAVE DISABILITY OR FAMILY LEAVE FORMS, YOU MUST BRING THEM TO THE OFFICE FOR PROCESSING.  PLEASE DO NOT GIVE THEM TO YOUR DOCTOR. ° °1. A prescription for pain medication may be given to you upon discharge.  Take your pain medication as prescribed, if needed.  If narcotic pain medicine is not needed, then you may take acetaminophen (Tylenol) or ibuprofen (Advil) as needed. °2. Take your usually prescribed medications unless otherwise directed. °3. If you need a refill on your pain medication, please contact your pharmacy. They will contact our office to request authorization.  Prescriptions will not be filled after 5pm or on week-ends. °4. You should follow a light diet the first few days after arrival home, such as soup and crackers, pudding, etc.unless your doctor has advised otherwise. A high-fiber, low fat diet can be resumed as tolerated.   Be sure to include lots of fluids daily. Most patients will experience some swelling and bruising on the chest and neck area.  Ice packs will help.  Swelling and bruising can take several days to resolve °5. Most patients will experience some swelling and bruising in the area of the incision. Ice pack will help. Swelling and bruising can take several days to resolve..  °6. It is common to experience some constipation if taking pain medication after surgery.  Increasing fluid intake and taking a stool softener will usually help or prevent this problem from occurring.  A mild laxative (Milk of Magnesia or Miralax) should be taken according to package directions if there are no bowel movements after 48 hours. °7.  You may have steri-strips (small skin tapes) in place directly over the incision.  These strips should be left on the skin for 7-10 days.  If your  surgeon used skin glue on the incision, you may shower in 24 hours.  The glue will flake off over the next 2-3 weeks.  Any sutures or staples will be removed at the office during your follow-up visit. You may find that a light gauze bandage over your incision may keep your staples from being rubbed or pulled. You may shower and replace the bandage daily. °8. ACTIVITIES:  You may resume regular (light) daily activities beginning the next day--such as daily self-care, walking, climbing stairs--gradually increasing activities as tolerated.  You may have sexual intercourse when it is comfortable.  Refrain from any heavy lifting or straining until approved by your doctor. °a. You may drive when you no longer are taking prescription pain medication, you can comfortably wear a seatbelt, and you can safely maneuver your car and apply brakes °b. Return to Work: ___________________________________ °9. You should see your doctor in the office for a follow-up appointment approximately two weeks after your surgery.  Make sure that you call for this appointment within a day or two after you arrive home to insure a convenient appointment time. °OTHER INSTRUCTIONS:  °_____________________________________________________________ °_____________________________________________________________ ° °WHEN TO CALL YOUR DOCTOR: °1. Fever over 101.0 °2. Inability to urinate °3. Nausea and/or vomiting °4. Extreme swelling or bruising °5. Continued bleeding from incision. °6. Increased pain, redness, or drainage from the incision. °7. Difficulty swallowing or breathing °8. Muscle cramping or spasms. °9. Numbness or tingling in hands or feet or around lips. ° °The clinic staff is available to   answer your questions during regular business hours.  Please dont hesitate to call and ask to speak to one of the nurses if you have concerns.  For further questions, please visit [email or web address]  Gastrostomy Tube Home Guide A  gastrostomy tube is a tube that is surgically placed into the stomach. It is also called a "G-tube." G-tubes are used when a person is unable to eat and drink enough on their own to stay healthy. The tube is inserted into the stomach through a small cut (incision) in the skin. This tube is used for:  Feeding.  Giving medication. GASTROSTOMY TUBE CARE  Wash your hands with soap and water.  Remove the old dressing (if any). Some styles of G-tubes may need a dressing inserted between the skin and the G-tube. Other types of G-tubes do not require a dressing. Ask your health care provider if a dressing is needed.  Check the area where the tube enters the skin (insertion site) for redness, swelling, or pus-like (purulent) drainage. A small amount of clear or tan liquid drainage is normal. Check to make sure scar tissue (skin) is not growing around the insertion site. This could have a raised, bumpy appearance.  A cotton swab can be used to clean the skin around the tube:  When the G-tube is first put in, a normal saline solution or water can be used to clean the skin.  Mild soap and warm water can be used when the skin around the G-tube site has healed.  Roll the cotton swab around the G-tube insertion site to remove any drainage or crusting at the insertion site. G-TUBE PROBLEMS G-tube was pulled out.  Cause: May have been pulled out accidentally.  Solutions: Cover the opening with clean dressing and tape. Call your health care provider right away. The G-tube should be put in as soon as possible (within 4 hours) so the G-tube opening (tract) does not close. The G-tube needs to be put in at a healthcare setting. An X-ray needs to be done to confirm placement before the G-tube can be used again. Redness, irritation, soreness, or foul odor around the gastrostomy site.  Cause: May be caused by leakage or infection.  Solutions: Call your health care provider right away. Large amount of leakage  of fluid or mucus-like liquid present (a large amount means it soaks clothing).  Cause: Many reasons could cause the G-tube to leak.  Solutions: Call your health care provider to discuss the amount of leakage. Skin or scar tissue appears to be growing where tube enters skin.   Cause: Tissue growth may develop around the insertion site if the G-tube is moved or pulled on excessively.  Solutions: Secure tube with tape so that excess movement does not occur. Call your health care provider. G-tube is clogged.  Cause: Thick formula or medication.  Solutions: Try to slowly push warm water into the tube with a large syringe. Never try to push any object into the tube to unclog it. Do not force fluid into the G-tube. If you are unable to unclog the tube, call your health care provider right away. SEEK IMMEDIATE MEDICAL CARE IF:   You have severe abdominal pain, tenderness, or abdominal bloating (distension).  You have nausea or vomiting.  You are constipated or have problems moving your bowels.  The G-tube insertion site is red, swollen, has a foul smell, or has yellow or brown drainage.  You have difficulty breathing or shortness of breath.  You have a fever.  You have a large amount of feeding tube residuals.  The G-tube is clogged and cannot be flushed. MAKE SURE YOU:   Understand these instructions.  Will watch your condition.  Will get help right away if you are not doing well or get worse. Document Released: 12/06/2001 Document Revised: 07/18/2013 Document Reviewed: 06/04/2013 [facility] Patient Information 2014 [place].

## 2014-03-22 ENCOUNTER — Ambulatory Visit (INDEPENDENT_AMBULATORY_CARE_PROVIDER_SITE_OTHER): Payer: Medicare Other | Admitting: General Surgery

## 2014-03-22 ENCOUNTER — Encounter (INDEPENDENT_AMBULATORY_CARE_PROVIDER_SITE_OTHER): Payer: Self-pay | Admitting: General Surgery

## 2014-03-22 VITALS — BP 123/83 | HR 70 | Temp 98.6°F | Resp 14 | Ht 70.5 in | Wt 205.8 lb

## 2014-03-22 DIAGNOSIS — Z09 Encounter for follow-up examination after completed treatment for conditions other than malignant neoplasm: Secondary | ICD-10-CM

## 2014-03-22 MED ORDER — AMOXICILLIN-POT CLAVULANATE 875-125 MG PO TABS: 1.0000 | ORAL_TABLET | Freq: Two times a day (BID) | ORAL | Status: AC

## 2014-03-22 NOTE — Progress Notes (Signed)
Subjective:     Patient ID: Troy Sanchez, male   DOB: 06/06/1941, 73 y.o.   MRN: [id number]  HPI  The patient comes in today 16 days after his hiatal hernia repair and gastropexy with the gastrostomy tube. He is eating fairly well but only small frequent meals. He did not eat his normal amount. He does not tolerate liquids very well but that's always been the case.  Review of Systems Redness on the lateral portion of his incision. No drainage.    Objective:   Physical Exam On the anterior aspect of his midline incision there is a 3-4 cm area of erythema which the wife says has decreased in size. There is no drainage and none could be expressed from the wound. It is mildly tender. The gastrostomy tube site is intact and the sutures in place.    Assessment:     Possible early lower incisional infection. Will treat the patient with Augmentin. This will only be for 5 days.     Plan:     The patient will come back to see me in one month at which time

## 2014-04-16 ENCOUNTER — Encounter (INDEPENDENT_AMBULATORY_CARE_PROVIDER_SITE_OTHER): Payer: Medicare Other | Admitting: General Surgery

## 2014-05-02 ENCOUNTER — Ambulatory Visit (INDEPENDENT_AMBULATORY_CARE_PROVIDER_SITE_OTHER): Payer: Medicare Other | Admitting: General Surgery

## 2014-05-02 ENCOUNTER — Encounter (INDEPENDENT_AMBULATORY_CARE_PROVIDER_SITE_OTHER): Payer: Self-pay | Admitting: General Surgery

## 2014-05-02 VITALS — BP 126/80 | HR 60 | Temp 97.5°F | Ht 70.0 in | Wt 194.0 lb

## 2014-05-02 DIAGNOSIS — Z09 Encounter for follow-up examination after completed treatment for conditions other than malignant neoplasm: Secondary | ICD-10-CM

## 2014-05-02 NOTE — Progress Notes (Signed)
Subjective:     Patient ID: Troy Sanchez, male   DOB: 1941-08-10, 73 y.o.   MRN: [id number]  HPI The patient is doing extremely well. His incision has healed well with no infection.   Review of Systems Noncontributory.    Objective:   Physical Exam His gastrostomy tube is in place without drainage. The suture holding it was cut and the gastrostomy tube was removed without event. There was minimal to no drainage.    Assessment:     Status post gastropexy for incarcerated hiatal hernia.     Plan:     Gastrostomy tube has been removed.  The patient can come and see me on an as-needed basis. The dressing should remain in place for 2 days. He is under no restrictions.

## 2014-08-01 ENCOUNTER — Other Ambulatory Visit: Payer: Self-pay | Admitting: Urology

## 2014-08-01 NOTE — Progress Notes (Signed)
Called Dr. Ralene Muskrat office for orders to be put in [facility] 08-15-14 pre op 08-06-14 Thanks

## 2014-08-02 ENCOUNTER — Encounter (HOSPITAL_COMMUNITY): Payer: Self-pay | Admitting: Pharmacy Technician

## 2014-08-05 NOTE — Patient Instructions (Addendum)
Troy Sanchez  08/05/2014   Your procedure is scheduled on:  08/15/2014    Come thru the [place] Entrance.  .  Follow the Signs to [facility] at [place]       am  Call this number if you have problems the morning of surgery: [phone]   Remember:   Do not eat food or drink liquids after midnight.   Take these medicines the morning of surgery with A SIP OF WATER: Rapaflo, Refresh eye drops if needed   Do not wear jewelry.   Do not wear lotions, powders, or perfumes.  deodorant.  . Men may shave face and neck.  Do not bring valuables to the hospital.  Contacts, dentures or bridgework may not be worn into surgery.  Leave suitcase in the car. After surgery it may be brought to your room.  For patients admitted to the hospital, checkout time is 11:00 AM the day of  discharge.         Please read over the following fact sheets that you were given: , coughing and deep breathing exercises, leg exercises            [facility] - Preparing for Surgery Before surgery, you can play an important role.  Because skin is not sterile, your skin needs to be as free of germs as possible.  You can reduce the number of germs on your skin by washing with CHG (chlorahexidine gluconate) soap before surgery.  CHG is an antiseptic cleaner which kills germs and bonds with the skin to continue killing germs even after washing. Please DO NOT use if you have an allergy to CHG or antibacterial soaps.  If your skin becomes reddened/irritated stop using the CHG and inform your nurse when you arrive at Short Stay. Do not shave (including legs and underarms) for at least 48 hours prior to the first CHG shower.  You may shave your face/neck. Please follow these instructions carefully:  1.  Shower with CHG Soap the night before surgery and the  morning of Surgery.  2.  If you choose to wash your hair, wash your hair first as usual with your  normal  shampoo.  3.  After you shampoo, rinse your hair and body  thoroughly to remove the  shampoo.                           4.  Use CHG as you would any other liquid soap.  You can apply chg directly  to the skin and wash                       Gently with a scrungie or clean washcloth.  5.  Apply the CHG Soap to your body ONLY FROM THE NECK DOWN.   Do not use on face/ open                           Wound or open sores. Avoid contact with eyes, ears mouth and genitals (private parts).                       Wash face,  Genitals (private parts) with your normal soap.             6.  Wash thoroughly, paying special attention to the area where your surgery  will be performed.  7.  Thoroughly rinse your body with warm water from the neck down.  8.  DO NOT shower/wash with your normal soap after using and rinsing off  the CHG Soap.                9.  Pat yourself dry with a clean towel.            10.  Wear clean pajamas.            11.  Place clean sheets on your bed the night of your first shower and do not  sleep with pets. Day of Surgery : Do not apply any lotions/deodorants the morning of surgery.  Please wear clean clothes to the hospital/surgery center.  FAILURE TO FOLLOW THESE INSTRUCTIONS MAY RESULT IN THE CANCELLATION OF YOUR SURGERY PATIENT SIGNATURE_________________________________  NURSE SIGNATURE__________________________________  ________________________________________________________________________

## 2014-08-06 ENCOUNTER — Encounter (HOSPITAL_COMMUNITY)
Admission: RE | Admit: 2014-08-06 | Discharge: 2014-08-06 | Disposition: A | Discharge status: Home / Self Care | Payer: Medicare Other | Source: Ambulatory Visit | Attending: Urology | Admitting: Urology

## 2014-08-06 ENCOUNTER — Encounter (HOSPITAL_COMMUNITY): Payer: Self-pay

## 2014-08-06 DIAGNOSIS — Z01812 Encounter for preprocedural laboratory examination: Secondary | ICD-10-CM | POA: Insufficient documentation

## 2014-08-06 LAB — BASIC METABOLIC PANEL
Anion gap: 9 (ref 5–15)
BUN: 12 mg/dL (ref 6–23)
CO2: 31 mEq/L (ref 19–32)
Calcium: 9.7 mg/dL (ref 8.4–10.5)
Chloride: 101 mEq/L (ref 96–112)
Creatinine, Ser: 1.03 mg/dL (ref 0.50–1.35)
GFR calc Af Amer: 82 mL/min — ABNORMAL LOW (ref >90)
GFR calc non Af Amer: 71 mL/min — ABNORMAL LOW (ref >90)
Glucose, Bld: 89 mg/dL (ref 70–99)
Potassium: 4.7 mEq/L (ref 3.7–5.3)
Sodium: 141 mEq/L (ref 137–147)

## 2014-08-06 LAB — CBC
HCT: 45.1 % (ref 39.0–52.0)
Hemoglobin: 15.1 g/dL (ref 13.0–17.0)
MCH: 29 pg (ref 26.0–34.0)
MCHC: 33.5 g/dL (ref 30.0–36.0)
MCV: 86.6 fL (ref 78.0–100.0)
Platelets: 173 10*3/uL (ref 150–400)
RBC: 5.21 MIL/uL (ref 4.22–5.81)
RDW: 14.2 % (ref 11.5–15.5)
WBC: 4.9 10*3/uL (ref 4.0–10.5)

## 2014-08-14 NOTE — H&P (Signed)
Active Problems Problems  1. 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 Elevated prostate specific antigen (PSA) (790.93) 3. Nephrolithiasis (592.0)  History of Present Illness Troy Sanchez is a 73 yo WM sent in consulation by Dr. Trilby Drummer for a possible bladder mass found on a CT for epigastric pain. The mass was 2.5cm and contiguous with the prostate. He has some voiding symptoms with frequency and hesitancy if he holds too long.  He has nocturia x 2-3x. He has a reduced stream and doesn't feel like he empties unless he sits to void. He has intermittency otherwise. He has had no hematuria or dysuria. He reports his PSA's have been elevated and was 7.7 in April.  He has never had UTI's or GU surgery. He recently had trouble with voiding after taking an allergy tab with near retention.   Past Medical History Problems  1. History of esophageal reflux (V12.79) 2. History of hiatal hernia (V12.79)  Surgical History Problems  1. History of Cholecystectomy 2. History of Esophagogastric Fundoplasty Nissen Fundoplication 3. History of Hernia Repair 4. History of Umbilical Hernia Repair  Current Meds 1. Fish Oil CAPS;  Therapy: (Recorded:02Sep2015) to Recorded 2. Multi-Vitamin TABS;  Therapy: (Recorded:02Sep2015) to Recorded 3. Saw Palmetto TABS;  Therapy: (Recorded:02Sep2015) to Recorded 4. Vitamin D CAPS;  Therapy: (Recorded:02Sep2015) to Recorded  Allergies Medication  1. No Known Drug Allergies  Family History Problems  1. Family history of prostate cancer (V16.42) : Grandparent  Social History Problems    Daily caffeine consumption, 1 serving a day   Father deceased   Married   Mother deceased   Never a smoker   No alcohol use   Occupation   Therapist, art   Two children  Review of Systems Genitourinary, constitutional, skin, eye, otolaryngeal, hematologic/lymphatic, cardiovascular, pulmonary, endocrine, musculoskeletal,  gastrointestinal, neurological and psychiatric system(s) were reviewed and pertinent findings if present are noted.  Genitourinary: urinary frequency, feelings of urinary urgency, nocturia, difficulty starting the urinary stream, weak urinary stream, urinary stream starts and stops, incomplete emptying of bladder and erectile dysfunction.  Cardiovascular: leg swelling (ankle).    Vitals Vital Signs [Data Includes: Last 1 Day]  Recorded: 02Sep2015 11:57AM  Height: 5 ft 10.5 in Weight: 196 lb  BMI Calculated: 27.73 BSA Calculated: 2.08 Blood Pressure: 128 / 79 Temperature: 98.4 F Heart Rate: 65  Physical Exam Constitutional: Well nourished and well developed . No acute distress.  ENT:. The ears and nose are normal in appearance.  Neck: The appearance of the neck is normal and no neck mass is present.  Pulmonary: No respiratory distress and normal respiratory rhythm and effort.  Cardiovascular: Heart rate and rhythm are normal . No peripheral edema.  Abdomen: upper midline incision site(s) well healed. The abdomen is soft and nontender. No masses are palpated. No CVA tenderness. No hernias are palpable. No hepatosplenomegaly noted.  Rectal: Rectal exam demonstrates normal sphincter tone, no tenderness and no masses. Estimated prostate size is 3+. The prostate has no nodularity and is not tender. The left seminal vesicle is nonpalpable. The right seminal vesicle is nonpalpable. The perineum is normal on inspection.  Genitourinary: Examination of the penis demonstrates no discharge, no masses, no lesions and a normal meatus. The scrotum is without lesions. The right epididymis is palpably normal and non-tender. The left epididymis is palpably normal and non-tender. The right testis is non-tender and without masses. The left testis is atrophic, but non-tender and without masses.  Lymphatics: The posterior cervical, supraclavicular, femoral and  inguinal nodes are not enlarged or tender.  Skin:  Normal skin turgor, no visible rash and no visible skin lesions.  Neuro/Psych:. Mood and affect are appropriate. Normal sensation of the perineum/perianal region (S3,4,5).    Results/Data Urine [Data Includes: Last 1 Day]   [id number]  COLOR YELLOW   APPEARANCE CLEAR   SPECIFIC GRAVITY 1.015   pH 6.5   GLUCOSE NEG mg/dL  BILIRUBIN NEG   KETONE NEG mg/dL  BLOOD NEG   PROTEIN NEG mg/dL  UROBILINOGEN 0.2 mg/dL  NITRITE NEG   LEUKOCYTE ESTERASE NEG    Old records or history reviewed: I have reviewed records and labs from Dr. Alyson Ingles.  The following images/tracing/specimen were independently visualized:  His recent CT was reviewed. He has a large prostate with an intravesical middle lobe. He has a small renal stone in both kidneys.  The following clinical lab reports were reviewed:  UA reviewed today.    Assessment Assessed  1. Family history of prostate cancer (V16.42) : Grandparent 2. 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 Elevated prostate specific antigen (PSA) (790.93) 4. Nephrolithiasis (592.0)  He has an elevated PSA with a large benign prostate with an intravesical middle lobe with moderate to severe LUT's.   He has asymptomatic bilateral renal stones. without obstruction.   Plan Benign prostatic hyperplasia with urinary obstruction  1. Complex Uroflowmetry; Status:Hold For - Appointment,Date of Service; Requested  [id number];  2. IPSS Questionnarie; Status:Hold For - Appointment,Date of Service; Requested  QPY:[id number];  3. PVR U/S; Status:Hold For - Appointment,Date of Service; Requested for:02Sep2015;  Elevated prostate specific antigen (PSA)  4. PROSTATE BIOPSY ULTRASOUND; Status:Hold For - Appointment,Date of Service;  Requested [id number];  5. PSA REFLEX TO FREE; Status:Hold For - Specimen/Data Collection,Appointment;  Requested SNK:[id number];  6. Follow-up Office  Follow-up  Status: Hold For - Appointment,Date of Service   Requested  for: 02Sep2015 Health Maintenance  7. UA With REFLEX; [Do Not Release]; Status:Resulted - Requires Verification;   Done:  [id number] 12:26PM  I am going to repeat a PSA today and if it is still elevated I will have him return for a prostate [place] and biopsy. The risks of bleeding, ifection and voiding difficulty were reviewed and Levaquin was sent.  He will do a flowrate, PVR and IPSS on return as well and may need cystoscopy depending on the biopsy results.   Discussion/Summary CC: Dr. Trilby Drummer.    He has not tolerated Rapaflo well and has obstruction on cystoscopy.  His prostate biopsy just showed HGPIN.   He has elected to have a TURP.

## 2014-08-15 ENCOUNTER — Ambulatory Visit (HOSPITAL_COMMUNITY): Payer: Medicare Other | Admitting: Anesthesiology

## 2014-08-15 ENCOUNTER — Encounter (HOSPITAL_COMMUNITY): Payer: Self-pay | Admitting: *Deleted

## 2014-08-15 ENCOUNTER — Observation Stay (HOSPITAL_COMMUNITY)
Admission: RE | Admit: 2014-08-15 | Discharge: 2014-08-16 | Disposition: A | Discharge status: Home / Self Care | Payer: Medicare Other | Source: Ambulatory Visit | Attending: Urology | Admitting: Urology

## 2014-08-15 ENCOUNTER — Encounter (HOSPITAL_COMMUNITY)
Admission: RE | Disposition: A | Discharge status: Home / Self Care | Payer: Self-pay | Source: Ambulatory Visit | Attending: Urology

## 2014-08-15 DIAGNOSIS — K449 Diaphragmatic hernia without obstruction or gangrene: Secondary | ICD-10-CM | POA: Diagnosis not present

## 2014-08-15 DIAGNOSIS — K219 Gastro-esophageal reflux disease without esophagitis: Secondary | ICD-10-CM | POA: Diagnosis not present

## 2014-08-15 DIAGNOSIS — N400 Enlarged prostate without lower urinary tract symptoms: Secondary | ICD-10-CM | POA: Diagnosis present

## 2014-08-15 DIAGNOSIS — R3914 Feeling of incomplete bladder emptying: Secondary | ICD-10-CM | POA: Insufficient documentation

## 2014-08-15 DIAGNOSIS — R3912 Poor urinary stream: Secondary | ICD-10-CM | POA: Diagnosis not present

## 2014-08-15 DIAGNOSIS — Z8042 Family history of malignant neoplasm of prostate: Secondary | ICD-10-CM | POA: Insufficient documentation

## 2014-08-15 DIAGNOSIS — R351 Nocturia: Secondary | ICD-10-CM | POA: Insufficient documentation

## 2014-08-15 DIAGNOSIS — R3911 Hesitancy of micturition: Secondary | ICD-10-CM | POA: Diagnosis not present

## 2014-08-15 DIAGNOSIS — N138 Other obstructive and reflux uropathy: Secondary | ICD-10-CM | POA: Insufficient documentation

## 2014-08-15 DIAGNOSIS — N401 Enlarged prostate with lower urinary tract symptoms: Secondary | ICD-10-CM | POA: Diagnosis not present

## 2014-08-15 DIAGNOSIS — N200 Calculus of kidney: Secondary | ICD-10-CM | POA: Diagnosis not present

## 2014-08-15 DIAGNOSIS — R350 Frequency of micturition: Secondary | ICD-10-CM | POA: Diagnosis not present

## 2014-08-15 DIAGNOSIS — Z808 Family history of malignant neoplasm of other organs or systems: Secondary | ICD-10-CM | POA: Insufficient documentation

## 2014-08-15 HISTORY — PX: TRANSURETHRAL RESECTION OF PROSTATE: SHX73

## 2014-08-15 SURGERY — TRANSURETHRAL RESECTION OF THE PROSTATE WITH GYRUS INSTRUMENTS
Anesthesia: General

## 2014-08-15 MED ORDER — ONDANSETRON HCL 4 MG/2ML IJ SOLN: 4.0000 mg | INTRAMUSCULAR | Status: DC | PRN

## 2014-08-15 MED ORDER — DIPHENHYDRAMINE HCL 50 MG/ML IJ SOLN: 12.5000 mg | Freq: Four times a day (QID) | INTRAMUSCULAR | Status: DC | PRN

## 2014-08-15 MED ORDER — ONDANSETRON HCL 4 MG/2ML IJ SOLN
INTRAMUSCULAR | Status: AC
  Filled 2014-08-15: qty 2

## 2014-08-15 MED ORDER — LIDOCAINE HCL (CARDIAC) 20 MG/ML IV SOLN
INTRAVENOUS | Status: AC
  Filled 2014-08-15: qty 5

## 2014-08-15 MED ORDER — ZOLPIDEM TARTRATE 5 MG PO TABS: 5.0000 mg | ORAL_TABLET | Freq: Every evening | ORAL | Status: DC | PRN

## 2014-08-15 MED ORDER — LIDOCAINE HCL (CARDIAC) 20 MG/ML IV SOLN
INTRAVENOUS | Status: DC | PRN
  Administered 2014-08-15: 50 mg via INTRAVENOUS

## 2014-08-15 MED ORDER — METOCLOPRAMIDE HCL 5 MG/ML IJ SOLN
INTRAMUSCULAR | Status: DC | PRN
  Administered 2014-08-15: 5 mg via INTRAVENOUS

## 2014-08-15 MED ORDER — FENTANYL CITRATE 0.05 MG/ML IJ SOLN: 25.0000 ug | INTRAMUSCULAR | Status: DC | PRN

## 2014-08-15 MED ORDER — FENTANYL CITRATE 0.05 MG/ML IJ SOLN
INTRAMUSCULAR | Status: AC
  Filled 2014-08-15: qty 5

## 2014-08-15 MED ORDER — KCL IN DEXTROSE-NACL 20-5-0.45 MEQ/L-%-% IV SOLN
INTRAVENOUS | Status: DC
  Administered 2014-08-15: 16:00:00 via INTRAVENOUS
  Filled 2014-08-15 (×3): qty 1000

## 2014-08-15 MED ORDER — HYOSCYAMINE SULFATE 0.125 MG SL SUBL
0.1250 mg | SUBLINGUAL_TABLET | SUBLINGUAL | Status: DC | PRN
  Filled 2014-08-15: qty 1

## 2014-08-15 MED ORDER — DEXAMETHASONE SODIUM PHOSPHATE 10 MG/ML IJ SOLN
INTRAMUSCULAR | Status: DC | PRN
  Administered 2014-08-15: 10 mg via INTRAVENOUS

## 2014-08-15 MED ORDER — BISACODYL 10 MG RE SUPP: 10.0000 mg | Freq: Every day | RECTAL | Status: DC | PRN

## 2014-08-15 MED ORDER — SODIUM CHLORIDE 0.9 % IR SOLN
3000.0000 mL | Status: DC
  Administered 2014-08-15: 3000 mL

## 2014-08-15 MED ORDER — BELLADONNA ALKALOIDS-OPIUM 16.2-60 MG RE SUPP
RECTAL | Status: DC | PRN
  Administered 2014-08-15: 1 via RECTAL

## 2014-08-15 MED ORDER — SODIUM CHLORIDE 0.9 % IR SOLN
Status: DC | PRN
  Administered 2014-08-15: 21000 mL via INTRAVESICAL

## 2014-08-15 MED ORDER — PROPOFOL 10 MG/ML IV BOLUS
INTRAVENOUS | Status: DC | PRN
  Administered 2014-08-15: 100 mg via INTRAVENOUS

## 2014-08-15 MED ORDER — POLYVINYL ALCOHOL 1.4 % OP SOLN
2.0000 [drp] | OPHTHALMIC | Status: DC | PRN
  Filled 2014-08-15: qty 15

## 2014-08-15 MED ORDER — HYDROCODONE-ACETAMINOPHEN 5-325 MG PO TABS: 1.0000 | ORAL_TABLET | ORAL | Status: DC | PRN

## 2014-08-15 MED ORDER — ONDANSETRON HCL 4 MG/2ML IJ SOLN
INTRAMUSCULAR | Status: DC | PRN
  Administered 2014-08-15 (×2): 2 mg via INTRAVENOUS

## 2014-08-15 MED ORDER — HYDROMORPHONE HCL 1 MG/ML IJ SOLN: 0.5000 mg | INTRAMUSCULAR | Status: DC | PRN

## 2014-08-15 MED ORDER — LACTATED RINGERS IV SOLN
INTRAVENOUS | Status: DC
Start: 2014-08-15 — End: 2014-08-15
  Administered 2014-08-15: 11:00:00 via INTRAVENOUS
  Administered 2014-08-15: 1000 mL via INTRAVENOUS

## 2014-08-15 MED ORDER — EPHEDRINE SULFATE 50 MG/ML IJ SOLN
INTRAMUSCULAR | Status: DC | PRN
  Administered 2014-08-15 (×2): 5 mg via INTRAVENOUS

## 2014-08-15 MED ORDER — ACETAMINOPHEN 325 MG PO TABS: 650.0000 mg | ORAL_TABLET | ORAL | Status: DC | PRN

## 2014-08-15 MED ORDER — FENTANYL CITRATE 0.05 MG/ML IJ SOLN
INTRAMUSCULAR | Status: DC | PRN
  Administered 2014-08-15 (×4): 25 ug via INTRAVENOUS

## 2014-08-15 MED ORDER — MIDAZOLAM HCL 5 MG/5ML IJ SOLN
INTRAMUSCULAR | Status: DC | PRN
Start: 2014-08-15 — End: 2014-08-15
  Administered 2014-08-15: .25 mg via INTRAVENOUS

## 2014-08-15 MED ORDER — CIPROFLOXACIN IN D5W 400 MG/200ML IV SOLN
400.0000 mg | INTRAVENOUS | Status: AC
  Administered 2014-08-15: 400 mg via INTRAVENOUS

## 2014-08-15 MED ORDER — CIPROFLOXACIN HCL 500 MG PO TABS
500.0000 mg | ORAL_TABLET | Freq: Two times a day (BID) | ORAL | Status: DC
  Administered 2014-08-15 – 2014-08-16 (×2): 500 mg via ORAL
  Filled 2014-08-15 (×2): qty 1

## 2014-08-15 MED ORDER — BELLADONNA ALKALOIDS-OPIUM 16.2-60 MG RE SUPP
RECTAL | Status: AC
  Filled 2014-08-15: qty 1

## 2014-08-15 MED ORDER — ONDANSETRON HCL 4 MG/2ML IJ SOLN: 4.0000 mg | Freq: Once | INTRAMUSCULAR | Status: DC | PRN

## 2014-08-15 MED ORDER — DOCUSATE SODIUM 100 MG PO CAPS
100.0000 mg | ORAL_CAPSULE | Freq: Two times a day (BID) | ORAL | Status: DC
  Administered 2014-08-15 – 2014-08-16 (×3): 100 mg via ORAL
  Filled 2014-08-15 (×3): qty 1

## 2014-08-15 MED ORDER — MIDAZOLAM HCL 2 MG/2ML IJ SOLN
INTRAMUSCULAR | Status: AC
  Filled 2014-08-15: qty 2

## 2014-08-15 MED ORDER — DIPHENHYDRAMINE HCL 12.5 MG/5ML PO ELIX: 12.5000 mg | ORAL_SOLUTION | Freq: Four times a day (QID) | ORAL | Status: DC | PRN

## 2014-08-15 MED ORDER — DEXAMETHASONE SODIUM PHOSPHATE 10 MG/ML IJ SOLN
INTRAMUSCULAR | Status: AC
  Filled 2014-08-15: qty 1

## 2014-08-15 MED ORDER — CIPROFLOXACIN IN D5W 400 MG/200ML IV SOLN
INTRAVENOUS | Status: AC
  Filled 2014-08-15: qty 200

## 2014-08-15 MED ORDER — PROPOFOL 10 MG/ML IV BOLUS
INTRAVENOUS | Status: AC
  Filled 2014-08-15: qty 20

## 2014-08-15 SURGICAL SUPPLY — 24 items
BAG URINE DRAINAGE (UROLOGICAL SUPPLIES) ×2 IMPLANT
BAG URO CATCHER STRL LF (DRAPE) ×2 IMPLANT
BLADE SURG 15 STRL LF DISP TIS (BLADE) IMPLANT
BLADE SURG 15 STRL SS (BLADE)
CATH FOLEY 3WAY 30CC 22FR (CATHETERS) ×2 IMPLANT
CATH URET 5FR 28IN OPEN ENDED (CATHETERS) IMPLANT
DRAPE CAMERA CLOSED 9X96 (DRAPES) ×2 IMPLANT
ELECT BUTTON HF 24-28F 2 30DE (ELECTRODE) IMPLANT
ELECT HF RESECT BIPO 24F 45 ND (CUTTING LOOP) IMPLANT
ELECT LOOP MED HF 24F 12D (CUTTING LOOP) IMPLANT
ELECT LOOP MED HF 24F 12D CBL (CLIP) ×2 IMPLANT
ELECT REM PT RETURN 9FT ADLT (ELECTROSURGICAL)
ELECTRODE REM PT RTRN 9FT ADLT (ELECTROSURGICAL) IMPLANT
GLOVE SURG SS PI 8.0 STRL IVOR (GLOVE) ×6 IMPLANT
GOWN STRL REUS W/TWL XL LVL3 (GOWN DISPOSABLE) ×4 IMPLANT
HOLDER FOLEY CATH W/STRAP (MISCELLANEOUS) ×2 IMPLANT
IV NS IRRIG 3000ML ARTHROMATIC (IV SOLUTION) IMPLANT
KIT ASPIRATION TUBING (SET/KITS/TRAYS/PACK) IMPLANT
MANIFOLD NEPTUNE II (INSTRUMENTS) ×2 IMPLANT
PACK CYSTO (CUSTOM PROCEDURE TRAY) ×2 IMPLANT
SUT ETHILON 3 0 PS 1 (SUTURE) IMPLANT
SYR 30ML LL (SYRINGE) ×2 IMPLANT
SYRINGE IRR TOOMEY STRL 70CC (SYRINGE) ×2 IMPLANT
TUBING CONNECTING 10 (TUBING) ×6 IMPLANT

## 2014-08-15 NOTE — Anesthesia Preprocedure Evaluation (Addendum)
Anesthesia Evaluation  Patient identified by MRN, date of birth, ID band Patient awake    Reviewed: Allergy & Precautions, H&P , NPO status , Patient's Chart, lab work & pertinent test results  History of Anesthesia Complications Negative for: history of anesthetic complications  Airway Mallampati: I  TM Distance: >3 FB Neck ROM: Full    Dental no notable dental hx. (+) Edentulous Upper, Edentulous Lower   Pulmonary neg pulmonary ROS,  breath sounds clear to auscultation  Pulmonary exam normal       Cardiovascular Exercise Tolerance: Good negative cardio ROS  Rhythm:Regular Rate:Normal     Neuro/Psych negative neurological ROS  negative psych ROS   GI/Hepatic Neg liver ROS, hiatal hernia, GERD-  Controlled,  Endo/Other  negative endocrine ROS  Renal/GU negative Renal ROS  negative genitourinary   Musculoskeletal negative musculoskeletal ROS (+)   Abdominal   Peds negative pediatric ROS (+)  Hematology negative hematology ROS (+)   Anesthesia Other Findings   Reproductive/Obstetrics negative OB ROS                            Anesthesia Physical Anesthesia Plan  ASA: II  Anesthesia Plan: General   Post-op Pain Management:    Induction: Intravenous  Airway Management Planned: LMA  Additional Equipment:   Intra-op Plan:   Post-operative Plan: Extubation in OR  Informed Consent: I have reviewed the patients History and Physical, chart, labs and discussed the procedure including the risks, benefits and alternatives for the proposed anesthesia with the patient or authorized representative who has indicated his/her understanding and acceptance.   Dental advisory given  Plan Discussed with: CRNA  Anesthesia Plan Comments:         Anesthesia Quick Evaluation

## 2014-08-15 NOTE — Transfer of Care (Signed)
Immediate Anesthesia Transfer of Care Note  Patient: Troy Sanchez  Procedure(s) Performed: Procedure(s): TRANSURETHRAL RESECTION OF THE PROSTATE WITH GYRUS INSTRUMENTS (N/A)  Patient Location: PACU  Anesthesia Type:General  Level of Consciousness: awake, alert , oriented and patient cooperative  Airway & Oxygen Therapy: Patient Spontanous Breathing and Patient connected to face mask oxygen  Post-op Assessment: Report given to PACU RN, Post -op Vital signs reviewed and stable and Patient moving all extremities  Post vital signs: Reviewed and stable  Complications: No apparent anesthesia complications

## 2014-08-15 NOTE — Plan of Care (Signed)
Problem: Phase I Progression Outcomes Goal: Tubes/drains patent Outcome: Completed/Met Date Met:  08/15/14     

## 2014-08-15 NOTE — Progress Notes (Signed)
Patient ID: Troy Sanchez, male   DOB: 12-13-1940, 73 y.o.   MRN: [id number] He is doing well post TURP and the urine is clearing.  He has no complaints.   I will have the foley removed in the morning.

## 2014-08-15 NOTE — Plan of Care (Signed)
Problem: Phase I Progression Outcomes Goal: Pain controlled with appropriate interventions Outcome: Completed/Met Date Met:  08/15/14 Goal: No urinary obstruction (foley to traction) Outcome: Completed/Met Date Met:  08/15/14 Goal: Initial discharge plan identified Outcome: Completed/Met Date Met:  08/15/14 Goal: Continue bladder and/or hand irrigation Outcome: Completed/Met Date Met:  08/15/14 Goal: Hemodynamically stable Outcome: Completed/Met Date Met:  08/15/14 Goal: Tolerating diet Outcome: Completed/Met Date Met:  08/15/14 Goal: No symptoms of Post-TURP Syndrome Outcome: Completed/Met Date Met:  08/15/14 Goal: Other Phase I Outcomes/Goals Outcome: Progressing  Problem: Phase II Progression Outcomes Goal: Pain controlled Outcome: Completed/Met Date Met:  08/15/14     

## 2014-08-15 NOTE — Anesthesia Postprocedure Evaluation (Signed)
  Anesthesia Post-op Note  Patient: Troy Sanchez  Procedure(s) Performed: Procedure(s) (LRB): TRANSURETHRAL RESECTION OF THE PROSTATE WITH GYRUS INSTRUMENTS (N/A)  Patient Location: PACU  Anesthesia Type: General  Level of Consciousness: awake and alert   Airway and Oxygen Therapy: Patient Spontanous Breathing  Post-op Pain: mild  Post-op Assessment: Post-op Vital signs reviewed, Patient's Cardiovascular Status Stable, Respiratory Function Stable, Patent Airway and No signs of Nausea or vomiting  Last Vitals:  Filed Vitals:   08/15/14 1324  BP: 116/72  Pulse: 58  Temp: 36.6 C  Resp: 16    Post-op Vital Signs: stable   Complications: No apparent anesthesia complications

## 2014-08-15 NOTE — Discharge Instructions (Signed)
Transurethral Resection of the Prostate °Care After °Refer to this sheet in the next few weeks. These instructions provide you with information on caring for yourself after your procedure. Your caregiver also may give you specific instructions. Your treatment has been planned according to current medical practices, but complications sometimes occur. Call your caregiver if you have any problems or questions after your procedure. °HOME CARE INSTRUCTIONS  °Recovery can take 4-6 weeks. Avoid alcohol, caffeinated drinks, and spicy foods for 2 weeks after your procedure. Drink enough fluids to keep your urine clear or pale yellow. Urinate as soon as you feel the urge to do so. Do not try to hold your urine for long periods of time. °During recovery you may experience pain caused by bladder spasms, which result in a very intense urge to urinate. Take all medicines as directed by your caregiver, including medicines for pain. Try to limit the amount of pain medicines you take because it can cause constipation. If you do become constipated, do not strain to move your bowels. Straining can increase bleeding. Constipation can be minimized by increasing the amount fluids and fiber in your diet. Your caregiver also may prescribe a stool softener. °Do not lift heavy objects (more than 5 lb [2.25 kg]) or perform exercises that cause you to strain for at least 1 month after your procedure. When sitting, you may want to sit in a soft chair or use a cushion. For the first 10 days after your procedure, avoid the following activities: °· Running. °· Strenuous work. °· Long walks. °· Riding in a car for extended periods. °· Sex. °SEEK MEDICAL CARE IF: °· You have difficulty urinating. °· You have blood in your urine that does not go away after you rest or increase your fluid intake. °· You have swelling in your penis or scrotum. °SEEK IMMEDIATE MEDICAL CARE IF:  °· You are suddenly unable to urinate. °· You notice blood clots in your  urine. °· You have chills. °· You have a fever. °· You have pain in your back or lower abdomen. °· You have pain or swelling in your legs. °MAKE SURE YOU:  °· Understand these instructions. °· Will watch your condition. °· Will get help right away if you are not doing well or get worse. °Document Released: 09/27/2005 Document Revised: 06/21/2012 Document Reviewed: 11/05/2011 °Exit[employer]® Patient Information ©2015 [employer]. This information is not intended to replace advice given to you by your health care provider. Make sure you discuss any questions you have with your health care provider. ° °

## 2014-08-15 NOTE — Op Note (Signed)
NAMESHERIF, Troy Sanchez NO.:  [id number]  MEDICAL RECORD NO.:  [id number]  LOCATION:  [facility]                         FACILITY:  [facility]  PHYSICIAN:  Marshall Cork. Jeffie Pollock, M.D.    DATE OF BIRTH:  01/13/1941  DATE OF PROCEDURE:  08/15/2014 DATE OF DISCHARGE:                              OPERATIVE REPORT   PROCEDURE:  Transurethral resection of the prostate.  PREOPERATIVE DIAGNOSIS:  Benign prostatic hypertrophy, bladder outlet obstruction.  POSTOPERATIVE DIAGNOSIS:  Benign prostatic hypertrophy, bladder outlet obstruction.  SURGEON:  Marshall Cork. Jeffie Pollock, MD  ANESTHESIA:  General.  SPECIMEN:  Prostate chips.  DRAINS:  A 22-French 3-way Foley catheter.  BLOOD LOSS:  Approximately 500 mL.  COMPLICATIONS:  None.  INDICATIONS:  Mr. 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  He has elected TURP.  FINDINGS AND PROCEDURE:  He was taken to the operating room where general anesthetic was induced.  He was given Cipro.  He was placed in lithotomy position and fitted with PAS hose.  His perineum and genitalia were prepped with Betadine solution, was draped in usual sterile fashion.  The 28-French resectoscope sheath was placed with the aid of a visual obturator with a 30-degree lens.  Examination revealed a normal urethra. The external sphincter was intact.  The prostatic urethra was approximately 4-5 cm in length with trilobar hyperplasia with a large middle lobe.  The ureteral orifices were unremarkable away from the bladder neck.  He had moderate-to-severe trabeculation with cellules, but no tumor stones or inflammation were noted.  Once this endoscopy had been performed, the visual obturator was replaced with Beatrix Fetters handle with a 30-degree lens and bipolar loop. Saline was used for the irrigant.  A prostate resection was then initiated at the middle lobe, which was resected down to the bladder neck fibers.  I  then resected the right prostatic lobe from bladder neck to apex out to capsular fibers.  This was followed by the left lobe, additional resection from the floor of the prostate out to alongside of the verumontanum was performed.  The bladder was evacuated free of chips.  Initial hemostasis was achieved. There was some residual apical and anterior tissue that was then resected.  Once this tissue had been resected, the chips were evacuated and final hemostasis was achieved.  Inspection revealed an excellent channel.  No active bleeding.  Intact ureteral orifices.  No retained chips or clots and intact external sphincter.  The bladder was left full.  When the scope was removed, pressure on the bladder produced good stream.  A 22-French 3-way Foley catheter was placed with the aid of a catheter guide.  The balloon was filled with 30 mL of sterile fluid. The catheter was hand irrigated with clear return and placed to straight drainage and continuous irrigation.  He was taken down from lithotomy position.  His anesthetic was reversed.  He was moved to recovery room in stable condition.  There were no complications.     Marshall Cork. Jeffie Pollock, M.D.     JJW/MEDQ  D:  08/15/2014  T:  08/15/2014  Job:  [id number]

## 2014-08-15 NOTE — Interval H&P Note (Signed)
History and Physical Interval Note:  08/15/2014 10:19 AM  Troy Sanchez  has presented today for surgery, with the diagnosis of BPH with Bladder Outlet Obstruction  The various methods of treatment have been discussed with the patient and family. After consideration of risks, benefits and other options for treatment, the patient has consented to  Procedure(s): TRANSURETHRAL RESECTION OF THE PROSTATE WITH GYRUS INSTRUMENTS (N/A) as a surgical intervention .  The patient's history has been reviewed, patient examined, no change in status, stable for surgery.  I have reviewed the patient's chart and labs.  Questions were answered to the patient's satisfaction.     Rickelle Sylvestre J

## 2014-08-15 NOTE — Brief Op Note (Signed)
08/15/2014  11:59 AM  PATIENT:  Troy Sanchez  73 y.o. male  PRE-OPERATIVE DIAGNOSIS:  BPH with Bladder Outlet Obstruction  POST-OPERATIVE DIAGNOSIS:  BPH with bladder outlet obstruction  PROCEDURE:  Procedure(s): TRANSURETHRAL RESECTION OF THE PROSTATE WITH GYRUS INSTRUMENTS (N/A)  SURGEON:  Surgeon(s) and Role:    * Malka So, MD - Primary  PHYSICIAN ASSISTANT:   ASSISTANTS: none   ANESTHESIA:   general  EBL:  Total I/O In: 1000 [I.V.:1000] Out: -   BLOOD ADMINISTERED:none  DRAINS: Urinary Catheter (Foley)   LOCAL MEDICATIONS USED:  NONE  SPECIMEN:  Source of Specimen:  prostate chips  DISPOSITION OF SPECIMEN:  PATHOLOGY  COUNTS:  YES  TOURNIQUET:  * No tourniquets in log *  DICTATION: .Other Dictation: Dictation Number [id number]  PLAN OF CARE: Admit for overnight observation  PATIENT DISPOSITION:  PACU - hemodynamically stable.   Delay start of Pharmacological VTE agent (>24hrs) due to surgical blood loss or risk of bleeding: yes

## 2014-08-16 ENCOUNTER — Encounter (HOSPITAL_COMMUNITY): Payer: Self-pay | Admitting: Urology

## 2014-08-16 DIAGNOSIS — N401 Enlarged prostate with lower urinary tract symptoms: Secondary | ICD-10-CM | POA: Diagnosis not present

## 2014-08-16 MED ORDER — DOCUSATE SODIUM 100 MG PO CAPS: 100.0000 mg | ORAL_CAPSULE | Freq: Two times a day (BID) | ORAL | Status: DC

## 2014-08-16 NOTE — Plan of Care (Signed)
Problem: Consults Goal: TURP/TURBT Patient Education (See Patient Education module for education specifics.)  Outcome: Completed/Met Date Met:  08/16/14 Goal: Skin Care Protocol Initiated - if Braden Score 18 or less If consults are not indicated, leave blank or document N/A  Outcome: Not Applicable Date Met:  73/42/87 Goal: Nutrition Consult-if indicated Outcome: Not Applicable Date Met:  68/11/57 Goal: Diabetes Guidelines if Diabetic/Glucose > 140 If diabetic or lab glucose is > 140 mg/dl - Initiate Diabetes/Hyperglycemia Guidelines & Document Interventions  Outcome: Not Applicable Date Met:  26/20/35

## 2014-08-16 NOTE — Care Management Note (Signed)
    Page 1 of 1   08/16/2014     4:02:50 PM CARE MANAGEMENT NOTE 08/16/2014  Patient:  Troy Sanchez, Troy Sanchez   Account Number:  [id number]  Date Initiated:  08/16/2014  Documentation initiated by:  Dessa Phi  Subjective/Objective Assessment:   73 Y/O M ADMITTED W/BPH.     Action/Plan:   FROM HOME.   Anticipated DC Date:  08/16/2014   Anticipated DC Plan:  [place]  CM consult      Choice offered to / List presented to:             Status of service:  Completed, signed off Medicare Important Message given?   (If response is "NO", the following Medicare IM given date fields will be blank) Date Medicare IM given:   Medicare IM given by:   Date Additional Medicare IM given:   Additional Medicare IM given by:    Discharge Disposition:  HOME/SELF CARE  Per UR Regulation:  Reviewed for med. necessity/level of care/duration of stay  If discussed at [place] of Stay Meetings, dates discussed:    Comments:  08/16/14 Toiya Morrish RN,BNS NCM [phone] D/C HOME NO [place].

## 2014-08-16 NOTE — Plan of Care (Signed)
Problem: Phase I Progression Outcomes Goal: Bedrest x 6 hrs for spinal anesthesia Outcome: Not Applicable Date Met:  12/75/17 Goal: Other Phase I Outcomes/Goals Outcome: Completed/Met Date Met:  08/16/14  Problem: Phase II Progression Outcomes Goal: OOB/Ambulate TID Outcome: Completed/Met Date Met:  08/16/14 Goal: Discharge plan established Outcome: Completed/Met Date Met:  08/16/14 Goal: PO fluids pushed Outcome: Completed/Met Date Met:  08/16/14 Goal: Urinary output increased Outcome: Completed/Met Date Met:  08/16/14 Goal: Tolerating diet Outcome: Completed/Met Date Met:  08/16/14 Goal: Hemodynamically stable Outcome: Completed/Met Date Met:  08/16/14 Goal: NSL IV if tolerating PO fluids Outcome: Completed/Met Date Met:  08/16/14 Goal: Foley discontinued if ordered Outcome: Completed/Met Date Met:  08/16/14 Goal: Other Phase II Outcomes/Goals Outcome: Completed/Met Date Met:  08/16/14  Problem: Discharge Progression Outcomes Goal: Barriers To Progression Addressed/Resolved Outcome: Completed/Met Date Met:  08/16/14 Goal: Discharge plan in place and appropriate Outcome: Completed/Met Date Met:  08/16/14 Goal: Pain controlled with appropriate interventions Outcome: Completed/Met Date Met:  08/16/14 Goal: Hemodynamically stable Outcome: Completed/Met Date Met:  00/17/49 Goal: Complications resolved/controlled Outcome: Completed/Met Date Met:  08/16/14 Goal: Tolerating diet Outcome: Completed/Met Date Met:  08/16/14 Goal: Activity without assist or at baseline Outcome: Completed/Met Date Met:  08/16/14 Goal: Other Discharge Outcomes/Goals Outcome: Completed/Met Date Met:  08/16/14

## 2014-08-16 NOTE — Discharge Summary (Signed)
Physician Discharge Summary  Patient ID: Troy Sanchez MRN: [id number] DOB/AGE: 1941-01-09 73 y.o.  Admit date: 08/15/2014 Discharge date: 08/16/2014  Admission Diagnoses:  BPH with urinary obstruction  Discharge Diagnoses:  Principal Problem:   BPH with urinary obstruction   History reviewed. No pertinent past medical history.  Surgeries: Procedure(s): TRANSURETHRAL RESECTION OF THE PROSTATE WITH GYRUS INSTRUMENTS on 08/15/2014   Consultants (if any):    Discharged Condition: Improved  Hospital Course: [name] is an 73 y.o. male who was admitted 08/15/2014 with a diagnosis of BPH with urinary obstruction and went to the operating room on 08/15/2014 and underwent the above named procedures.  His urine remained clear and the foley was removed at 610am.  He has not voided yet but has no urge.    He was given perioperative antibiotics:      Anti-infectives    Start     Dose/Rate Route Frequency Ordered Stop   08/15/14 2000  ciprofloxacin (CIPRO) tablet 500 mg     500 mg Oral 2 times daily 08/15/14 1337     08/15/14 0754  ciprofloxacin (CIPRO) IVPB 400 mg     400 mg200 mL/hr over 60 Minutes Intravenous 60 min pre-op 08/15/14 0754 08/15/14 1030    .  He was given sequential compression devices for DVT prophylaxis.  He benefited maximally from the hospital stay and there were no complications.    Recent vital signs:  Filed Vitals:   08/16/14 0549  BP: 100/50  Pulse:   Temp:   Resp:     Recent laboratory studies:  Lab Results  Component Value Date   HGB 15.1 08/06/2014   HGB 12.0* 03/14/2014   HGB 13.2 03/08/2014   Lab Results  Component Value Date   WBC 4.9 08/06/2014   PLT 173 08/06/2014   Lab Results  Component Value Date   INR 1.10 03/06/2014   Lab Results  Component Value Date   NA 141 08/06/2014   K 4.7 08/06/2014   CL 101 08/06/2014   CO2 31 08/06/2014   BUN 12 08/06/2014   CREATININE 1.03 08/06/2014   GLUCOSE 89 08/06/2014     Discharge Medications:     Medication List    STOP taking these medications        RAPAFLO 8 MG Caps capsule  Generic drug:  silodosin      TAKE these medications        cholecalciferol 1000 UNITS tablet  Commonly known as:  VITAMIN D  Take 1,000 Units by mouth every morning.     docusate sodium 100 MG capsule  Commonly known as:  COLACE  Take 1 capsule (100 mg total) by mouth 2 (two) times daily.     Fish Oil 1000 MG Caps  Take 1,000 mg by mouth every morning.     GAS RELIEF EXTRA STRENGTH PO  Take 1 tablet by mouth every hour as needed (gas).     multivitamin with minerals tablet  Take 1 tablet by mouth every morning.     REFRESH 1.4-0.6 % ophthalmic solution  Generic drug:  polyvinyl alcohol-povidone  Place 2 drops into both eyes as needed (dry eyes.).        Diagnostic Studies: No results found.  Disposition: 01-Home or Self Care    Follow-up Information    Follow up with [facility], NP On 08/22/2014.   Specialty:  Urology   Why:  [address] information:   [place] [id number]  [phone]        Signed: Malka So 08/16/2014, 7:08 AM

## 2015-01-01 DIAGNOSIS — N401 Enlarged prostate with lower urinary tract symptoms: Secondary | ICD-10-CM | POA: Diagnosis not present

## 2015-01-01 DIAGNOSIS — R972 Elevated prostate specific antigen [PSA]: Secondary | ICD-10-CM | POA: Diagnosis not present

## 2015-01-01 DIAGNOSIS — R339 Retention of urine, unspecified: Secondary | ICD-10-CM | POA: Diagnosis not present

## 2015-01-21 ENCOUNTER — Other Ambulatory Visit: Payer: Self-pay | Admitting: Dermatology

## 2015-01-21 DIAGNOSIS — D239 Other benign neoplasm of skin, unspecified: Secondary | ICD-10-CM | POA: Diagnosis not present

## 2015-01-21 DIAGNOSIS — L570 Actinic keratosis: Secondary | ICD-10-CM | POA: Diagnosis not present

## 2015-01-21 DIAGNOSIS — D485 Neoplasm of uncertain behavior of skin: Secondary | ICD-10-CM | POA: Diagnosis not present

## 2015-01-21 DIAGNOSIS — L814 Other melanin hyperpigmentation: Secondary | ICD-10-CM | POA: Diagnosis not present

## 2015-03-26 DIAGNOSIS — Z0000 Encounter for general adult medical examination without abnormal findings: Secondary | ICD-10-CM | POA: Diagnosis not present

## 2015-03-26 DIAGNOSIS — Z23 Encounter for immunization: Secondary | ICD-10-CM | POA: Diagnosis not present

## 2015-03-26 DIAGNOSIS — Z1389 Encounter for screening for other disorder: Secondary | ICD-10-CM | POA: Diagnosis not present

## 2015-03-26 DIAGNOSIS — E785 Hyperlipidemia, unspecified: Secondary | ICD-10-CM | POA: Diagnosis not present

## 2015-03-26 DIAGNOSIS — E559 Vitamin D deficiency, unspecified: Secondary | ICD-10-CM | POA: Diagnosis not present

## 2015-03-26 DIAGNOSIS — Z125 Encounter for screening for malignant neoplasm of prostate: Secondary | ICD-10-CM | POA: Diagnosis not present

## 2015-04-04 DIAGNOSIS — E785 Hyperlipidemia, unspecified: Secondary | ICD-10-CM | POA: Diagnosis not present

## 2015-04-04 DIAGNOSIS — E559 Vitamin D deficiency, unspecified: Secondary | ICD-10-CM | POA: Diagnosis not present

## 2015-04-04 DIAGNOSIS — N400 Enlarged prostate without lower urinary tract symptoms: Secondary | ICD-10-CM | POA: Diagnosis not present

## 2015-04-04 DIAGNOSIS — R7309 Other abnormal glucose: Secondary | ICD-10-CM | POA: Diagnosis not present

## 2015-06-20 DIAGNOSIS — M10072 Idiopathic gout, left ankle and foot: Secondary | ICD-10-CM | POA: Diagnosis not present

## 2015-07-03 DIAGNOSIS — M109 Gout, unspecified: Secondary | ICD-10-CM | POA: Diagnosis not present

## 2015-07-03 DIAGNOSIS — R609 Edema, unspecified: Secondary | ICD-10-CM | POA: Diagnosis not present

## 2015-07-23 DIAGNOSIS — R972 Elevated prostate specific antigen [PSA]: Secondary | ICD-10-CM | POA: Diagnosis not present

## 2015-07-30 DIAGNOSIS — R972 Elevated prostate specific antigen [PSA]: Secondary | ICD-10-CM | POA: Diagnosis not present

## 2015-07-30 DIAGNOSIS — N400 Enlarged prostate without lower urinary tract symptoms: Secondary | ICD-10-CM | POA: Diagnosis not present

## 2015-07-30 DIAGNOSIS — N200 Calculus of kidney: Secondary | ICD-10-CM | POA: Diagnosis not present

## 2015-09-30 DIAGNOSIS — E785 Hyperlipidemia, unspecified: Secondary | ICD-10-CM | POA: Diagnosis not present

## 2015-09-30 DIAGNOSIS — R7309 Other abnormal glucose: Secondary | ICD-10-CM | POA: Diagnosis not present

## 2015-09-30 DIAGNOSIS — N400 Enlarged prostate without lower urinary tract symptoms: Secondary | ICD-10-CM | POA: Diagnosis not present

## 2015-09-30 DIAGNOSIS — E559 Vitamin D deficiency, unspecified: Secondary | ICD-10-CM | POA: Diagnosis not present

## 2015-09-30 DIAGNOSIS — Z125 Encounter for screening for malignant neoplasm of prostate: Secondary | ICD-10-CM | POA: Diagnosis not present

## 2015-10-07 DIAGNOSIS — R7309 Other abnormal glucose: Secondary | ICD-10-CM | POA: Diagnosis not present

## 2015-10-07 DIAGNOSIS — N400 Enlarged prostate without lower urinary tract symptoms: Secondary | ICD-10-CM | POA: Diagnosis not present

## 2015-10-07 DIAGNOSIS — E559 Vitamin D deficiency, unspecified: Secondary | ICD-10-CM | POA: Diagnosis not present

## 2015-10-07 DIAGNOSIS — E785 Hyperlipidemia, unspecified: Secondary | ICD-10-CM | POA: Diagnosis not present

## 2016-02-14 DIAGNOSIS — H2513 Age-related nuclear cataract, bilateral: Secondary | ICD-10-CM | POA: Diagnosis not present

## 2016-02-14 DIAGNOSIS — H5203 Hypermetropia, bilateral: Secondary | ICD-10-CM | POA: Diagnosis not present

## 2016-02-14 DIAGNOSIS — H04123 Dry eye syndrome of bilateral lacrimal glands: Secondary | ICD-10-CM | POA: Diagnosis not present

## 2016-02-14 DIAGNOSIS — H524 Presbyopia: Secondary | ICD-10-CM | POA: Diagnosis not present

## 2016-04-07 DIAGNOSIS — Z1389 Encounter for screening for other disorder: Secondary | ICD-10-CM | POA: Diagnosis not present

## 2016-04-07 DIAGNOSIS — Z0000 Encounter for general adult medical examination without abnormal findings: Secondary | ICD-10-CM | POA: Diagnosis not present

## 2016-04-07 DIAGNOSIS — Z125 Encounter for screening for malignant neoplasm of prostate: Secondary | ICD-10-CM | POA: Diagnosis not present

## 2016-04-07 DIAGNOSIS — E559 Vitamin D deficiency, unspecified: Secondary | ICD-10-CM | POA: Diagnosis not present

## 2016-04-07 DIAGNOSIS — E785 Hyperlipidemia, unspecified: Secondary | ICD-10-CM | POA: Diagnosis not present

## 2016-04-14 DIAGNOSIS — N182 Chronic kidney disease, stage 2 (mild): Secondary | ICD-10-CM | POA: Diagnosis not present

## 2016-04-14 DIAGNOSIS — E559 Vitamin D deficiency, unspecified: Secondary | ICD-10-CM | POA: Diagnosis not present

## 2016-04-14 DIAGNOSIS — N400 Enlarged prostate without lower urinary tract symptoms: Secondary | ICD-10-CM | POA: Diagnosis not present

## 2016-04-14 DIAGNOSIS — R0600 Dyspnea, unspecified: Secondary | ICD-10-CM | POA: Diagnosis not present

## 2016-04-14 DIAGNOSIS — E785 Hyperlipidemia, unspecified: Secondary | ICD-10-CM | POA: Diagnosis not present

## 2016-04-15 ENCOUNTER — Other Ambulatory Visit: Payer: Self-pay | Admitting: Internal Medicine

## 2016-04-15 DIAGNOSIS — R591 Generalized enlarged lymph nodes: Secondary | ICD-10-CM

## 2016-04-15 DIAGNOSIS — R0600 Dyspnea, unspecified: Secondary | ICD-10-CM

## 2016-04-16 DIAGNOSIS — E7800 Pure hypercholesterolemia, unspecified: Secondary | ICD-10-CM | POA: Diagnosis not present

## 2016-04-16 DIAGNOSIS — R0609 Other forms of dyspnea: Secondary | ICD-10-CM | POA: Diagnosis not present

## 2016-04-16 DIAGNOSIS — R9431 Abnormal electrocardiogram [ECG] [EKG]: Secondary | ICD-10-CM | POA: Diagnosis not present

## 2016-04-16 DIAGNOSIS — R739 Hyperglycemia, unspecified: Secondary | ICD-10-CM | POA: Diagnosis not present

## 2016-04-21 ENCOUNTER — Ambulatory Visit
Admission: RE | Admit: 2016-04-21 | Discharge: 2016-04-21 | Disposition: A | Discharge status: Home / Self Care | Payer: Medicare Other | Source: Ambulatory Visit | Attending: Internal Medicine | Admitting: Internal Medicine

## 2016-04-21 DIAGNOSIS — R0600 Dyspnea, unspecified: Secondary | ICD-10-CM

## 2016-04-21 DIAGNOSIS — R0602 Shortness of breath: Secondary | ICD-10-CM | POA: Diagnosis not present

## 2016-04-21 DIAGNOSIS — R591 Generalized enlarged lymph nodes: Secondary | ICD-10-CM

## 2016-04-21 MED ORDER — IOPAMIDOL (ISOVUE-300) INJECTION 61%
75.0000 mL | Freq: Once | INTRAVENOUS | Status: AC | PRN
  Administered 2016-04-21: 75 mL via INTRAVENOUS

## 2016-04-23 DIAGNOSIS — R0602 Shortness of breath: Secondary | ICD-10-CM | POA: Diagnosis not present

## 2016-04-23 DIAGNOSIS — R9431 Abnormal electrocardiogram [ECG] [EKG]: Secondary | ICD-10-CM | POA: Diagnosis not present

## 2016-04-28 DIAGNOSIS — R972 Elevated prostate specific antigen [PSA]: Secondary | ICD-10-CM | POA: Diagnosis not present

## 2016-04-28 DIAGNOSIS — N400 Enlarged prostate without lower urinary tract symptoms: Secondary | ICD-10-CM | POA: Diagnosis not present

## 2016-04-28 DIAGNOSIS — R9431 Abnormal electrocardiogram [ECG] [EKG]: Secondary | ICD-10-CM | POA: Diagnosis not present

## 2016-04-28 DIAGNOSIS — I700 Atherosclerosis of aorta: Secondary | ICD-10-CM | POA: Diagnosis not present

## 2016-04-28 DIAGNOSIS — R0602 Shortness of breath: Secondary | ICD-10-CM | POA: Diagnosis not present

## 2016-05-05 DIAGNOSIS — R9431 Abnormal electrocardiogram [ECG] [EKG]: Secondary | ICD-10-CM | POA: Diagnosis not present

## 2016-05-05 DIAGNOSIS — E7800 Pure hypercholesterolemia, unspecified: Secondary | ICD-10-CM | POA: Diagnosis not present

## 2016-05-05 DIAGNOSIS — R0609 Other forms of dyspnea: Secondary | ICD-10-CM | POA: Diagnosis not present

## 2016-05-05 DIAGNOSIS — R739 Hyperglycemia, unspecified: Secondary | ICD-10-CM | POA: Diagnosis not present

## 2016-05-12 DIAGNOSIS — R9439 Abnormal result of other cardiovascular function study: Secondary | ICD-10-CM | POA: Diagnosis not present

## 2016-05-16 DIAGNOSIS — R0600 Dyspnea, unspecified: Secondary | ICD-10-CM | POA: Diagnosis present

## 2016-05-16 DIAGNOSIS — R0609 Other forms of dyspnea: Secondary | ICD-10-CM | POA: Diagnosis present

## 2016-05-16 NOTE — H&P (Signed)
OFFICE VISIT NOTES COPIED TO EPIC FOR DOCUMENTATION  . History of Present Illness Laverda Page MD; 05/18/16 5:54 PM) Patient words: f/u for echo and nuc test results; last office visit 04/16/2016.  The patient is a 75 year old male who presents for a follow-up for Shortness of breath. He is fairly active, wife is accompanying him today. We'll the past 3-4 months he has noticed worsening shortness of breath with activity, it was gradual in onset, states that initially he noticed it heavy exertional activities like mowing the lawn. No history advised that it has become a chore to more the lawn, gets markedly dyspneic even climbing 1 flight of stairs it is very unusual for him. He underwent echocardiogram and stress testing and presents for f/u. No chnage in symptoms, on his last office visit and started him on atorvastatin due to aortic atherosclerosis and is symptoms suggestive of anginal equivalent. He is tolerating this without any complications.  He denies any associated chest discomfort or chest tightness. He states that he has reduced his physical activity due to marked dyspnea. No leg edema or pain. Swelling of the lower extremities. No hemoptysis, dizziness or syncope. Denies symptoms to suggest a TIA or claudication. No history of hypertension, has hyperglycemia and hyperlipidemia by history. No history of prior tobacco use. No recent weight changes.   Problem List/Past Medical (April Garrison; May 18, 2016 10:47 AM) Dyspnea on exertion (R06.09)  Chest x-ray 03/13/2016: Low lung volumes, cardiac silhouette upper limits of normal, area of increased density left lower lobe, small left effusion. Mild interstitial prominent markings. Hyperglycemia (R73.9)  Hypercholesteremia (E78.00)  Labwork  Labs 07/5/017: A1c 5.7%. HB 14.4/HCT 44.0, platelets 180. Serum glucose 103 mg, CMP otherwise normal. BUN 11, serum creatinine 1.15, eGFR 63 mL. Total cholesterol 183, triglycerides 82, HDL  41, LDL 126. TSH normal. Vitamin D 40.2. PSA normal. Abnormal EKG (R94.31)  BPH (benign prostatic hyperplasia) (N40.0)  Vitamin D deficiency (E55.9)  Aortic atherosclerosis (I70.0) 03/05/2014 CT abdomen 2015 Hiatal incarcerated S/P surgical repair  Allergies (April Garrison; 05-18-16 10:47 AM) No Known Drug Allergies 04/16/2016  Family History (April Garrison; 18-May-2016 10:47 AM) Mother  Deceased. at age 64 from natural causes; no heart attacks or strokes, no cardiovascular conditions Father  Deceased. at age 46 from fall complications; no heart attacks or strokes, no cardiovascular conditions Sister 1  In stable health. 3 yrs younger; no cardiovascular conditions Brother 1  In stable health. 6 yrs younger; h/o CABG x4 at age 82, no heart attacks or strokes  Social History (April Garrison; 18-May-2016 10:47 AM) Marital status  Married. Living Situation  Lives with spouse. Number of Children  2. Non Drinker/No Alcohol Use  Current tobacco use  Never smoker.  Past Surgical History (April Garrison; 05-18-16 10:47 AM) Esophageal Stretch 1999 Cholecystectomy 12/2002 Hiatal Hernia Surgery 02/2014 Prostatectomy; Transurethral 07/2014  Medication History (April Garrison; 2016-05-18 10:53 AM) Aspirin Adult Low Strength ('81MG'$  Tablet Chewable, 1 (one) Oral daily, Taken starting 04/16/2016) Active. (18-May-2016; Pt has not started yet) Atorvastatin Calcium ('10MG'$  Tablet, 1 (one) Tablet Oral daily, Taken starting 04/16/2016) Active. Fish Oil (1 Oral daily) Specific dose unknown - Active. Folic Acid (751ZGY Tablet, 1 Oral daily) Active. Vitamin D3 (2000UNIT Tablet, 1 Oral daily) Active. Multivitamin Adults 50+ (1 Oral daily) Active. Medications Reconciled (verbally with pt/ no list or medication present)  Diagnostic Studies History (April Garrison; 18-May-2016 10:49 AM) Echocardiogram 04/28/2016 Left ventricle cavity is normal in size. Mild concentric hypertrophy of the left  ventricle. Normal global  wall motion. Normal diastolic filling pattern. Calculated EF 55%. Right atrial cavity is mildly dilated. Right ventricle cavity is moderately dilated. Mildly reduced right ventricular function. A moderateor band is noted at the RV apex (normal variant). Mild (Grade I) aortic regurgitation. Mild (Grade I) mitral regurgitation. Trace tricuspid regurgitation. Unable to estimate PA pressure due to absence/minimal TR signal. Nuclear stress test 04/23/2016 1. 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 Poor R-wave progression, cannot exclude anterolateral infarct old. Cannot exclude inferior infarct old. Stress EKG was difficult to interpret due to baseline artifact, however patient did develop more widening in QRS complex morphology with exercise which resolved into recovery. There was nonspecific T abnormality at the end of stress test. Patient exercised on Bruce protocol for 5 minutes and achieved 7.04 METs. Stress terminated due to MPHR(101%) met, dyspnea and fatigue. Overall equivocal stress test. 2. A small to moderate-sized moderate ischemia in the inferior wall extending from the base towards the mid ventricle is evident. LV systolic function was normal at 54% with mild inferior wall hypokinesis. This represents an intermediate risk study. Clinical correlation recommended. Treadmill stress test 1997 Colonoscopy 2008 Endoscopy 2008    Review of Systems Laverda Page, MD; 05/05/2016 5:54 PM) General Not Present- Anorexia, Fatigue and Fever. Respiratory Present- Decreased Exercise Tolerance and Difficulty Breathing on Exertion. Not Present- Cough, Snoring, Wakes up from Sleep Wheezing or Short of Breath and Wheezing. Cardiovascular Not Present- Chest Pain, Claudications, Edema, Orthopnea, Palpitations and Paroxysmal Nocturnal Dyspnea. Gastrointestinal Not Present- Black, Tarry  Stool, Change in Bowel Habits and Nausea. Neurological Not Present- Focal Neurological Symptoms and Syncope. Endocrine Not Present- Cold Intolerance, Excessive Sweating, Heat Intolerance and Thyroid Problems. Hematology Not Present- Anemia, Easy Bruising, Petechiae and Prolonged Bleeding.  Vitals (April Garrison; 05/05/2016 10:55 AM) 05/05/2016 10:49 AM Weight: 216 lb Height: 71in Body Surface Area: 2.18 m Body Mass Index: 30.13 kg/m  Pulse: 58 (Regular)  P.OX: 98% (Room air) BP: 122/82 (Sitting, Left Arm, Standard)       Physical Exam Laverda Page, MD; 05/05/2016 5:54 PM) General Mental Status-Alert. General Appearance-Cooperative and Appears stated age. Build & Nutrition-Well built and Mildly obese.  Head and Neck Thyroid Gland Characteristics - normal size and consistency and no palpable nodules.  Chest and Lung Exam Chest and lung exam reveals -quiet, even and easy respiratory effort with no use of accessory muscles, non-tender and on auscultation, normal breath sounds, no adventitious sounds.  Cardiovascular Cardiovascular examination reveals -normal heart sounds, regular rate and rhythm with no murmurs, carotid auscultation reveals no bruits, abdominal aorta auscultation reveals no bruits and no prominent pulsation, femoral artery auscultation bilaterally reveals normal pulses, no bruits, no thrills, normal pedal pulses bilaterally and no digital clubbing, cyanosis, edema, increased warmth or tenderness.  Abdomen Palpation/Percussion Normal exam - Non Tender and No hepatosplenomegaly.  Neurologic Neurologic evaluation reveals -alert and oriented x 3 with no impairment of recent or remote memory. Motor-Grossly intact without any focal deficits.  Musculoskeletal Global Assessment Left Lower Extremity - no deformities, masses or tenderness, no known fractures. Right Lower Extremity - no deformities, masses or tenderness, no known  fractures.    Assessment & Plan Laverda Page MD; 05/05/2016 5:54 PM) Dyspnea on exertion (R06.09) Story: Chest x-ray 03/13/2016: Low lung volumes, cardiac silhouette upper limits of normal, area of increased density left lower lobe, small left effusion. Mild interstitial prominent markings.  Echocardiogram 04/28/2016: Left ventricle cavity  is normal in size. Mild concentric hypertrophy of the left ventricle. Normal global wall motion. Normal diastolic filling pattern. Calculated EF 55%. Right atrial cavity is mildly dilated. Right ventricle cavity is moderately dilated. Mildly reduced right ventricular function. A moderateor band is noted at the RV apex (normal variant). Mild (Grade I) aortic regurgitation. Mild (Grade I) mitral regurgitation. Trace tricuspid regurgitation. Unable to estimate PA pressure due to absence/minimal TR signal. Current Plans Started AmLODIPine Besylate '5MG'$ , 1 (one) Tablet every evening after dinner, #30, 05/05/2016, Ref. x1. Abnormal EKG (R94.31) Story: Exercise myoview stress 04/23/2016: 1. 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 Poor R-wave progression, cannot exclude anterolateral infarct old. Cannot exclude inferior infarct old. Stress EKG was difficult to interpret due to baseline artifact, however patient did develop more widening in QRS complex morphology with exercise which resolved into recovery. There was nonspecific T abnormality at the end of stress test. Patient exercised on Bruce protocol for 5 minutes and achieved 7.04 METs. Stress terminated due to MPHR(101%) met, dyspnea and fatigue. Overall equivocal stress test. 2. A small to moderate-sized moderate ischemia in the inferior wall extending from the base towards the mid ventricle is evident. LV systolic function was normal at 54% with mild inferior wall hypokinesis. This represents an intermediate risk  study. Clinical correlation recommended. 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 Poor R-wave progression, cannot exclude anterolateral infarct old. Cannot exclude inferior infarct old. Abnormal EKG. Hyperglycemia (R73.9) Hypercholesteremia (E78.00) Aortic atherosclerosis (I70.0) Story: CT abdomen 2015 Hiatal incarcerated S/P surgical repair Abnormal nuclear stress test (R94.39) Future Plans 8/65/7846: METABOLIC PANEL, BASIC (96295) - one time 05/10/2016: CBC & PLATELETS (AUTO) (28413) - one time 05/10/2016: PT (PROTHROMBIN TIME) (24401) - one time  Labwork 05/12/2016: glucose 106, creatinine 1.09, potassium 4.6, CBC normal, PT/INR normal Labs 07/5/017: A1c 5.7%. HB 14.4/HCT 44.0, platelets 180. Serum glucose 103 mg, CMP otherwise normal. BUN 11, serum creatinine 1.15, eGFR 63 mL. Total cholesterol 183, triglycerides 82, HDL 41, LDL 126. TSH normal. Vitamin D 40.2. PSA normal.  Current Plans Patient presenting to me at 3 weeks follow-up fairly acutely over the past 2 months. His echocardiogram reveals right ventricular strain, stress test also reveals inferior wall ischemia. Patient was very unsteady on the treadmill, baseline EKG could not be made about the  Extremely difficult situation, I cannot exclude presence of pulmonary hypertension in view of RV strain and RV dilatation, he has also now developed mild leg edema suggesting right-sided heart failure. Underlying coronary artery disease could also be causing some of the symptoms, hence although he is not on aggressive medical therapy with regard to angina pectoris or anginal equivalent dyspnea, his blood pressure being borderline, I have gingerly started him on 5 mg of amlodipine which may help also pulmonary hypertension and possible angina pectoris. I'll set him up for right and left heart catheterization to evaluate  coronary anatomy. Unless he has major vessel disease, diagnostic only will be performed, however if major vessel CAD is evident, I will proceed with PTCA. Daughter present at the bedside, I'll risk and benefits explained.  CC: Thressa Sheller, MD  Signed by Laverda Page, MD (05/05/2016 5:55 PM)

## 2016-05-18 ENCOUNTER — Encounter (HOSPITAL_COMMUNITY)
Admission: RE | Disposition: A | Discharge status: Home / Self Care | Payer: Self-pay | Source: Ambulatory Visit | Attending: Cardiology

## 2016-05-18 ENCOUNTER — Encounter (HOSPITAL_COMMUNITY): Payer: Self-pay | Admitting: Cardiology

## 2016-05-18 ENCOUNTER — Ambulatory Visit (HOSPITAL_COMMUNITY)
Admission: RE | Admit: 2016-05-18 | Discharge: 2016-05-18 | Disposition: A | Discharge status: Home / Self Care | Payer: Medicare Other | Source: Ambulatory Visit | Attending: Cardiology | Admitting: Cardiology

## 2016-05-18 DIAGNOSIS — N400 Enlarged prostate without lower urinary tract symptoms: Secondary | ICD-10-CM | POA: Insufficient documentation

## 2016-05-18 DIAGNOSIS — E559 Vitamin D deficiency, unspecified: Secondary | ICD-10-CM | POA: Insufficient documentation

## 2016-05-18 DIAGNOSIS — I2510 Atherosclerotic heart disease of native coronary artery without angina pectoris: Secondary | ICD-10-CM | POA: Insufficient documentation

## 2016-05-18 DIAGNOSIS — Z7982 Long term (current) use of aspirin: Secondary | ICD-10-CM | POA: Diagnosis not present

## 2016-05-18 DIAGNOSIS — E785 Hyperlipidemia, unspecified: Secondary | ICD-10-CM | POA: Insufficient documentation

## 2016-05-18 DIAGNOSIS — I700 Atherosclerosis of aorta: Secondary | ICD-10-CM | POA: Insufficient documentation

## 2016-05-18 DIAGNOSIS — R739 Hyperglycemia, unspecified: Secondary | ICD-10-CM | POA: Diagnosis not present

## 2016-05-18 DIAGNOSIS — E7800 Pure hypercholesterolemia, unspecified: Secondary | ICD-10-CM | POA: Diagnosis not present

## 2016-05-18 DIAGNOSIS — I272 Other secondary pulmonary hypertension: Secondary | ICD-10-CM | POA: Diagnosis not present

## 2016-05-18 DIAGNOSIS — R0609 Other forms of dyspnea: Secondary | ICD-10-CM | POA: Diagnosis present

## 2016-05-18 DIAGNOSIS — R0600 Dyspnea, unspecified: Secondary | ICD-10-CM | POA: Diagnosis present

## 2016-05-18 DIAGNOSIS — R931 Abnormal findings on diagnostic imaging of heart and coronary circulation: Secondary | ICD-10-CM | POA: Diagnosis not present

## 2016-05-18 HISTORY — PX: CARDIAC CATHETERIZATION: SHX172

## 2016-05-18 LAB — POCT I-STAT 3, VENOUS BLOOD GAS (G3P V)
Acid-base deficit: 2 mmol/L (ref 0.0–2.0)
Bicarbonate: 24 mEq/L (ref 20.0–24.0)
Bicarbonate: 25.8 mEq/L — ABNORMAL HIGH (ref 20.0–24.0)
O2 Saturation: 70 %
O2 Saturation: 72 %
TCO2: 25 mmol/L (ref 0–100)
TCO2: 27 mmol/L (ref 0–100)
pCO2, Ven: 44 mmHg — ABNORMAL LOW (ref 45.0–50.0)
pCO2, Ven: 47.8 mmHg (ref 45.0–50.0)
pH, Ven: 7.34 — ABNORMAL HIGH (ref 7.250–7.300)
pH, Ven: 7.345 — ABNORMAL HIGH (ref 7.250–7.300)
pO2, Ven: 39 mmHg (ref 31.0–45.0)
pO2, Ven: 41 mmHg (ref 31.0–45.0)

## 2016-05-18 LAB — POCT I-STAT 3, ART BLOOD GAS (G3+)
Bicarbonate: 25.2 mEq/L — ABNORMAL HIGH (ref 20.0–24.0)
O2 Saturation: 97 %
TCO2: 26 mmol/L (ref 0–100)
pCO2 arterial: 42.7 mmHg (ref 35.0–45.0)
pH, Arterial: 7.379 (ref 7.350–7.450)
pO2, Arterial: 95 mmHg (ref 80.0–100.0)

## 2016-05-18 SURGERY — RIGHT/LEFT HEART CATH AND CORONARY ANGIOGRAPHY

## 2016-05-18 MED ORDER — HEPARIN (PORCINE) IN NACL 2-0.9 UNIT/ML-% IJ SOLN
INTRAMUSCULAR | Status: AC
  Filled 2016-05-18: qty 1000

## 2016-05-18 MED ORDER — IOPAMIDOL (ISOVUE-370) INJECTION 76%
INTRAVENOUS | Status: DC | PRN
  Administered 2016-05-18: 50 mL via INTRA_ARTERIAL

## 2016-05-18 MED ORDER — HEPARIN (PORCINE) IN NACL 2-0.9 UNIT/ML-% IJ SOLN
INTRAMUSCULAR | Status: DC | PRN
  Administered 2016-05-18: 1000 mL

## 2016-05-18 MED ORDER — LIDOCAINE HCL (PF) 1 % IJ SOLN
INTRAMUSCULAR | Status: AC
  Filled 2016-05-18: qty 30

## 2016-05-18 MED ORDER — HEPARIN SODIUM (PORCINE) 1000 UNIT/ML IJ SOLN
INTRAMUSCULAR | Status: AC
  Filled 2016-05-18: qty 1

## 2016-05-18 MED ORDER — SODIUM CHLORIDE 0.9 % WEIGHT BASED INFUSION: 3.0000 mL/kg/h | INTRAVENOUS | Status: DC

## 2016-05-18 MED ORDER — SODIUM CHLORIDE 0.9% FLUSH: 3.0000 mL | INTRAVENOUS | Status: DC | PRN

## 2016-05-18 MED ORDER — FENTANYL CITRATE (PF) 100 MCG/2ML IJ SOLN
INTRAMUSCULAR | Status: AC
  Filled 2016-05-18: qty 2

## 2016-05-18 MED ORDER — SODIUM CHLORIDE 0.9% FLUSH: 3.0000 mL | Freq: Two times a day (BID) | INTRAVENOUS | Status: DC

## 2016-05-18 MED ORDER — ASPIRIN 81 MG PO CHEW
CHEWABLE_TABLET | ORAL | Status: AC
  Administered 2016-05-18: 81 mg via ORAL
  Filled 2016-05-18: qty 1

## 2016-05-18 MED ORDER — SODIUM CHLORIDE 0.9 % WEIGHT BASED INFUSION
3.0000 mL/kg/h | INTRAVENOUS | Status: DC
  Administered 2016-05-18: 3 mL/kg/h via INTRAVENOUS

## 2016-05-18 MED ORDER — ASPIRIN 81 MG PO CHEW
81.0000 mg | CHEWABLE_TABLET | ORAL | Status: AC
  Administered 2016-05-18: 81 mg via ORAL

## 2016-05-18 MED ORDER — SODIUM CHLORIDE 0.9 % WEIGHT BASED INFUSION: 1.0000 mL/kg/h | INTRAVENOUS | Status: DC

## 2016-05-18 MED ORDER — FENTANYL CITRATE (PF) 100 MCG/2ML IJ SOLN
INTRAMUSCULAR | Status: DC | PRN
  Administered 2016-05-18: 25 ug via INTRAVENOUS

## 2016-05-18 MED ORDER — LIDOCAINE HCL (PF) 1 % IJ SOLN
INTRAMUSCULAR | Status: DC | PRN
  Administered 2016-05-18: 5 mL via INTRADERMAL

## 2016-05-18 MED ORDER — NITROGLYCERIN 1 MG/10 ML FOR IR/CATH LAB
INTRA_ARTERIAL | Status: AC
  Filled 2016-05-18: qty 10

## 2016-05-18 MED ORDER — MIDAZOLAM HCL 2 MG/2ML IJ SOLN
INTRAMUSCULAR | Status: DC | PRN
  Administered 2016-05-18: 1 mg via INTRAVENOUS

## 2016-05-18 MED ORDER — VERAPAMIL HCL 2.5 MG/ML IV SOLN
INTRA_ARTERIAL | Status: DC | PRN
  Administered 2016-05-18: 5 mL via INTRA_ARTERIAL

## 2016-05-18 MED ORDER — VERAPAMIL HCL 2.5 MG/ML IV SOLN
INTRAVENOUS | Status: AC
  Filled 2016-05-18: qty 2

## 2016-05-18 MED ORDER — MIDAZOLAM HCL 2 MG/2ML IJ SOLN
INTRAMUSCULAR | Status: AC
  Filled 2016-05-18: qty 2

## 2016-05-18 MED ORDER — HEPARIN SODIUM (PORCINE) 1000 UNIT/ML IJ SOLN
INTRAMUSCULAR | Status: DC | PRN
  Administered 2016-05-18: 5000 [IU] via INTRAVENOUS

## 2016-05-18 MED ORDER — IOPAMIDOL (ISOVUE-370) INJECTION 76%
INTRAVENOUS | Status: AC
  Filled 2016-05-18: qty 100

## 2016-05-18 MED ORDER — SODIUM CHLORIDE 0.9 % IV SOLN: 250.0000 mL | INTRAVENOUS | Status: DC | PRN

## 2016-05-18 SURGICAL SUPPLY — 15 items
CATH BALLN WEDGE 5F 110CM (CATHETERS) ×2 IMPLANT
CATH INFINITI 5FR ANG PIGTAIL (CATHETERS) ×2 IMPLANT
CATH OPTITORQUE TIG 4.0 5F (CATHETERS) ×2 IMPLANT
DEVICE RAD COMP TR BAND LRG (VASCULAR PRODUCTS) ×2 IMPLANT
GLIDESHEATH SLEND A-KIT 6F 20G (SHEATH) ×2 IMPLANT
GUIDEWIRE .025 260CM (WIRE) ×2 IMPLANT
KIT HEART LEFT (KITS) ×2 IMPLANT
PACK CARDIAC CATHETERIZATION (CUSTOM PROCEDURE TRAY) ×2 IMPLANT
SHEATH FAST CATH BRACH 5F 5CM (SHEATH) ×2 IMPLANT
TRANSDUCER W/STOPCOCK (MISCELLANEOUS) ×2 IMPLANT
TUBING ART PRESS 72  MALE/FEM (TUBING) ×1
TUBING ART PRESS 72 MALE/FEM (TUBING) ×1 IMPLANT
TUBING CIL FLEX 10 FLL-RA (TUBING) ×2 IMPLANT
WIRE EMERALD 3MM-J .025X260CM (WIRE) ×2 IMPLANT
WIRE SAFE-T 1.5MM-J .035X260CM (WIRE) ×2 IMPLANT

## 2016-05-18 NOTE — Discharge Instructions (Signed)
Radial Site Care °Refer to this sheet in the next few weeks. These instructions provide you with information about caring for yourself after your procedure. Your health care provider may also give you more specific instructions. Your treatment has been planned according to current medical practices, but problems sometimes occur. Call your health care provider if you have any problems or questions after your procedure. °WHAT TO EXPECT AFTER THE PROCEDURE °After your procedure, it is typical to have the following: °· Bruising at the radial site that usually fades within 1-2 weeks. °· Blood collecting in the tissue (hematoma) that may be painful to the touch. It should usually decrease in size and tenderness within 1-2 weeks. °HOME CARE INSTRUCTIONS °· Take medicines only as directed by your health care provider. °· You may shower 24-48 hours after the procedure or as directed by your health care provider. Remove the bandage (dressing) and gently wash the site with plain soap and water. Pat the area dry with a clean towel. Do not rub the site, because this may cause bleeding. °· Do not take baths, swim, or use a hot tub until your health care provider approves. °· Check your insertion site every day for redness, swelling, or drainage. °· Do not apply powder or lotion to the site. °· Do not flex or bend the affected arm for 24 hours or as directed by your health care provider. °· Do not push or pull heavy objects with the affected arm for 24 hours or as directed by your health care provider. °· Do not lift over 10 lb (4.5 kg) for 5 days after your procedure or as directed by your health care provider. °· Ask your health care provider when it is okay to: °¨ Return to work or school. °¨ Resume usual physical activities or sports. °¨ Resume sexual activity. °· Do not drive home if you are discharged the same day as the procedure. Have someone else drive you. °· You may drive 24 hours after the procedure unless otherwise  instructed by your health care provider. °· Do not operate machinery or power tools for 24 hours after the procedure. °· If your procedure was done as an outpatient procedure, which means that you went home the same day as your procedure, a responsible adult should be with you for the first 24 hours after you arrive home. °· Keep all follow-up visits as directed by your health care provider. This is important. °SEEK MEDICAL CARE IF: °· You have a fever. °· You have chills. °· You have increased bleeding from the radial site. Hold pressure on the site. °SEEK IMMEDIATE MEDICAL CARE IF: °· You have unusual pain at the radial site. °· You have redness, warmth, or swelling at the radial site. °· You have drainage (other than a small amount of blood on the dressing) from the radial site. °· The radial site is bleeding, and the bleeding does not stop after 30 minutes of holding steady pressure on the site. °· Your arm or hand becomes pale, cool, tingly, or numb. °  °This information is not intended to replace advice given to you by your health care provider. Make sure you discuss any questions you have with your health care provider. °  °Document Released: 10/30/2010 Document Revised: 10/18/2014 Document Reviewed: 04/15/2014 °Elsevier Interactive Patient Education ©2016 [employer] ° °

## 2016-05-18 NOTE — Interval H&P Note (Signed)
History and Physical Interval Note:  05/18/2016 9:22 AM  Troy Sanchez  has presented today for surgery, with the diagnosis of abnormal stress - shortness of breath  The various methods of treatment have been discussed with the patient and family. After consideration of risks, benefits and other options for treatment, the patient has consented to  Procedure(s): Right/Left Heart Cath and Coronary Angiography (N/A) and possible PCI as a surgical intervention .  The patient's history has been reviewed, patient examined, no change in status, stable for surgery.  I have reviewed the patient's chart and labs.  Questions were answered to the patient's satisfaction.   Ischemic Symptoms? CCS III (Marked limitation of ordinary activity) Anti-ischemic Medical Therapy? Minimal Therapy (1 class of medications) Non-invasive Test Results? Intermediate-risk stress test findings: cardiac mortality 1-3%/year Prior CABG? No Previous CABG   Patient Information:   1-2V CAD, no prox LAD  U (6)  Indication: 16; Score: 6   Patient Information:   CTO of 1 vessel, no other CAD  U (6)  Indication: 26; Score: 6   Patient Information:   1V CAD with prox LAD  A (7)  Indication: 32; Score: 7   Patient Information:   2V-CAD with prox LAD  A (8)  Indication: 38; Score: 8   Patient Information:   3V-CAD without LMCA  A (8)  Indication: 44; Score: 8   Patient Information:   3V-CAD without LMCA With Abnormal LV systolic function  A (9)  Indication: 48; Score: 9   Patient Information:   LMCA-CAD  A (9)  Indication: 49; Score: 9   Patient Information:   2V-CAD with prox LAD PCI  A (7)  Indication: 62; Score: 7   Patient Information:   2V-CAD with prox LAD CABG  A (8)  Indication: 62; Score: 8   Patient Information:   3V-CAD without LMCA With Low CAD burden(i.e., 3 focal stenoses, low SYNTAX score) PCI  A (7)  Indication: 63; Score: 7   Patient Information:   3V-CAD  without LMCA With Low CAD burden(i.e., 3 focal stenoses, low SYNTAX score) CABG  A (9)  Indication: 63; Score: 9   Patient Information:   3V-CAD without LMCA E06c - Intermediate-high CAD burden (i.e., multiple diffuse lesions, presence of CTO, or high SYNTAX score) PCI  U (4)  Indication: 64; Score: 4   Patient Information:   3V-CAD without LMCA E06c - Intermediate-high CAD burden (i.e., multiple diffuse lesions, presence of CTO, or high SYNTAX score) CABG  A (9)  Indication: 64; Score: 9   Patient Information:   LMCA-CAD With Isolated LMCA stenosis  PCI  U (6)  Indication: 65; Score: 6   Patient Information:   LMCA-CAD Additional CAD, low CAD burden (i.e., 1- to 2-vessel additional involvement, low SYNTAX score) PCI  U (5)  Indication: 66; Score: 5   Patient Information:   LMCA-CAD Additional CAD, low CAD burden (i.e., 1- to 2-vessel additional involvement, low SYNTAX score) CABG  A (9)  Indication: 66; Score: 9   Patient Information:   LMCA-CAD With Isolated LMCA stenosis  CABG  A (9)  Indication: 66; Score: 9   Patient Information:   LMCA-CAD Additional CAD, intermediate-high CAD burden (i.e., 3-vessel involvement, presence of CTO, or high SYNTAX score) PCI  I (3)  Indication: 67; Score: 3   Patient Information:   LMCA-CAD Additional CAD, intermediate-high CAD burden (i.e., 3-vessel involvement, presence of CTO, or high SYNTAX score) CABG  A (9)  Indication: 67; Score:  [place]

## 2016-05-23 IMAGING — CT CT ABD-PELV W/ CM
2 of 5 series · 16 of 46 positions shown, 18 images · IV contrast (Omni 300)
Comparison: None.

CLINICAL DATA: Epigastric pain with nausea.

EXAM:
CT ABDOMEN AND PELVIS WITH CONTRAST
TECHNIQUE: Multidetector CT imaging of the abdomen and pelvis was performed
using the standard protocol following bolus administration of
intravenous contrast.
CONTRAST:  100mL OMNIPAQUE IOHEXOL 300 MG/ML SOLN, 25mL OMNIPAQUE
IOHEXOL 300 MG/ML SOLN

[Series 2: abd/ pelvis 5.0 i30f 1 · axial · 0.77mm/px · z∈[-10,+456]mm · 13 of 108 slices shown, 15 images]
[im 7/108  soft-tissue]
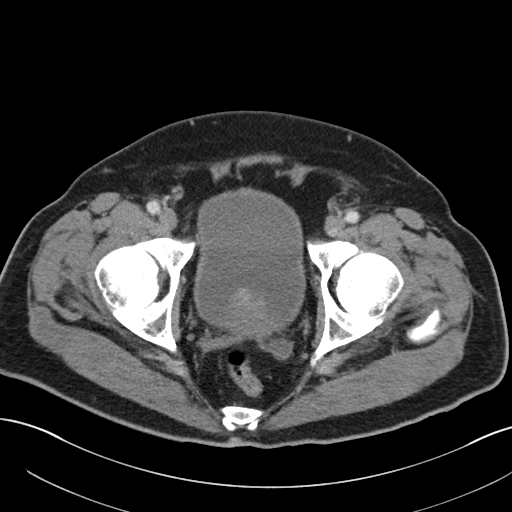
[im 7/108  bone]
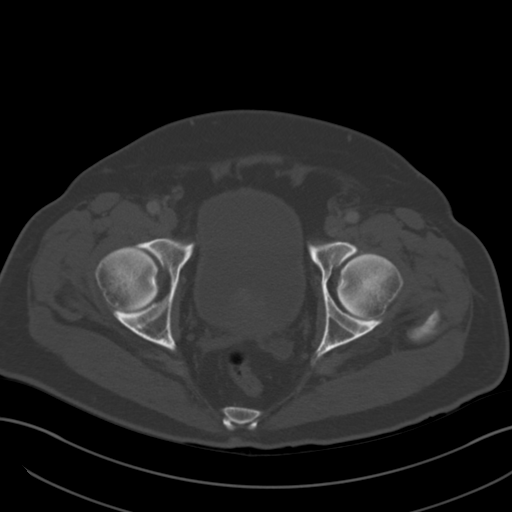
[im 13/108  soft-tissue]
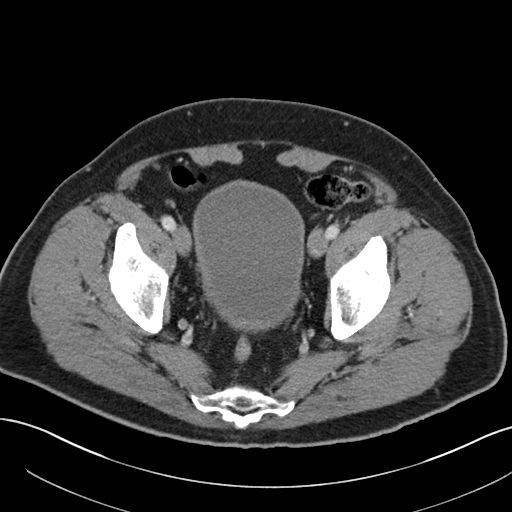
[im 26/108  soft-tissue]
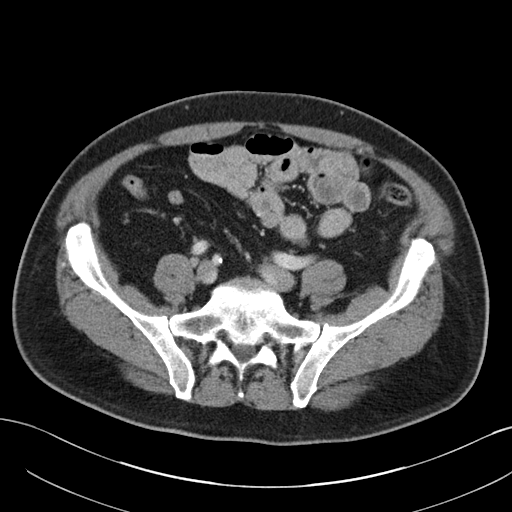
[im 32/108  soft-tissue]
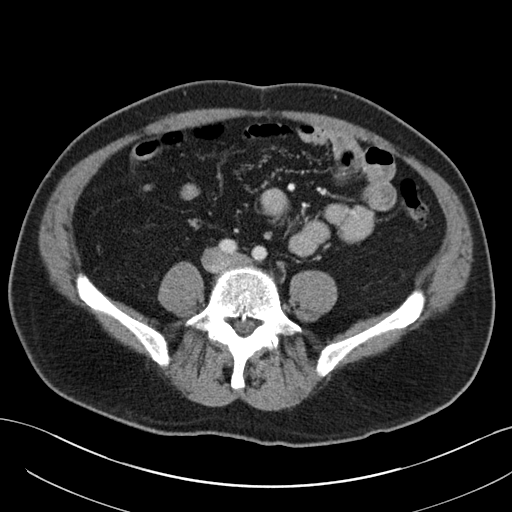
[im 38/108  soft-tissue]
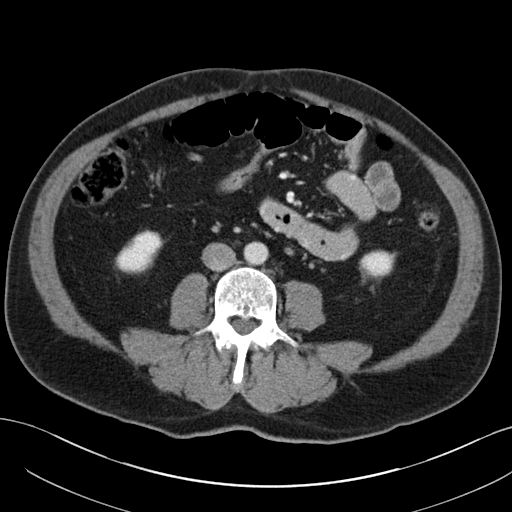
[im 45/108  soft-tissue]
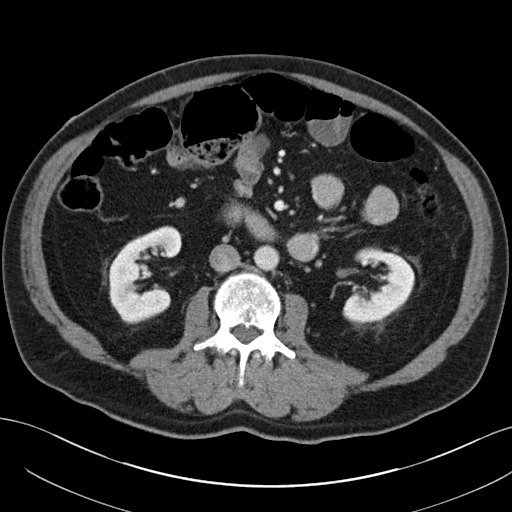
[im 57/108  soft-tissue]
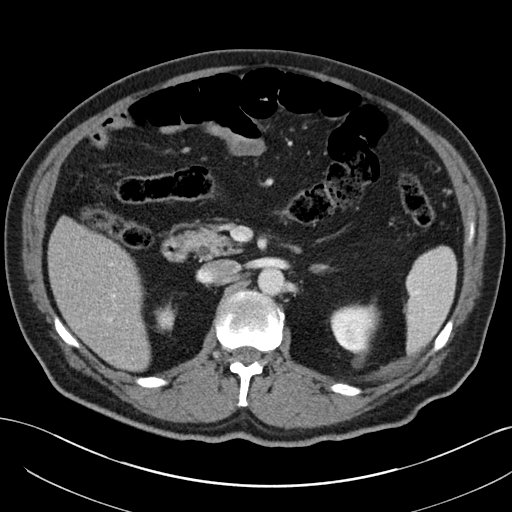
[im 63/108  soft-tissue]
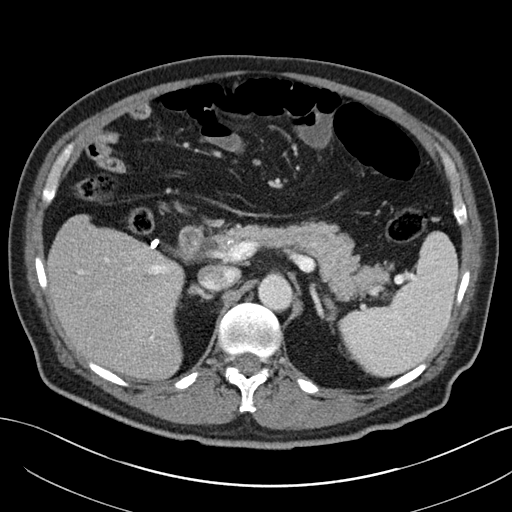
[im 70/108  soft-tissue]
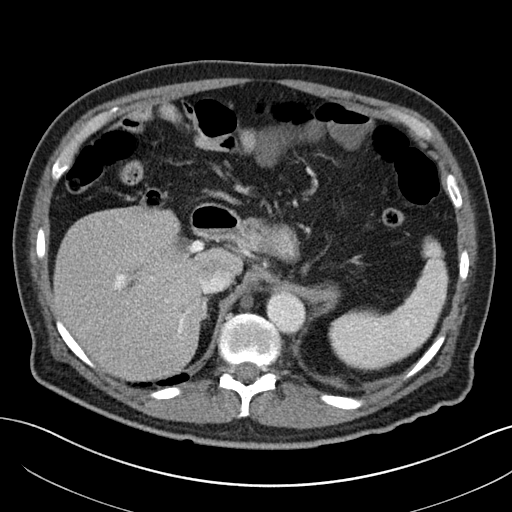
[im 70/108  bone]
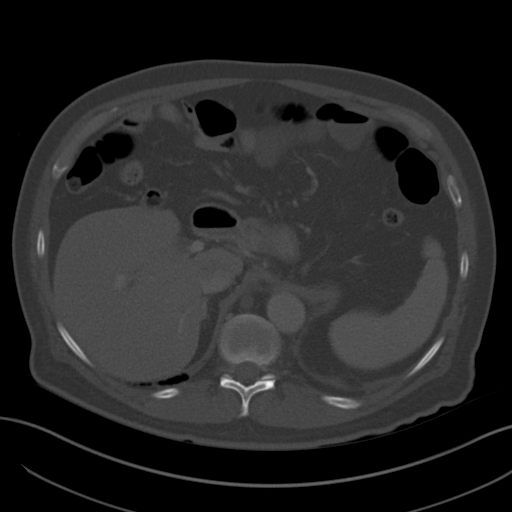
[im 76/108  soft-tissue]
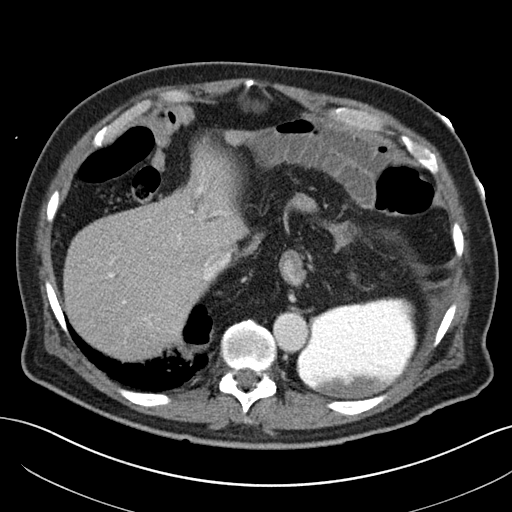
[im 82/108  soft-tissue]
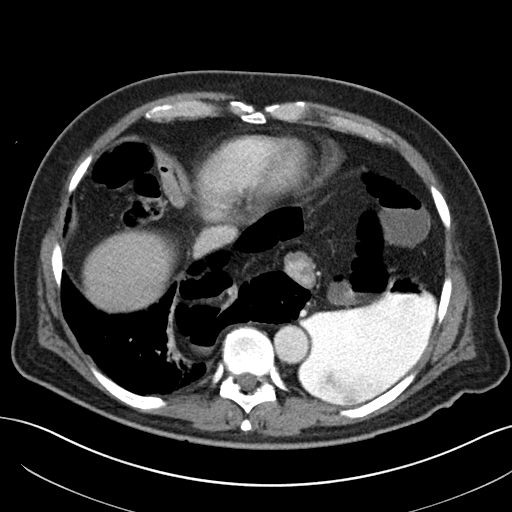
[im 95/108  soft-tissue]
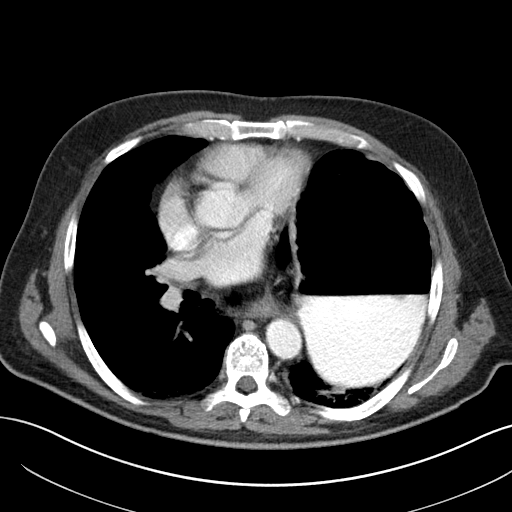
[im 101/108  soft-tissue]
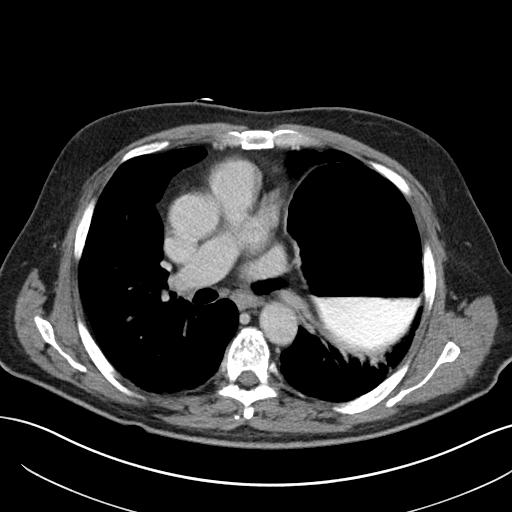

[Series 602: coronal;s · coronal · 1.04mm/px · 3 of 96 slices shown]
[im 32/96  soft-tissue]
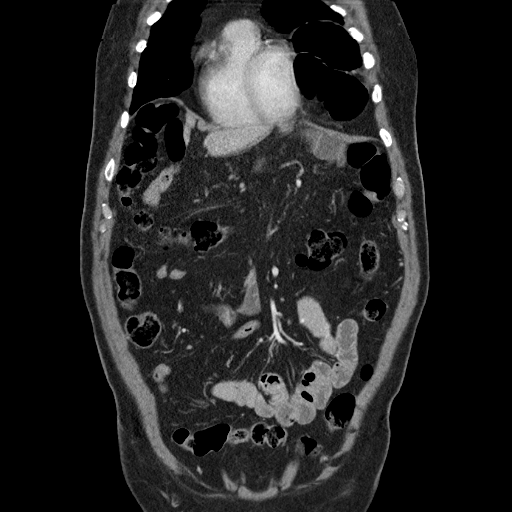
[im 43/96  soft-tissue]
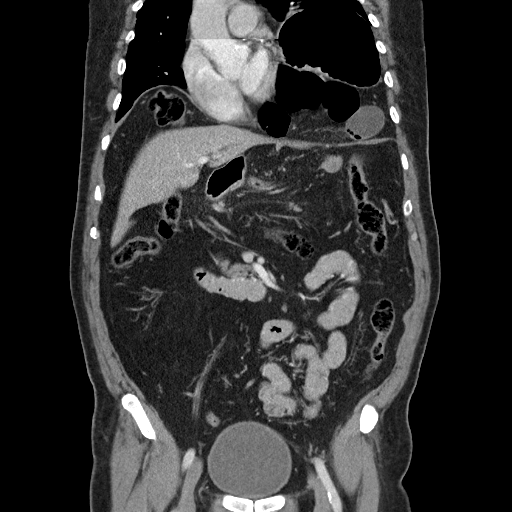
[im 53/96  soft-tissue]
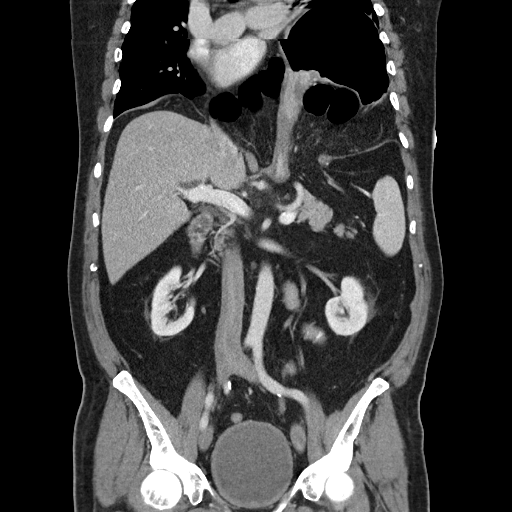

[16 of 46 positions shown; findings below may reference images not displayed]

FINDINGS: Massive hiatal hernia, containing the entire stomach, multiple loops
of fluid filled mildly distended small bowel with transition point
along the left crus of the diaphragm, coronal 48/96. Small bowel
within the hernia sac measures up to 3.4 cm in transaxial dimension.
Hernia sac resultant compressive atelectasis. Mild mass effect on
the left heart border, due to the hernia sac.

The liver, spleen, and adrenal glands are unremarkable. Status post
cholecystectomy. A few punctate calcifications within the pancreatic
head, without discrete mass.

The large bowel are normal in course and caliber without
inflammatory changes. A few colonic diverticula with mild amount of
retained large bowel stool. Normal appendix. No intraperitoneal free
fluid nor free air.

Kidneys are orthotopic, demonstrating symmetric enhancement without
hydronephrosis or renal masses. Bilateral nonobstructing
nephrolithiasis measure up to 3 mm. The unopacified ureters are
normal in course and caliber. Delayed imaging through the kidneys
demonstrates symmetric prompt excretion to the proximal urinary
collecting system. Urinary bladder is well distended, dense. 2.5 cm
mass at bladder trigone contiguous with the prostate, which is
enlarged, 5.9 cm in transaxial dimension.

Aortoiliac vessels are normal in course and caliber with trace
calcific atherosclerosis. No lymphadenopathy by CT size criteria.
Internal reproductive organs are unremarkable. The soft tissues and
included osseous structures are nonsuspicious. Small fat containing
umbilical hernia.
IMPRESSION: Massive hiatal hernia, containing the entire stomach, and associated
partial small bowel obstruction, with transition point in the left
upper quadrant, along the crus of the left diaphragm.

Bilateral nonobstructing nephrolithiasis.

2.5 cm mass at bladder trigone likely reflects prostatic invasion
with associated prostatomegaly though, this less likely reflects
hematoma.

  By: Arroteia Albergo

## 2016-05-25 IMAGING — CR DG ABDOMEN 2V
3 series · 3 of 3 positions shown · non-contrast
Comparison: CT 03/03/2014.

CLINICAL DATA: Abdominal pain and distention. follow up small bowel
obstruction.

EXAM:
ABDOMEN - 2 VIEW

[w abdomen upright (1 of 2)]
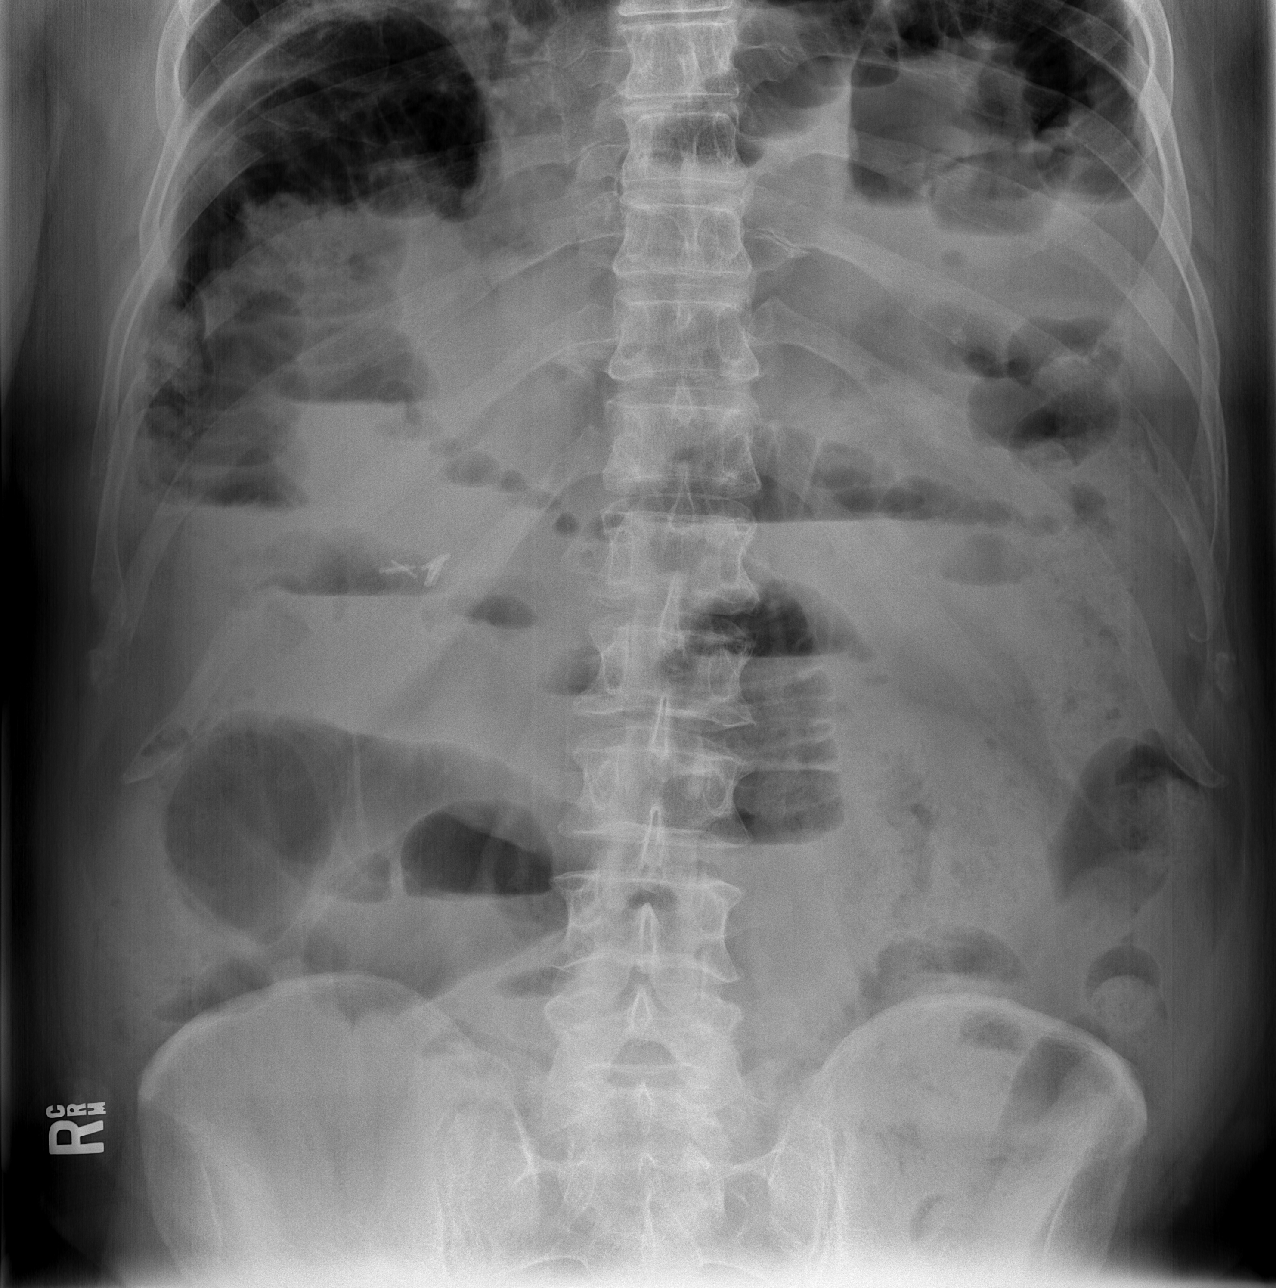

[w abdomen upright (2 of 2)]
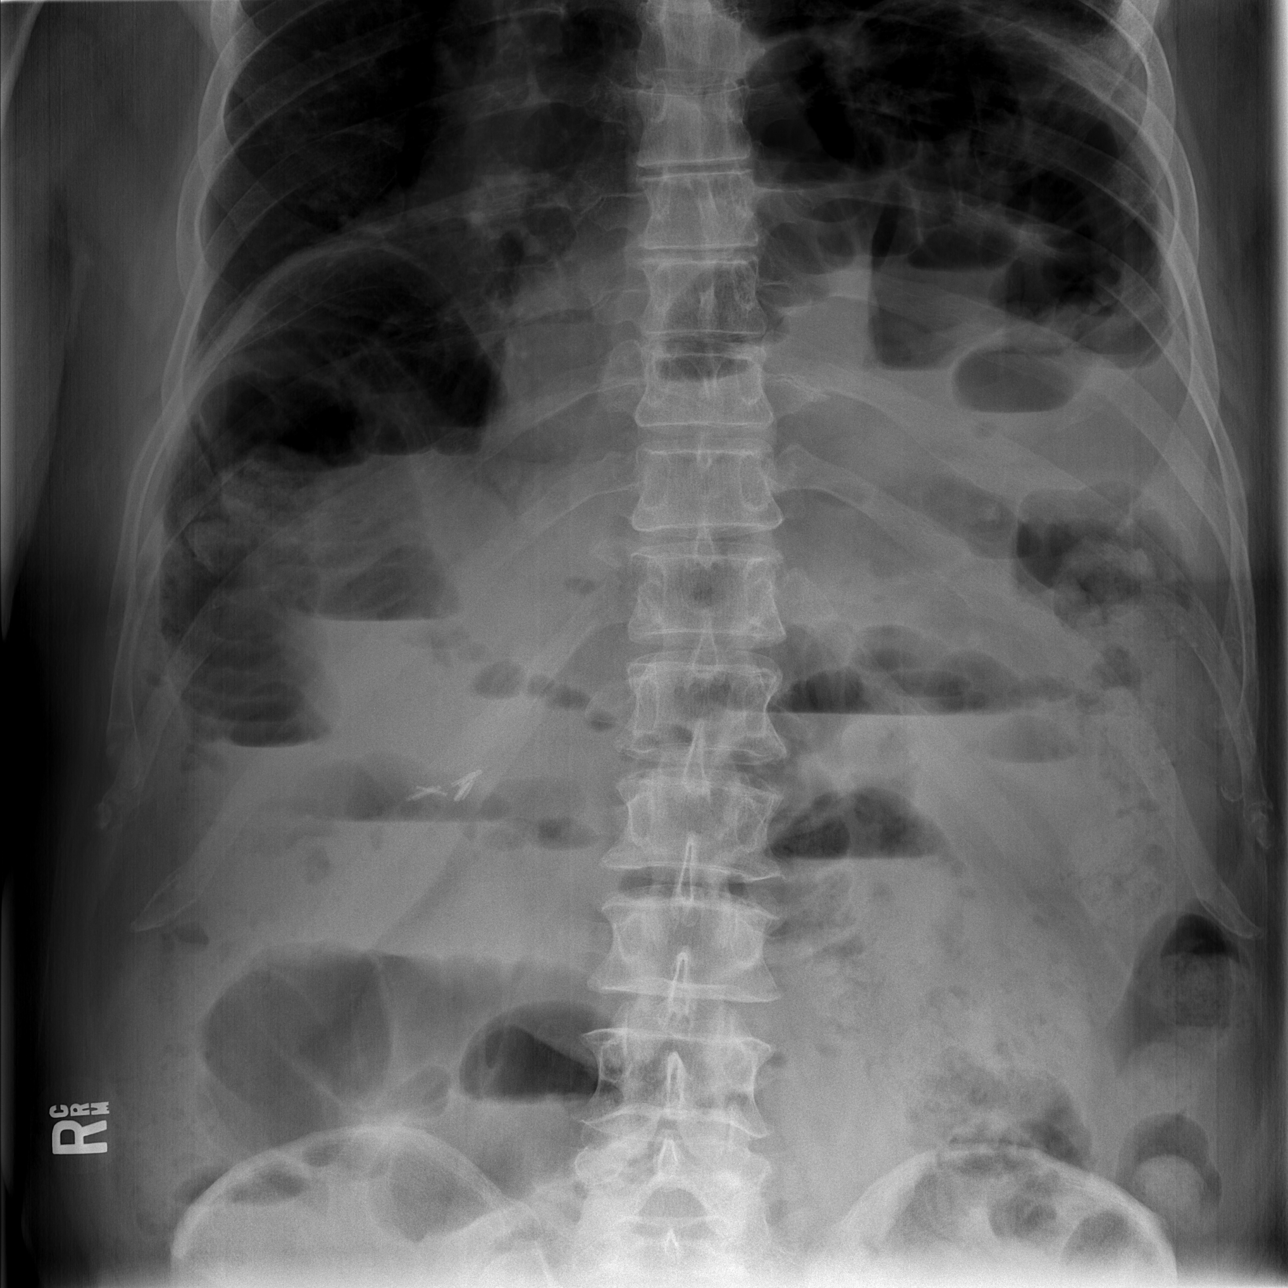

[t abdomen supine]
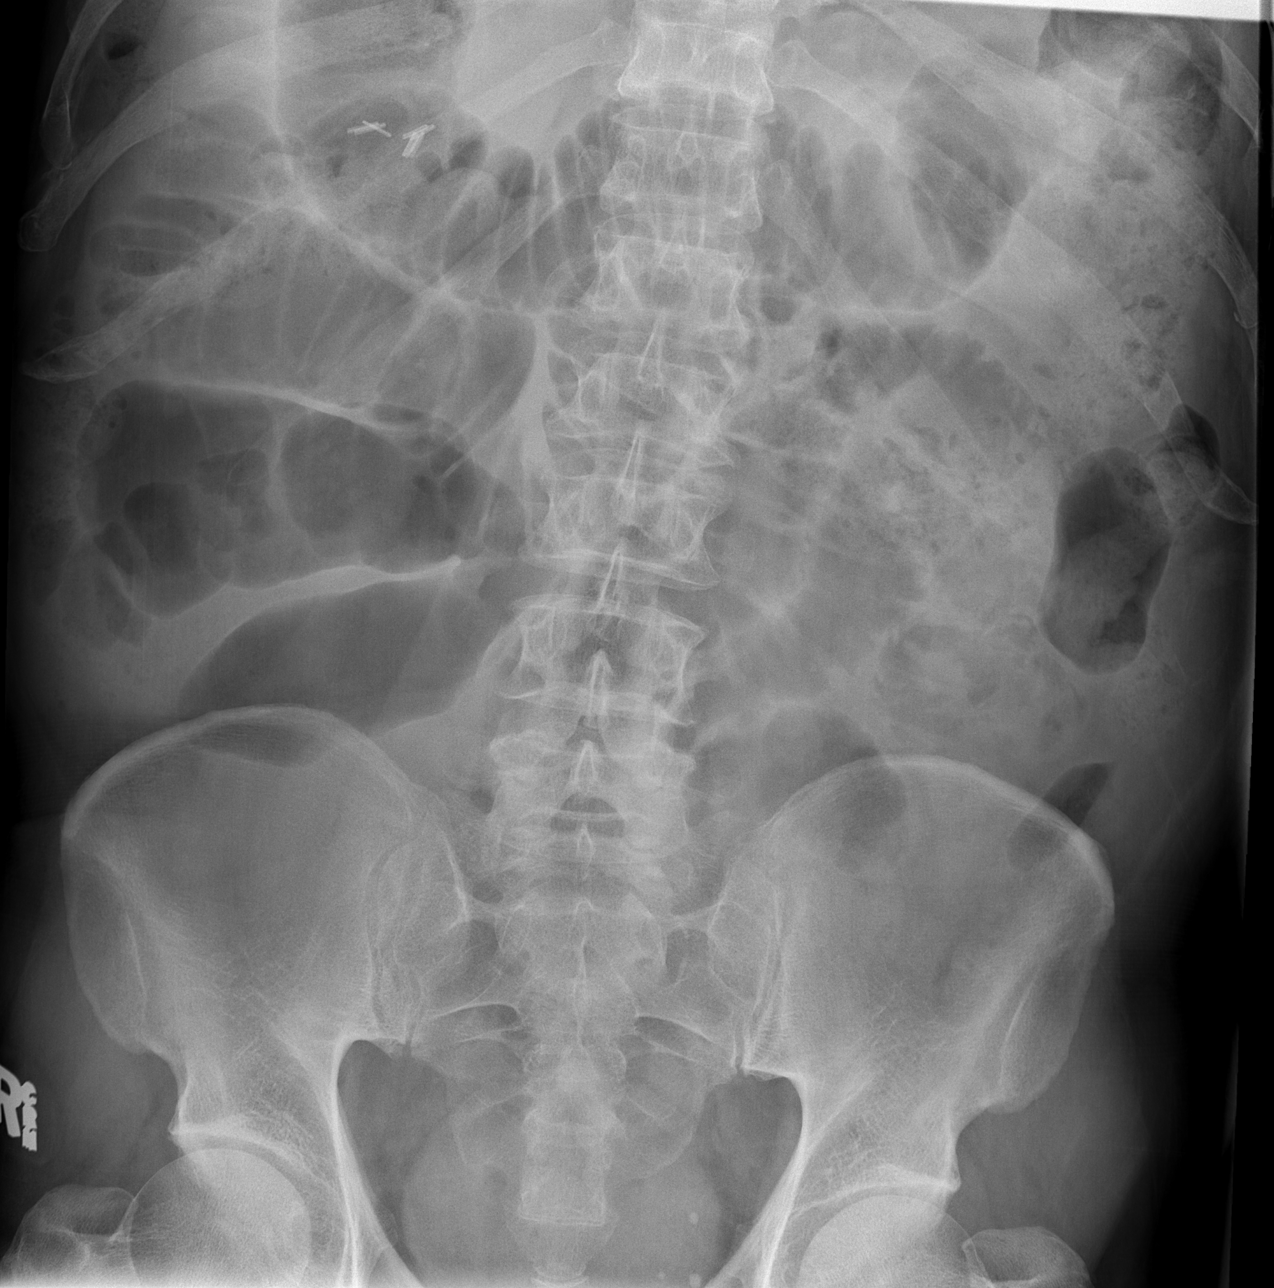

[3 of 3 positions shown; findings below may reference images not displayed]

FINDINGS: There is progressive diffuse small bowel distension with multiple
air-fluid levels on the erect examination. The colon remains
relatively decompressed. The hiatal hernia is incompletely
visualized. There is no free intraperitoneal air. Cholecystectomy
clips and pelvic phleboliths are stable.
IMPRESSION: Worsening small bowel distension with multiple air-fluid levels
consistent with small bowel obstruction. Patient may benefit from NG
tube decompression.

## 2016-05-28 DIAGNOSIS — I2510 Atherosclerotic heart disease of native coronary artery without angina pectoris: Secondary | ICD-10-CM | POA: Diagnosis not present

## 2016-05-28 DIAGNOSIS — R9431 Abnormal electrocardiogram [ECG] [EKG]: Secondary | ICD-10-CM | POA: Diagnosis not present

## 2016-05-28 DIAGNOSIS — R0609 Other forms of dyspnea: Secondary | ICD-10-CM | POA: Diagnosis not present

## 2016-05-28 DIAGNOSIS — I272 Other secondary pulmonary hypertension: Secondary | ICD-10-CM | POA: Diagnosis not present

## 2016-07-12 DIAGNOSIS — L989 Disorder of the skin and subcutaneous tissue, unspecified: Secondary | ICD-10-CM | POA: Diagnosis not present

## 2016-07-26 ENCOUNTER — Other Ambulatory Visit: Payer: Self-pay | Admitting: Dermatology

## 2016-07-26 DIAGNOSIS — L309 Dermatitis, unspecified: Secondary | ICD-10-CM | POA: Diagnosis not present

## 2016-07-26 DIAGNOSIS — C44229 Squamous cell carcinoma of skin of left ear and external auricular canal: Secondary | ICD-10-CM | POA: Diagnosis not present

## 2016-07-26 DIAGNOSIS — D239 Other benign neoplasm of skin, unspecified: Secondary | ICD-10-CM | POA: Diagnosis not present

## 2016-07-26 DIAGNOSIS — C44212 Basal cell carcinoma of skin of right ear and external auricular canal: Secondary | ICD-10-CM | POA: Diagnosis not present

## 2016-08-06 DIAGNOSIS — R972 Elevated prostate specific antigen [PSA]: Secondary | ICD-10-CM | POA: Diagnosis not present

## 2016-08-09 DIAGNOSIS — R0609 Other forms of dyspnea: Secondary | ICD-10-CM | POA: Diagnosis not present

## 2016-08-09 DIAGNOSIS — E7800 Pure hypercholesterolemia, unspecified: Secondary | ICD-10-CM | POA: Diagnosis not present

## 2016-08-13 DIAGNOSIS — N400 Enlarged prostate without lower urinary tract symptoms: Secondary | ICD-10-CM | POA: Diagnosis not present

## 2016-08-13 DIAGNOSIS — R972 Elevated prostate specific antigen [PSA]: Secondary | ICD-10-CM | POA: Diagnosis not present

## 2016-08-19 DIAGNOSIS — R0609 Other forms of dyspnea: Secondary | ICD-10-CM | POA: Diagnosis not present

## 2016-08-19 DIAGNOSIS — I10 Essential (primary) hypertension: Secondary | ICD-10-CM | POA: Diagnosis not present

## 2016-08-19 DIAGNOSIS — I2510 Atherosclerotic heart disease of native coronary artery without angina pectoris: Secondary | ICD-10-CM | POA: Diagnosis not present

## 2016-08-19 DIAGNOSIS — E7800 Pure hypercholesterolemia, unspecified: Secondary | ICD-10-CM | POA: Diagnosis not present

## 2016-08-24 DIAGNOSIS — C44222 Squamous cell carcinoma of skin of right ear and external auricular canal: Secondary | ICD-10-CM | POA: Diagnosis not present

## 2016-08-24 DIAGNOSIS — C44219 Basal cell carcinoma of skin of left ear and external auricular canal: Secondary | ICD-10-CM | POA: Diagnosis not present

## 2016-10-13 DIAGNOSIS — R7309 Other abnormal glucose: Secondary | ICD-10-CM | POA: Diagnosis not present

## 2016-10-13 DIAGNOSIS — E559 Vitamin D deficiency, unspecified: Secondary | ICD-10-CM | POA: Diagnosis not present

## 2016-10-13 DIAGNOSIS — N400 Enlarged prostate without lower urinary tract symptoms: Secondary | ICD-10-CM | POA: Diagnosis not present

## 2016-10-13 DIAGNOSIS — E785 Hyperlipidemia, unspecified: Secondary | ICD-10-CM | POA: Diagnosis not present

## 2016-10-13 DIAGNOSIS — N182 Chronic kidney disease, stage 2 (mild): Secondary | ICD-10-CM | POA: Diagnosis not present

## 2016-10-20 DIAGNOSIS — R7309 Other abnormal glucose: Secondary | ICD-10-CM | POA: Diagnosis not present

## 2016-10-20 DIAGNOSIS — E785 Hyperlipidemia, unspecified: Secondary | ICD-10-CM | POA: Diagnosis not present

## 2016-10-20 DIAGNOSIS — N400 Enlarged prostate without lower urinary tract symptoms: Secondary | ICD-10-CM | POA: Diagnosis not present

## 2016-10-20 DIAGNOSIS — E559 Vitamin D deficiency, unspecified: Secondary | ICD-10-CM | POA: Diagnosis not present

## 2016-11-22 DIAGNOSIS — L821 Other seborrheic keratosis: Secondary | ICD-10-CM | POA: Diagnosis not present

## 2016-11-22 DIAGNOSIS — Z85828 Personal history of other malignant neoplasm of skin: Secondary | ICD-10-CM | POA: Diagnosis not present

## 2016-11-22 DIAGNOSIS — M25461 Effusion, right knee: Secondary | ICD-10-CM | POA: Diagnosis not present

## 2016-11-22 DIAGNOSIS — L570 Actinic keratosis: Secondary | ICD-10-CM | POA: Diagnosis not present

## 2016-11-22 DIAGNOSIS — M25561 Pain in right knee: Secondary | ICD-10-CM | POA: Diagnosis not present

## 2017-03-15 DIAGNOSIS — H04123 Dry eye syndrome of bilateral lacrimal glands: Secondary | ICD-10-CM | POA: Diagnosis not present

## 2017-03-15 DIAGNOSIS — H524 Presbyopia: Secondary | ICD-10-CM | POA: Diagnosis not present

## 2017-03-15 DIAGNOSIS — H2513 Age-related nuclear cataract, bilateral: Secondary | ICD-10-CM | POA: Diagnosis not present

## 2017-04-11 DIAGNOSIS — I272 Pulmonary hypertension, unspecified: Secondary | ICD-10-CM | POA: Diagnosis not present

## 2017-04-11 DIAGNOSIS — N183 Chronic kidney disease, stage 3 (moderate): Secondary | ICD-10-CM | POA: Diagnosis not present

## 2017-04-11 DIAGNOSIS — E559 Vitamin D deficiency, unspecified: Secondary | ICD-10-CM | POA: Diagnosis not present

## 2017-04-11 DIAGNOSIS — Z0000 Encounter for general adult medical examination without abnormal findings: Secondary | ICD-10-CM | POA: Diagnosis not present

## 2017-04-11 DIAGNOSIS — E785 Hyperlipidemia, unspecified: Secondary | ICD-10-CM | POA: Diagnosis not present

## 2017-04-18 DIAGNOSIS — E785 Hyperlipidemia, unspecified: Secondary | ICD-10-CM | POA: Diagnosis not present

## 2017-04-18 DIAGNOSIS — E559 Vitamin D deficiency, unspecified: Secondary | ICD-10-CM | POA: Diagnosis not present

## 2017-04-18 DIAGNOSIS — R7309 Other abnormal glucose: Secondary | ICD-10-CM | POA: Diagnosis not present

## 2017-04-18 DIAGNOSIS — R7301 Impaired fasting glucose: Secondary | ICD-10-CM | POA: Diagnosis not present

## 2017-04-18 DIAGNOSIS — N400 Enlarged prostate without lower urinary tract symptoms: Secondary | ICD-10-CM | POA: Diagnosis not present

## 2017-04-18 DIAGNOSIS — Z0000 Encounter for general adult medical examination without abnormal findings: Secondary | ICD-10-CM | POA: Diagnosis not present

## 2017-08-23 DIAGNOSIS — R972 Elevated prostate specific antigen [PSA]: Secondary | ICD-10-CM | POA: Diagnosis not present

## 2017-08-29 DIAGNOSIS — N400 Enlarged prostate without lower urinary tract symptoms: Secondary | ICD-10-CM | POA: Diagnosis not present

## 2017-08-29 DIAGNOSIS — R972 Elevated prostate specific antigen [PSA]: Secondary | ICD-10-CM | POA: Diagnosis not present

## 2017-08-29 DIAGNOSIS — Z87442 Personal history of urinary calculi: Secondary | ICD-10-CM | POA: Diagnosis not present

## 2017-09-05 DIAGNOSIS — I2510 Atherosclerotic heart disease of native coronary artery without angina pectoris: Secondary | ICD-10-CM | POA: Diagnosis not present

## 2017-09-05 DIAGNOSIS — R0609 Other forms of dyspnea: Secondary | ICD-10-CM | POA: Diagnosis not present

## 2017-09-05 DIAGNOSIS — I272 Pulmonary hypertension, unspecified: Secondary | ICD-10-CM | POA: Diagnosis not present

## 2017-09-05 DIAGNOSIS — E7800 Pure hypercholesterolemia, unspecified: Secondary | ICD-10-CM | POA: Diagnosis not present

## 2017-09-21 ENCOUNTER — Institutional Professional Consult (permissible substitution): Payer: Medicare Other | Admitting: Internal Medicine

## 2017-10-18 ENCOUNTER — Institutional Professional Consult (permissible substitution): Payer: Medicare Other | Admitting: Internal Medicine

## 2017-10-31 DIAGNOSIS — R0609 Other forms of dyspnea: Secondary | ICD-10-CM | POA: Diagnosis not present

## 2017-11-04 ENCOUNTER — Ambulatory Visit (INDEPENDENT_AMBULATORY_CARE_PROVIDER_SITE_OTHER)
Admission: RE | Admit: 2017-11-04 | Discharge: 2017-11-04 | Disposition: A | Discharge status: Home / Self Care | Payer: Medicare Other | Source: Ambulatory Visit | Attending: Internal Medicine | Admitting: Internal Medicine

## 2017-11-04 ENCOUNTER — Encounter: Payer: Self-pay | Admitting: Internal Medicine

## 2017-11-04 ENCOUNTER — Ambulatory Visit: Payer: Medicare Other | Admitting: Internal Medicine

## 2017-11-04 VITALS — BP 108/70 | HR 70 | Ht 71.0 in | Wt 206.0 lb

## 2017-11-04 DIAGNOSIS — R0609 Other forms of dyspnea: Secondary | ICD-10-CM | POA: Diagnosis not present

## 2017-11-04 DIAGNOSIS — R0602 Shortness of breath: Secondary | ICD-10-CM | POA: Diagnosis not present

## 2017-11-04 DIAGNOSIS — R0600 Dyspnea, unspecified: Secondary | ICD-10-CM

## 2017-11-04 NOTE — Patient Instructions (Signed)
Please remember to go to the  x-ray department downstairs in the basement  for your tests - we will call you with the results when they are available.      Please schedule a follow up office visit in 4 weeks, sooner if needed with full pfts on return

## 2017-11-04 NOTE — Progress Notes (Signed)
Subjective:     Patient ID: Troy Sanchez, male   DOB: May 12, 1941,    MRN: [id number]  HPI  49 yowm never smoker with onset of doe around late spring  2017  > referred to [facility] who dx [facility] by [place] 05/2016 and concerned about crackles on exam 10/31/17 so referred to pulmonary clinic 11/04/2017 by Dr   Nadyne Coombes    11/04/2017 1st [facility] office visit/ [name]   Chief Complaint  Patient presents with  . Pulmonary Consult    Referred by Dr. Einar Gip for eval of pulmonary hypertension.  He states he has had DOE for the past 2 yrs. He states he only gets winded when he walks up an incline.    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 No unusual exposure hx or h/o childhood pna/ asthma or knowledge of premature birth.  Sleeping ok flat without nocturnal  or early am exacerbation  of respiratory  c/o's or need for noct saba. Also denies any obvious fluctuation of symptoms with weather or environmental changes or other aggravating or alleviating factors except as outlined above   Current Allergies, Complete Past Medical History, Past Surgical History, Family History, and Social History were reviewed in [employer] record.  ROS  The following are not active complaints unless bolded Hoarseness, sore throat, dysphagia, dental problems, itching, sneezing,  nasal congestion or discharge of excess mucus or purulent secretions, ear ache,   fever, chills, sweats, unintended wt loss or wt gain, classically pleuritic or exertional cp,  orthopnea pnd or leg swelling, presyncope, palpitations, abdominal pain, anorexia, nausea, vomiting, diarrhea  or  change in bowel habits or change in bladder habits, change in stools or change in urine, dysuria, hematuria,  rash, arthralgias, visual complaints, headache, numbness, weakness or ataxia or problems with walking or coordination,  change in mood/affect or memory.        Current Meds  Medication Sig  . amLODipine (NORVASC) 5 MG tablet Take 5 mg by mouth daily.  Marland Kitchen aspirin 81 MG chewable tablet Chew 81 mg by mouth every other day.   Marland Kitchen atorvastatin (LIPITOR) 10 MG tablet Take 10 mg by mouth daily.  . cholecalciferol (VITAMIN D) 1000 UNITS tablet Take 1,000 Units by mouth every morning.   . folic acid (FOLVITE) 409 MCG tablet Take 800 mcg by mouth daily.  . Multiple Vitamins-Minerals (MULTIVITAMIN WITH MINERALS) tablet Take 1 tablet by mouth every morning.   . Omega-3 Fatty Acids (FISH OIL) 1000 MG CAPS Take 1,000 mg by mouth every morning.                Review of Systems     Objective:   Physical Exam    amb slt hoarse wm nad   Wt Readings from Last 3 Encounters:  11/04/17 206 lb (93.4 kg)  05/18/16 215 lb (97.5 kg)  08/15/14 203 lb 14.8 oz (92.5 kg)     Vital signs reviewed - Note on arrival 02 sats  95% on RA   Full dentures slt coarse bs with a few crackles on insp  HEENT: nl  , turbinates bilaterally, and oropharynx. Nl external ear canals without cough reflex - full dentures    NECK :  without JVD/Nodes/TM/ nl carotid upstrokes bilaterally   LUNGS: no acc muscle use,   Mild pectus excavatum, slt coarsened bs bilaterally withy a few coarse insp crackles in base s cough induced even on deep insp    CV:  RRR  no s3 or murmur  - No  increase in P2, and no edema   ABD:  soft and nontender with nl inspiratory excursion in the supine position. No bruits or organomegaly appreciated, bowel sounds nl  MS:  Nl gait/ ext warm without deformities, calf tenderness, cyanosis  No  clubbing No obvious joint restrictions   SKIN: warm and dry without lesions    NEURO:   alert, approp, nl sensorium with  no motor or cerebellar deficits apparent.     CXR PA and Lateral:   11/04/2017 :    I personally reviewed images and agree with radiology impression as follows:   Mild bibasilar atelectasis/scarring.  My review:  No  def ILD                Assessment:

## 2017-11-04 NOTE — Assessment & Plan Note (Addendum)
[place] 05/18/16 with PA mean  17 and wedge 5 and CO  = 5.65  So PVR = 12/5.65 x 80 = 170 (wnl) Echo 10/31/17 :  Low nl with borderline global hypokinesis ef 50-55% and Mild LAE/ RVE / trace MR  - no change vs 04/28/16  - 11/04/2017  Walked RA x 3 laps @ 185 ft each stopped due to  End of study, fast pace, no sob or desat     He really doesn't have significant PH much less PAH but does have  New sense of sob with mild crackles on insp and could have early ILD   Therefore rec return for full pfts/ overnight ox and hrct chest if indicated using the " multiple points on the curve" concept.  Discussed in detail all the  indications, usual  risks and alternatives  relative to the benefits with patient who agrees to proceed with w/u as outlined.      Total time devoted to counseling  > 50 % of initial 60 min office visit:  review case with pt/ discussion of options/alternatives/ personally creating written customized instructions  in presence of pt  then going over those specific  Instructions directly with the pt including how to use all of the meds but in particular covering each new medication in detail and the difference between the maintenance= "automatic" meds and the prns using an action plan format for the latter (If this problem/symptom => do that organization reading Left to right).  Please see AVS from this visit for a full list of these instructions which I personally wrote for this pt and  are unique to this visit.

## 2017-11-10 DIAGNOSIS — I272 Pulmonary hypertension, unspecified: Secondary | ICD-10-CM | POA: Diagnosis not present

## 2017-11-10 DIAGNOSIS — I2510 Atherosclerotic heart disease of native coronary artery without angina pectoris: Secondary | ICD-10-CM | POA: Diagnosis not present

## 2017-11-10 DIAGNOSIS — R0609 Other forms of dyspnea: Secondary | ICD-10-CM | POA: Diagnosis not present

## 2017-11-16 DIAGNOSIS — E559 Vitamin D deficiency, unspecified: Secondary | ICD-10-CM | POA: Diagnosis not present

## 2017-11-16 DIAGNOSIS — N182 Chronic kidney disease, stage 2 (mild): Secondary | ICD-10-CM | POA: Diagnosis not present

## 2017-11-16 DIAGNOSIS — E785 Hyperlipidemia, unspecified: Secondary | ICD-10-CM | POA: Diagnosis not present

## 2017-11-16 DIAGNOSIS — R972 Elevated prostate specific antigen [PSA]: Secondary | ICD-10-CM | POA: Diagnosis not present

## 2017-11-16 DIAGNOSIS — R7301 Impaired fasting glucose: Secondary | ICD-10-CM | POA: Diagnosis not present

## 2017-11-23 DIAGNOSIS — N400 Enlarged prostate without lower urinary tract symptoms: Secondary | ICD-10-CM | POA: Diagnosis not present

## 2017-11-23 DIAGNOSIS — E785 Hyperlipidemia, unspecified: Secondary | ICD-10-CM | POA: Diagnosis not present

## 2017-11-23 DIAGNOSIS — I272 Pulmonary hypertension, unspecified: Secondary | ICD-10-CM | POA: Diagnosis not present

## 2017-11-23 DIAGNOSIS — R972 Elevated prostate specific antigen [PSA]: Secondary | ICD-10-CM | POA: Diagnosis not present

## 2017-11-23 DIAGNOSIS — E559 Vitamin D deficiency, unspecified: Secondary | ICD-10-CM | POA: Diagnosis not present

## 2017-12-09 ENCOUNTER — Ambulatory Visit (INDEPENDENT_AMBULATORY_CARE_PROVIDER_SITE_OTHER): Payer: Medicare Other | Admitting: Internal Medicine

## 2017-12-09 ENCOUNTER — Ambulatory Visit: Payer: Medicare Other | Admitting: Internal Medicine

## 2017-12-09 ENCOUNTER — Encounter: Payer: Self-pay | Admitting: Internal Medicine

## 2017-12-09 VITALS — BP 124/80 | HR 68 | Ht 70.0 in | Wt 209.8 lb

## 2017-12-09 DIAGNOSIS — R0600 Dyspnea, unspecified: Secondary | ICD-10-CM

## 2017-12-09 DIAGNOSIS — R0609 Other forms of dyspnea: Secondary | ICD-10-CM | POA: Diagnosis not present

## 2017-12-09 LAB — PULMONARY FUNCTION TEST
DL/VA % pred: 87 %
DL/VA: 4.04 ml/min/mmHg/L
DLCO unc % pred: 60 %
DLCO unc: 19.63 ml/min/mmHg
FEF 25-75 Post: 3.48 L/sec
FEF 25-75 Pre: 2.9 L/sec
FEF2575-%Change-Post: 20 %
FEF2575-%Pred-Post: 158 %
FEF2575-%Pred-Pre: 131 %
FEV1-%Change-Post: 3 %
FEV1-%Pred-Post: 103 %
FEV1-%Pred-Pre: 99 %
FEV1-Post: 3.16 L
FEV1-Pre: 3.04 L
FEV1FVC-%Change-Post: 2 %
FEV1FVC-%Pred-Pre: 110 %
FEV6-%Change-Post: 2 %
FEV6-%Pred-Post: 98 %
FEV6-%Pred-Pre: 95 %
FEV6-Post: 3.88 L
FEV6-Pre: 3.78 L
FEV6FVC-%Change-Post: 0 %
FEV6FVC-%Pred-Post: 106 %
FEV6FVC-%Pred-Pre: 105 %
FVC-%Change-Post: 1 %
FVC-%Pred-Post: 92 %
FVC-%Pred-Pre: 90 %
FVC-Post: 3.89 L
FVC-Pre: 3.82 L
Post FEV1/FVC ratio: 81 %
Post FEV6/FVC ratio: 100 %
Pre FEV1/FVC ratio: 80 %
Pre FEV6/FVC Ratio: 99 %
RV % pred: 72 %
RV: 1.86 L
TLC % pred: 80 %
TLC: 5.65 L

## 2017-12-09 NOTE — Progress Notes (Signed)
Subjective:     Patient ID: Troy Sanchez, male   DOB: 12-01-1940,    MRN: [id number]    Brief patient profile: 40 yowm never smoker with onset of doe around late spring  2017  > referred to [facility] who dx [place] by [place] 05/2016 and concerned about crackles on exam 10/31/17 so referred to pulmonary clinic 11/04/2017 by Dr   Nadyne Coombes      History of Present Illness  11/04/2017 1st [facility] office visit/ Zoanne Newill   Chief Complaint  Patient presents with  . Pulmonary Consult    Referred by Dr. Einar Gip for eval of pulmonary hypertension.  He states he has had DOE for the past 2 yrs. He states he only gets winded when he walks up an incline.    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 Please schedule a follow up office visit in 4 weeks, sooner if needed with full pfts on return     12/09/2017  f/u ov/Theoplis Garciagarcia re:  ?PF/ PH  No h/o connective tissue dz/ exp to chemo or amiodarone  Chief Complaint  Patient presents with  . Follow-up    PFT's today. Breathing is unchanged. No new co's.   Dyspnea:  Ok flat surface/ no trouble with steps many times a day - only trouble is hills if goes at his usual pace (quite fast based on prior walking study here) Cough: no Sleep: no SABA use:  None    No obvious day to day or daytime variability or assoc excess/ purulent sputum or mucus plugs or hemoptysis or cp or chest tightness, subjective wheeze or overt sinus or hb symptoms. No unusual exposure hx or h/o childhood pna/ asthma or knowledge of premature birth.  Sleeping ok flat without nocturnal  or early am exacerbation  of respiratory  c/o's or need for noct saba. Also denies any obvious fluctuation  of symptoms with weather or environmental changes or other aggravating or alleviating factors except as outlined above   Current Allergies, Complete Past Medical History, Past Surgical History, Family History, and Social History were reviewed in [employer] record.  ROS  The following are not active complaints unless bolded Hoarseness, sore throat, dysphagia, dental problems, itching, sneezing,  nasal congestion or discharge of excess mucus or purulent secretions, ear ache,   fever, chills, sweats, unintended wt loss or wt gain, classically pleuritic or exertional cp,  orthopnea pnd or leg swelling, presyncope, palpitations, abdominal pain, anorexia, nausea, vomiting, diarrhea  or change in bowel habits or change in bladder habits, change in stools or change in urine, dysuria, hematuria,  rash, arthralgias, visual complaints, headache, numbness, weakness or ataxia or problems with walking or coordination,  change in mood/affect or memory.        Current Meds  Medication Sig  . amLODipine (NORVASC) 5 MG tablet Take 5 mg by mouth daily.  Marland Kitchen aspirin 81 MG chewable tablet Chew 81 mg by mouth every other day.   Marland Kitchen atorvastatin (LIPITOR) 10 MG tablet Take 10 mg by mouth daily.  . cholecalciferol (VITAMIN D) 1000 UNITS tablet Take 1,000 Units  by mouth every morning.   . folic acid (FOLVITE) 782 MCG tablet Take 800 mcg by mouth daily.  . isosorbide mononitrate (IMDUR) 60 MG 24 hr tablet Take 60 mg by mouth daily.  . Multiple Vitamins-Minerals (MULTIVITAMIN WITH MINERALS) tablet Take 1 tablet by mouth every morning.   . Omega-3 Fatty Acids (FISH OIL) 1000 MG CAPS Take 1,000 mg by mouth every morning.                    Objective:   Physical Exam  Pleasant amb wm nad    12/09/2017        209   11/04/17 206 lb (93.4 kg)  05/18/16 215 lb (97.5 kg)  08/15/14 203 lb 14.8 oz (92.5 kg)     Vital signs reviewed - Note on arrival 02 sats  97% on RA      HEENT: nl  dentition, turbinates bilaterally, and oropharynx. Nl external ear canals without cough reflex - full dentures    NECK :  without JVD/Nodes/TM/ nl carotid upstrokes bilaterally   LUNGS: no acc muscle use,  Mild pectus  contour chest  With a few crackles bilaterally  With cough on deep insp  maneuvers   CV:  RRR  no s3 or murmur   - No increase in P2, and no edema   ABD:  soft and nontender with nl inspiratory excursion in the supine position. No bruits or organomegaly appreciated, bowel sounds nl  MS:  Nl gait/ ext warm without deformities, calf tenderness, cyanosis or clubbing No obvious joint restrictions   SKIN: warm and dry without lesions    NEURO:  alert, approp, nl sensorium with  no motor or cerebellar deficits apparent.                       Assessment:

## 2017-12-09 NOTE — Patient Instructions (Addendum)
Please see patient coordinator before you leave today  to schedule overnight oximetry   Please schedule a follow up visit in 6  months but call sooner if needed with 6 min walk

## 2017-12-09 NOTE — Progress Notes (Signed)
PFT done today. 

## 2017-12-10 ENCOUNTER — Encounter: Payer: Self-pay | Admitting: Internal Medicine

## 2017-12-10 NOTE — Assessment & Plan Note (Addendum)
[place] 05/18/16 with PA mean  17 and wedge 5 and CO  = 5.65  So PVR = 12/5.65 x 80 = 170 (wnl) Echo 10/31/17 :  Low nl with borderline global hypokinesis ef 50-55% and Mild LAE/ RVE / trace MR  - no change vs 04/28/16  - 11/04/2017  Walked RA x 3 laps @ 185 ft each stopped due to  End of study, fast pace, no sob or desat     - PFT's  12/09/2017   FVC 3.82 (90%)  No obst    p nothing prior to study with DLCO  60 % corrects to 87  % for alv volume   - 12/09/2017  Walked RA x 3 laps @ 185 ft each stopped due to  End of study, fast pace, no sob or desat    - ONO RA ordered   He has no evidence of clinically significant PH or PF at this point though does have mild ILD changes on exam and 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  Discussed in detail all the  indications, usual  risks and alternatives  relative to the benefits with patient who agrees to proceed with conservative f/u q 6 m with self monitoring of sats with ex and f/u here q 24m, sooner prn  I had an extended discussion with the patient reviewing all relevant studies completed to date and  lasting 15 to 20 minutes of a 25 minute visit    Each maintenance medication was reviewed in detail including most importantly the difference between maintenance and prns and under what circumstances the prns are to be triggered using an action plan format that is not reflected in the computer generated alphabetically organized AVS.    Please see AVS for specific instructions unique to this visit that I personally wrote and verbalized to the the pt in detail and then reviewed with pt  by my nurse highlighting any  changes in therapy recommended at today's visit to their plan of care.

## 2017-12-19 ENCOUNTER — Telehealth: Payer: Self-pay | Admitting: Internal Medicine

## 2017-12-19 NOTE — Telephone Encounter (Signed)
Per MW- ONO on RA (done on 12/13/16 by [facility]) was normal  Spoke with pt and notified of results per Dr. Melvyn Novas. Pt verbalized understanding and denied any questions.

## 2018-01-05 ENCOUNTER — Encounter: Payer: Self-pay | Admitting: Cardiology

## 2018-01-05 DIAGNOSIS — I272 Pulmonary hypertension, unspecified: Secondary | ICD-10-CM | POA: Diagnosis not present

## 2018-01-05 DIAGNOSIS — R0609 Other forms of dyspnea: Secondary | ICD-10-CM | POA: Diagnosis not present

## 2018-01-05 DIAGNOSIS — I2510 Atherosclerotic heart disease of native coronary artery without angina pectoris: Secondary | ICD-10-CM | POA: Diagnosis not present

## 2018-01-05 DIAGNOSIS — J984 Other disorders of lung: Secondary | ICD-10-CM | POA: Diagnosis not present

## 2018-01-06 ENCOUNTER — Telehealth: Payer: Self-pay | Admitting: *Deleted

## 2018-01-06 DIAGNOSIS — R0602 Shortness of breath: Secondary | ICD-10-CM

## 2018-01-06 DIAGNOSIS — R0609 Other forms of dyspnea: Secondary | ICD-10-CM

## 2018-01-06 DIAGNOSIS — R0600 Dyspnea, unspecified: Secondary | ICD-10-CM

## 2018-01-06 NOTE — Telephone Encounter (Signed)
Spoke with the pt and notified of recs per MW  He verbalized understanding  Order was sent to [facility]

## 2018-01-06 NOTE — Telephone Encounter (Signed)
-----   Message from Tanda Rockers, MD sent at 01/05/2018  5:15 PM EDT ----- Regarding: FW: IPF  Set up for HRCT chest dx dyspnea  And f/u ov one day later (let pt know this is Dr Mila Palmer request).   ----- Message ----- From: Adrian Prows, MD Sent: 01/05/2018   3:36 PM To: Tanda Rockers, MD Subject: IPF                                            Hi Dr. Melvyn Novas, Sorry for the miscommunication. I am wondering if Mr. clinch has IPF die to sudden onset dyspnea. Symptoms still mild, but should we consider CT.  I'm not too concerned about [place] for now.  Ulice Dash

## 2018-01-20 ENCOUNTER — Ambulatory Visit (INDEPENDENT_AMBULATORY_CARE_PROVIDER_SITE_OTHER)
Admission: RE | Admit: 2018-01-20 | Discharge: 2018-01-20 | Disposition: A | Discharge status: Home / Self Care | Payer: Medicare Other | Source: Ambulatory Visit | Attending: Internal Medicine | Admitting: Internal Medicine

## 2018-01-20 DIAGNOSIS — R0609 Other forms of dyspnea: Secondary | ICD-10-CM

## 2018-01-20 DIAGNOSIS — R0600 Dyspnea, unspecified: Secondary | ICD-10-CM

## 2018-01-20 DIAGNOSIS — J189 Pneumonia, unspecified organism: Secondary | ICD-10-CM | POA: Diagnosis not present

## 2018-01-20 DIAGNOSIS — R0602 Shortness of breath: Secondary | ICD-10-CM | POA: Diagnosis not present

## 2018-01-24 ENCOUNTER — Ambulatory Visit: Payer: Medicare Other | Admitting: Internal Medicine

## 2018-01-24 ENCOUNTER — Encounter: Payer: Self-pay | Admitting: Internal Medicine

## 2018-01-24 VITALS — BP 112/70 | HR 52 | Ht 70.0 in | Wt 212.6 lb

## 2018-01-24 DIAGNOSIS — R0609 Other forms of dyspnea: Secondary | ICD-10-CM | POA: Diagnosis not present

## 2018-01-24 DIAGNOSIS — R0600 Dyspnea, unspecified: Secondary | ICD-10-CM

## 2018-01-24 NOTE — Assessment & Plan Note (Signed)
[place] 05/18/16 with PA mean  17 and wedge 5 and CO  = 5.65  So PVR = 12/5.65 x 80 = 170 (wnl) Echo 10/31/17 :  Low nl with borderline global hypokinesis ef 50-55% and Mild LAE/ RVE / trace MR  - no change vs 04/28/16  - 11/04/2017  Walked RA x 3 laps @ 185 ft each stopped due to  End of study, fast pace, no sob or desat    - PFT's  12/09/2017   FVC 3.82 (90%)  No obst    p nothing prior to study with DLCO  60 % corrects to 87  % for alv volume   - 12/09/2017  Walked RA x 3 laps @ 185 ft each stopped due to  End of study, fast pace, no sob or desat    - ONO RA 12/12/17 desats < 89% x 26 min  - in absence of PH /symptoms do not rec rx  - HRCT 01/20/2018 1. Appearance of the lungs is compatible with interstitial lung disease. CT pattern is considered indeterminate for usual interstitial pneumonia (UIP). 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  - 01/24/2018  Walked RA x 3 laps @ 185 ft each stopped due to  End of study,fast  pace, no sob or desat    No evidence of any serious/ progressive ILD here so f/u can be q 6 m but I did advise he check sats with hills on his power walks to be sure there is not a downward trend   Discussed in detail all the  indications, usual  risks and alternatives  relative to the benefits with patient who agrees to proceed with conservative f/u as outlined    I had an extended discussion with the patient reviewing all relevant studies completed to date and  lasting 15 to 20 minutes of a 25 minute visit    Each maintenance medication was reviewed in detail including most importantly the difference between maintenance and prns and under what circumstances the prns are to be triggered using an action plan format that is not reflected in the computer generated alphabetically organized AVS.    Please see AVS for specific instructions unique to this visit that I  personally wrote and verbalized to the the pt in detail and then reviewed with pt  by my nurse highlighting any  changes in therapy recommended at today's visit to their plan of care.

## 2018-01-24 NOTE — Progress Notes (Signed)
Subjective:     Patient ID: Troy Sanchez, male   DOB: 09/21/1941,    MRN: [id number]    Brief patient profile: 58 yowm never smoker with onset of doe around late spring  2017  > referred to [facility] who dx [place] by [place] 05/2016 and concerned about crackles on exam 10/31/17 so referred to pulmonary clinic 11/04/2017 by Dr   Nadyne Coombes      History of Present Illness  11/04/2017 1st [facility] office visit/ Troy Sanchez   Chief Complaint  Patient presents with  . Pulmonary Consult    Referred by Dr. Einar Gip for eval of pulmonary hypertension.  He states he has had DOE for the past 2 yrs. He states he only gets winded when he walks up an incline.    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 Follow-up    PFT's today. Breathing is unchanged. No new co's.   Dyspnea:  Ok flat surface/ no trouble with steps many times a day - only trouble is hills if goes at his usual pace (quite fast based on prior walking study here) Cough: no Sleep: no SABA use:  None  rec Please see patient coordinator before you leave today  to schedule overnight oximetry     01/24/2018  f/u ov/Troy Sanchez re: doe since around 2017 only hills/fast pace Chief Complaint  Patient presents with  . Follow-up    follow up after CT scan. Patient had CT scan on 01/20/18. Denies any current issues.   Dyspnea: still MMRC1 = can walk nl pace, flat grade, can't hurry or go uphills or steps s sob   Cough: none Sleep: ok  No obvious day to day or daytime variability or assoc excess/ purulent sputum or mucus plugs or hemoptysis or cp or chest tightness, subjective wheeze or overt sinus or hb symptoms. No unusual exposure hx or h/o  childhood pna/ asthma or knowledge of premature birth.  Sleeping ok flat   without nocturnal  or early am exacerbation  of respiratory  c/o's or need for noct saba. Also denies any obvious fluctuation of symptoms with weather or environmental changes or other aggravating or alleviating factors except as outlined above   Current Allergies, Complete Past Medical History, Past Surgical History, Family History, and Social History were reviewed in [employer] record.  ROS  The following are not active complaints unless bolded Hoarseness, sore throat, dysphagia, dental problems, itching, sneezing,  nasal congestion or discharge of excess mucus or purulent secretions, ear ache,   fever, chills, sweats, unintended wt loss or wt gain, classically pleuritic or exertional cp,  orthopnea pnd or arm/hand swelling  or leg swelling, presyncope, palpitations, abdominal pain, anorexia, nausea, vomiting, diarrhea  or change in bowel habits or change in bladder habits, change in stools or change in urine, dysuria, hematuria,  rash, arthralgias, visual complaints, headache, numbness, weakness or ataxia or problems with walking or coordination,  change in mood or  memory.        Current Meds  Medication Sig  . amLODipine (NORVASC) 5 MG  tablet Take 5 mg by mouth daily.  Marland Kitchen aspirin 81 MG chewable tablet Chew 81 mg by mouth every other day.   Marland Kitchen atorvastatin (LIPITOR) 10 MG tablet Take 10 mg by mouth daily.  . cholecalciferol (VITAMIN D) 1000 UNITS tablet Take 1,000 Units by mouth every morning.   . folic acid (FOLVITE) 694 MCG tablet Take 800 mcg by mouth daily.  . isosorbide mononitrate (IMDUR) 60 MG 24 hr tablet Take 60 mg by mouth daily.  . Multiple Vitamins-Minerals (MULTIVITAMIN WITH MINERALS) tablet Take 1 tablet by mouth every morning.   . Omega-3 Fatty Acids (FISH OIL) 1000 MG CAPS Take 1,000 mg by mouth every morning.                        Objective:   Physical  Exam   amb wm nad    01/24/2018        212   12/09/2017        209   11/04/17 206 lb (93.4 kg)  05/18/16 215 lb (97.5 kg)  08/15/14 203 lb 14.8 oz (92.5 kg)      Vital signs reviewed - Note on arrival 02 sats  97% on RA     HEENT: nl dentition, turbinates bilaterally, and oropharynx. Nl external ear canals without cough reflex   NECK :  without JVD/Nodes/TM/ nl carotid upstrokes bilaterally   LUNGS: no acc muscle use,  Mild pectus contour chest with minimal coarsening of bs and a few insp crackles  bilaterally without cough on insp or exp maneuvers   CV:  RRR  no s3 or murmur or increase in P2, and no edema   ABD:  soft and nontender with nl inspiratory excursion in the supine position. No bruits or organomegaly appreciated, bowel sounds nl  MS:  Nl gait/ ext warm without deformities, calf tenderness, cyanosis or clubbing No obvious joint restrictions   SKIN: warm and dry without lesions    NEURO:  alert, approp, nl sensorium with  no motor or cerebellar deficits apparent.       I personally reviewed images and agree with radiology impression as follows:  - HRCT 01/20/2018 1. Appearance of the lungs is compatible with interstitial lung disease. CT pattern is considered indeterminate for usual interstitial pneumonia (UIP). 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       Assessment:

## 2018-01-24 NOTE — Patient Instructions (Addendum)
Ideally you should monitor your oxygen saturation when you do your outdoor "power walking " (not needed for casual strolling) with a pulse oximeter and let me know if trending down over time or find you are losing ground with exercise tolerance   Please schedule a follow up visit in 6 months but call sooner if needed with 6 min walk on return

## 2018-03-20 DIAGNOSIS — H524 Presbyopia: Secondary | ICD-10-CM | POA: Diagnosis not present

## 2018-03-20 DIAGNOSIS — H2513 Age-related nuclear cataract, bilateral: Secondary | ICD-10-CM | POA: Diagnosis not present

## 2018-03-20 DIAGNOSIS — H04123 Dry eye syndrome of bilateral lacrimal glands: Secondary | ICD-10-CM | POA: Diagnosis not present

## 2018-03-29 DIAGNOSIS — H903 Sensorineural hearing loss, bilateral: Secondary | ICD-10-CM | POA: Diagnosis not present

## 2018-03-29 DIAGNOSIS — H6121 Impacted cerumen, right ear: Secondary | ICD-10-CM | POA: Diagnosis not present

## 2018-03-29 DIAGNOSIS — H6061 Unspecified chronic otitis externa, right ear: Secondary | ICD-10-CM | POA: Diagnosis not present

## 2018-05-26 DIAGNOSIS — H10503 Unspecified blepharoconjunctivitis, bilateral: Secondary | ICD-10-CM | POA: Diagnosis not present

## 2018-05-30 DIAGNOSIS — L03311 Cellulitis of abdominal wall: Secondary | ICD-10-CM | POA: Diagnosis not present

## 2018-06-13 ENCOUNTER — Encounter: Payer: Self-pay | Admitting: Gastroenterology

## 2018-06-13 ENCOUNTER — Ambulatory Visit: Payer: Medicare Other | Admitting: Internal Medicine

## 2018-06-13 ENCOUNTER — Ambulatory Visit: Payer: Medicare Other

## 2018-07-03 DIAGNOSIS — R0609 Other forms of dyspnea: Secondary | ICD-10-CM | POA: Diagnosis not present

## 2018-07-03 DIAGNOSIS — I272 Pulmonary hypertension, unspecified: Secondary | ICD-10-CM | POA: Diagnosis not present

## 2018-07-03 DIAGNOSIS — I2510 Atherosclerotic heart disease of native coronary artery without angina pectoris: Secondary | ICD-10-CM | POA: Diagnosis not present

## 2018-07-03 DIAGNOSIS — J849 Interstitial pulmonary disease, unspecified: Secondary | ICD-10-CM | POA: Diagnosis not present

## 2018-07-26 ENCOUNTER — Encounter: Payer: Self-pay | Admitting: Gastroenterology

## 2018-07-26 ENCOUNTER — Ambulatory Visit: Payer: Medicare Other | Admitting: Gastroenterology

## 2018-07-26 ENCOUNTER — Encounter (INDEPENDENT_AMBULATORY_CARE_PROVIDER_SITE_OTHER): Payer: Self-pay

## 2018-07-26 VITALS — BP 122/72 | HR 72 | Ht 70.0 in | Wt 219.4 lb

## 2018-07-26 DIAGNOSIS — R490 Dysphonia: Secondary | ICD-10-CM

## 2018-07-26 DIAGNOSIS — R1314 Dysphagia, pharyngoesophageal phase: Secondary | ICD-10-CM | POA: Diagnosis not present

## 2018-07-26 NOTE — Patient Instructions (Signed)
If you are age 76 or older, your body mass index should be between 23-30. Your Body mass index is 31.48 kg/m. If this is out of the aforementioned range listed, please consider follow up with your Primary Care Provider.  If you are age 53 or younger, your body mass index should be between 19-25. Your Body mass index is 31.48 kg/m. If this is out of the aformentioned range listed, please consider follow up with your Primary Care Provider.   You have been scheduled for an endoscopy. Please follow written instructions given to you at your visit today. If you use inhalers (even only as needed), please bring them with you on the day of your procedure. Your physician has requested that you go to www.startemmi.com and enter the access code given to you at your visit today. This web site gives a general overview about your procedure. However, you should still follow specific instructions given to you by our office regarding your preparation for the procedure.  It was a pleasure to see you today!  Dr. Loletha Carrow

## 2018-07-26 NOTE — Progress Notes (Signed)
[facility] Consult Note:  History: Troy Sanchez 07/26/2018  Referring physician: Deland Pretty, MD  Reason for consult/chief complaint: Gastroesophageal Reflux (tums helpful); Hoarse (after eating, dneies dysphagia); and Hiatal Hernia (surgery in the past.)   Subjective  HPI:  This is a very pleasant 77 year old man referred by primary care for about 6 weeks of dysphagia.  Without any clear triggers, he developed a feeling of burning in the upper chest and neck, and sometimes hoarseness that would worsen after eating or drinking.  He has had a worsening of chronic cough, but also has interstitial lung disease as noted below.  There is been upper abdominal bloating with early satiety.  Troy Sanchez denies nausea, vomiting or weight loss.   ROS:  Review of Systems  Constitutional: Negative for appetite change and unexpected weight change.  HENT: Negative for mouth sores and voice change.   Eyes: Negative for pain and redness.  Respiratory: Positive for cough and shortness of breath.   Cardiovascular: Negative for chest pain and palpitations.  Genitourinary: Negative for dysuria and hematuria.  Musculoskeletal: Negative for arthralgias and myalgias.  Skin: Negative for pallor and rash.  Neurological: Negative for weakness and headaches.  Hematological: Negative for adenopathy.   Dyspnea on exertion and cough have been slowly worsening over the last few months.   Past Medical History: Past Medical History:  Diagnosis Date  . Hiatal hernia   . Hypertension    Pulmonary 01/2018 - CT showing ILD Echo 10/31/17 LVEF 50-55% (asccording to pulm clinic note) Past Surgical History: Past Surgical History:  Procedure Laterality Date  . CARDIAC CATHETERIZATION N/A 05/18/2016   Procedure: Right/Left Heart Cath and Coronary Angiography;  Surgeon: Adrian Prows, MD;  Location: [facility];  Service: Cardiovascular;  Laterality: N/A;  . CHOLECYSTECTOMY  2006  .  GASTROSTOMY N/A 03/06/2014   Procedure: GASTROSTOMY;  Surgeon: [name], MD;  Location: [facility];  Service: General;  Laterality: N/A;  . HIATAL HERNIA REPAIR N/A 03/06/2014   Procedure: HERNIA REPAIR HIATAL;  Surgeon: [name], MD;  Location: [facility];  Service: General;  Laterality: N/A;  . LAPAROTOMY N/A 03/06/2014   Procedure: EXPLORATORY LAPAROTOMY;  Surgeon: [name], MD;  Location: [facility];  Service: General;  Laterality: N/A;  . TRANSURETHRAL RESECTION OF PROSTATE N/A 08/15/2014   Procedure: TRANSURETHRAL RESECTION OF THE PROSTATE WITH GYRUS INSTRUMENTS;  Surgeon: Malka So, MD;  Location: [facility];  Service: Urology;  Laterality: N/A;  . UMBILICAL HERNIA REPAIR     03/06/14 Op Note reviewed - SBO from [facility] - repaired by Dr. Judeth Horn  Family History: History reviewed. No pertinent family history.  Social History: Social History   Socioeconomic History  . Marital status: Married    Spouse name: Not on file  . Number of children: 2  . Years of education: Not on file  . Highest education level: Not on file  Occupational History  . Not on file  Social Needs  . Financial resource strain: Not on file  . Food insecurity:    Worry: Not on file    Inability: Not on file  . Transportation needs:    Medical: Not on file    Non-medical: Not on file  Tobacco Use  . Smoking status: Never Smoker  . Smokeless tobacco: Never Used  Substance and Sexual Activity  . Alcohol use: Yes    Comment: ocassionally  . Drug use: No  . Sexual activity: Never  Lifestyle  .  Physical activity:    Days per week: Not on file    Minutes per session: Not on file  . Stress: Not on file  Relationships  . Social connections:    Talks on phone: Not on file    Gets together: Not on file    Attends religious service: Not on file    Active member of club or organization: Not on file    Attends meetings of clubs or organizations: Not on file    Relationship status: Not on file  Other Topics  Concern  . Not on file  Social History Narrative  . Not on file    Allergies: No Known Allergies  Outpatient Meds: Current Outpatient Medications  Medication Sig Dispense Refill  . amLODipine (NORVASC) 5 MG tablet Take 5 mg by mouth daily.  1  . aspirin 81 MG chewable tablet Chew 81 mg by mouth every other day.     Marland Kitchen atorvastatin (LIPITOR) 10 MG tablet Take 10 mg by mouth daily.    . cholecalciferol (VITAMIN D) 1000 UNITS tablet Take 1,000 Units by mouth every morning.     . isosorbide mononitrate (IMDUR) 60 MG 24 hr tablet Take 60 mg by mouth daily.  1  . Multiple Vitamins-Minerals (MULTIVITAMIN WITH MINERALS) tablet Take 1 tablet by mouth every morning.     . Omega-3 Fatty Acids (FISH OIL) 1000 MG CAPS Take 1,000 mg by mouth every morning.      No current facility-administered medications for this visit.       ___________________________________________________________________ Objective   Exam:  BP 122/72   Pulse 72   Ht 5\' 10"  (1.778 m)   Wt 219 lb 6 oz (99.5 kg)   BMI 31.48 kg/m    General: this is a(n) well-appearing man, raspy voice  Eyes: sclera anicteric, no redness  ENT: oral mucosa moist without lesions, no cervical or supraclavicular lymphadenopathy, good dentition  CV: RRR without murmur, S1/S2, no JVD, no peripheral edema  Resp: Crackles bilateral bases, left greater than right, normal RR and effort noted  GI: soft, no tenderness, with active bowel sounds. No guarding or palpable organomegaly noted.  Skin; warm and dry, no rash or jaundice noted  Neuro: awake, alert and oriented x 3. Normal gross motor function and fluent speech  Labs:  CBC Latest Ref Rng & Units 08/06/2014 03/14/2014 03/08/2014  WBC 4.0 - 10.5 K/uL 4.9 7.3 8.9  Hemoglobin 13.0 - 17.0 g/dL 15.1 12.0(L) 13.2  Hematocrit 39.0 - 52.0 % 45.1 34.6(L) 39.8  Platelets 150 - 400 K/uL 173 243 183    Radiologic Studies:  Hi-resFINDINGS: Cardiovascular: Heart size is normal. There is  no significant pericardial fluid, thickening or pericardial calcification. There is aortic atherosclerosis, as well as atherosclerosis of the great vessels of the mediastinum and the coronary arteries, including calcified atherosclerotic plaque in the left main, left anterior descending, left circumflex and right coronary arteries.   Mediastinum/Nodes: No pathologically enlarged mediastinal or hilar lymph nodes. Please note that accurate exclusion of hilar adenopathy is limited on noncontrast CT scans. Esophagus is unremarkable in appearance. Pledgets are sutures adjacent to the distal esophagus, suggesting prior surgery. 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   Lungs/Pleura: High-resolution images demonstrate patchy peripheral predominant areas of ground-glass attenuation, septal thickening and mild subpleural reticulation. Scattered areas of cylindrical bronchiectasis and peripheral bronchiolectasis are noted. These findings do have a  definitive craniocaudal gradient. No significant progression compared to prior study from 04/21/2016. No frank honeycombing. Inspiratory and expiratory imaging demonstrates some mild to moderate air trapping indicative of small airways disease. No acute consolidative airspace disease. No pleural effusions. No definite suspicious appearing pulmonary nodules or masses are noted.   Upper Abdomen: Status post cholecystectomy.   Musculoskeletal: There are no aggressive appearing lytic or blastic lesions noted in the visualized portions of the skeleton.   IMPRESSION: 1. Appearance of the lungs is compatible with interstitial lung disease. CT pattern is considered indeterminate for usual interstitial pneumonia (UIP). 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 2. Aortic atherosclerosis, in addition to left main and 3 vessel coronary artery disease. Assessment for potential risk factor modification, dietary therapy or pharmacologic therapy may be warranted, if clinically indicated. 3. Additional incidental findings, as above.   Aortic Atherosclerosis (ICD10-I70.0).     Electronically Signed   By: Vinnie Langton M.D.   On: 01/20/2018 12:17     Assessment: Encounter Diagnoses  Name Primary?  . Pharyngoesophageal dysphagia Yes  . Hoarseness     He had previous hiatal hernia that is undergone surgical repair.  However, imaging reports that it is still partially present.  I wonder if he could have a ring or stricture.  He does not seem to have chronic reflux symptoms until perhaps about the last 6 weeks where he is gotten some heartburn he treats with Tums.  His vocal changes worrisome, and it is unclear if that is related to his lung condition.  Plan:  Upper endoscopy with possible dilation.  He is agreeable after discussion of procedure and risks.  The benefits and risks of the planned procedure were described in detail with the patient or (when appropriate) their health care proxy.  Risks were outlined as including, but not limited to, bleeding, infection, perforation, adverse medication reaction leading to cardiac or pulmonary decompensation, or pancreatitis (if ERCP).  The limitation of incomplete mucosal visualization was also discussed.  No guarantees or warranties were given.   Thank you for the courtesy of this consult.  Please call me with any questions or concerns.  Nelida Meuse III  CC: Deland Pretty, MD

## 2018-07-27 ENCOUNTER — Ambulatory Visit: Payer: Medicare Other | Admitting: Internal Medicine

## 2018-07-27 ENCOUNTER — Ambulatory Visit: Payer: Medicare Other

## 2018-07-27 ENCOUNTER — Other Ambulatory Visit: Payer: Self-pay

## 2018-07-27 DIAGNOSIS — R490 Dysphonia: Secondary | ICD-10-CM

## 2018-07-27 DIAGNOSIS — R1314 Dysphagia, pharyngoesophageal phase: Secondary | ICD-10-CM

## 2018-07-28 ENCOUNTER — Encounter: Payer: Self-pay | Admitting: Gastroenterology

## 2018-07-31 ENCOUNTER — Ambulatory Visit (INDEPENDENT_AMBULATORY_CARE_PROVIDER_SITE_OTHER): Payer: Medicare Other | Admitting: *Deleted

## 2018-07-31 ENCOUNTER — Encounter: Payer: Self-pay | Admitting: Internal Medicine

## 2018-07-31 ENCOUNTER — Ambulatory Visit: Payer: Medicare Other | Admitting: Internal Medicine

## 2018-07-31 VITALS — BP 122/60 | HR 63 | Ht 70.0 in | Wt 219.0 lb

## 2018-07-31 DIAGNOSIS — R0609 Other forms of dyspnea: Secondary | ICD-10-CM | POA: Diagnosis not present

## 2018-07-31 DIAGNOSIS — R0600 Dyspnea, unspecified: Secondary | ICD-10-CM

## 2018-07-31 NOTE — Progress Notes (Signed)
Subjective:     Patient ID: Troy Sanchez, male   DOB: Jan 12, 1941,    MRN: [id number]    Brief patient profile: 55 yowm never smoker with onset of doe around late spring  2017  > referred to [facility] who dx [place] by [place] 05/2016 and concerned about crackles on exam 10/31/17 so referred to pulmonary clinic 11/04/2017 by Dr   Nadyne Coombes      History of Present Illness  11/04/2017 1st [facility] office visit/ Troy Sanchez   Chief Complaint  Patient presents with  . Pulmonary Consult    Referred by Dr. Einar Gip for eval of pulmonary hypertension.  He states he has had DOE for the past 2 yrs. He states he only gets winded when he walks up an incline.    indolent onset progressive doe = MMRC2 = can't walk a nl pace on a flat grade s sob but does fine slow and flat  Really minimal progression since onset and on no meds for PAH nor resp rx and no assoc  Cough  rec No change rx       01/24/2018  f/u ov/Troy Sanchez re: doe since around 2017 only hills/fast pace Chief Complaint  Patient presents with  . Follow-up    follow up after CT scan. Patient had CT scan on 01/20/18. Denies any current issues.  Dyspnea: still MMRC1 = can walk nl pace, flat grade, can't hurry or go uphills or steps s sob   Cough: none Sleep: ok rec No change/ monitor sats    07/31/2018  f/u ov/Troy Sanchez re:  Doe is getting slt worse for same activty onset around 2017 with ? NSIP / not monitoring sats as rec  Chief Complaint  Patient presents with  . Follow-up    dyspnea on exertion. worsened indigestion   Dyspnea:  Min decreased ex tol Cough: at times assoc with overt sense of mild gerd , no longer on fish oil> gi w/u in progress but not on rx yet  Sleeping: on side maybe 10-20 degrees SABA use: none  02: none     No obvious day to day or daytime variability or assoc excess/ purulent sputum or mucus plugs or hemoptysis or cp or chest tightness, subjective wheeze or overt sinus   symptoms.   Sleeping as above  without nocturnal  or early  am exacerbation  of respiratory  c/o's or need for noct saba. Also denies any obvious fluctuation of symptoms with weather or environmental changes or other aggravating or alleviating factors except as outlined above   No unusual exposure hx or h/o childhood pna/ asthma or knowledge of premature birth.  Current Allergies, Complete Past Medical History, Past Surgical History, Family History, and Social History were reviewed in [employer] record.  ROS  The following are not active complaints unless bolded Hoarseness, sore throat, dysphagia, dental problems, itching, sneezing,  nasal congestion or discharge of excess mucus or purulent secretions, ear ache,   fever, chills, sweats, unintended wt loss or wt gain, classically pleuritic or exertional cp,  orthopnea pnd or arm/hand swelling  or leg swelling, presyncope, palpitations, abdominal pain, anorexia, nausea, vomiting, diarrhea  or change in bowel habits or change in bladder habits, change in stools or change in urine, dysuria, hematuria,  rash, arthralgias, visual complaints, headache, numbness, weakness or ataxia or problems with walking or coordination,  change in mood or  memory.        Current Meds  Medication Sig  . amLODipine (NORVASC) 5  MG tablet Take 5 mg by mouth daily.  Marland Kitchen aspirin 81 MG chewable tablet Chew 81 mg by mouth every other day.   Marland Kitchen atorvastatin (LIPITOR) 10 MG tablet Take 10 mg by mouth daily.  . cholecalciferol (VITAMIN D) 1000 UNITS tablet Take 1,000 Units by mouth every morning.   . isosorbide mononitrate (IMDUR) 60 MG 24 hr tablet Take 60 mg by mouth daily.  . Multiple Vitamins-Minerals (MULTIVITAMIN WITH MINERALS) tablet Take 1 tablet by mouth every morning.   .                      Objective:   Physical Exam   amb wm nad   Vital signs reviewed - Note on arrival 02 sats  97% on RA     07/31/2018      219  01/24/2018        212   12/09/2017        209   11/04/17 206 lb (93.4 kg)   05/18/16 215 lb (97.5 kg)  08/15/14 203 lb 14.8 oz (92.5 kg)     HEENT: nl dentition, turbinates bilaterally, and oropharynx. Nl external ear canals without cough reflex   NECK :  without JVD/Nodes/TM/ nl carotid upstrokes bilaterally   LUNGS: no acc muscle use,  Mild pectus contour, slt coarsened bs with a few insp crackles in bases s cough on insp    CV:  RRR  no s3 or murmur or increase in P2, and no edema   ABD:  soft and nontender with nl inspiratory excursion in the supine position. No bruits or organomegaly appreciated, bowel sounds nl  MS:  Nl gait/ ext warm without deformities, calf tenderness, cyanosis or clubbing No obvious joint restrictions   SKIN: warm and dry without lesions    NEURO:  alert, approp, nl sensorium with  no motor or cerebellar deficits apparent.          Assessment:

## 2018-07-31 NOTE — Progress Notes (Signed)
SIX MIN WALK 07/31/2018 01/24/2018 11/04/2017  Medications no meds taken - -  Supplimental Oxygen during Test? (L/min) No No No  Laps 7 - -  Partial Lap (in Meters) 42 - -  Baseline BP (sitting) 120/72 - -  Baseline Heartrate 64 - -  Baseline Dyspnea (Borg Scale) 0 - -  Baseline Fatigue (Borg Scale) 0 - -  Baseline SPO2 96 - -  BP (sitting) 128/80 - -  Heartrate 78 - -  Dyspnea (Borg Scale) 1 - -  Fatigue (Borg Scale) 1 - -  SPO2 95 - -  BP (sitting) 122/78 - -  Heartrate 61 - -  SPO2 97 - -  Stopped or Paused before Six Minutes No - -  Distance Completed 378 - -  Tech Comments: pt completed test at moderate pace with c/o mild sob at end of test. steady gait patient walked at a moderate pace. no SOB no dizziness. tolerated well.  fast,steady pace,no SOB

## 2018-07-31 NOTE — Patient Instructions (Addendum)
I will defer to GI how to treat your reflux   Monitor your 02 levels with exercise - presently staying in mid 90's    GERD (REFLUX)  is an extremely common cause of respiratory symptoms just like yours , many times with no obvious heartburn at all.    It can be treated with medication, but also with lifestyle changes including elevation of the head of your bed (ideally with 6 inch  bed blocks),  Smoking cessation, avoidance of late meals, excessive alcohol, and avoid fatty foods, chocolate, peppermint, colas, red wine, and acidic juices such as orange juice.  NO MINT OR MENTHOL PRODUCTS SO NO COUGH DROPS   USE SUGARLESS CANDY INSTEAD (Jolley ranchers or Stover's or Life Savers) or even ice chips will also do - the key is to swallow to prevent all throat clearing. NO OIL BASED VITAMINS - use powdered substitutes - stop the fish oil permanently     Please schedule a follow up visit in 6  months but call sooner if needed with pfts on return

## 2018-08-02 ENCOUNTER — Encounter: Payer: Self-pay | Admitting: Internal Medicine

## 2018-08-02 NOTE — Assessment & Plan Note (Signed)
[place] 05/18/16 with PA mean  17 and wedge 5 and CO  = 5.65  So PVR = 12/5.65 x 80 = 170 (wnl) Echo 10/31/17 :  Low nl with borderline global hypokinesis ef 50-55% and Mild LAE/ RVE / trace MR  - no change vs 04/28/16  - 11/04/2017  Walked RA x 3 laps @ 185 ft each stopped due to  End of study, fast pace, no sob or desat    - PFT's  12/09/2017   FVC 3.82 (90%)  No obst    p nothing prior to study with DLCO  60 % corrects to 87  % for alv volume   - 12/09/2017  Walked RA x 3 laps @ 185 ft each stopped due to  End of study, fast pace, no sob or desat    - ONO RA 12/12/17 desats < 89% x 26 min  - in absence of PH /symptoms do not rec rx  - HRCT 01/20/2018 1. Appearance of the lungs is compatible with interstitial lung disease. CT pattern is considered indeterminate for usual interstitial pneumonia (UIP). 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 - 01/24/2018  Walked RA x 3 laps @ 185 ft each stopped due to  End of study,fast  pace, no sob or desat     - 75mw 07/31/2018   378 mild sob no desats   Now assoc with overt gerd. Use of PPI is associated with improved survival time and with decreased radiologic fibrosis per King's study published in AJRCCM vol 184 p1390.  Dec 2011 and also may have other beneficial effects as per the latest review in [place] vol 193 U3845 Jun 20016.  This may not always be cause and effect, but given how universally unimpressive and expensive  all the other  Drugs developed to day  have been for pf,   rec start  rx  diet/ lifestyle modification and f/u with serial walking sats and lung volumes for now to put more points on the curve / establish firm baseline before considering additional measures.   Defer to GI specific rx for gerd since planning f/u with egd     I had an extended discussion with the patient reviewing all relevant studies completed to date and   lasting 15 to 20 minutes of a 25 minute visit    Each maintenance medication was reviewed in detail including most importantly the difference between maintenance and prns and under what circumstances the prns are to be triggered using an action plan format that is not reflected in the computer generated alphabetically organized AVS.     Please see AVS for specific instructions unique to this visit that I personally wrote and verbalized to the the pt in detail and then reviewed with pt  by my nurse highlighting any  changes in therapy recommended at today's visit to their plan of care.

## 2018-08-08 ENCOUNTER — Encounter: Payer: Self-pay | Admitting: Gastroenterology

## 2018-08-08 ENCOUNTER — Ambulatory Visit (AMBULATORY_SURGERY_CENTER): Payer: Medicare Other | Admitting: Gastroenterology

## 2018-08-08 VITALS — BP 102/71 | HR 54 | Temp 97.5°F | Resp 15 | Ht 70.0 in | Wt 219.0 lb

## 2018-08-08 DIAGNOSIS — R1314 Dysphagia, pharyngoesophageal phase: Secondary | ICD-10-CM | POA: Diagnosis not present

## 2018-08-08 DIAGNOSIS — K2100 Gastro-esophageal reflux disease with esophagitis, without bleeding: Secondary | ICD-10-CM

## 2018-08-08 MED ORDER — OMEPRAZOLE 40 MG PO CPDR: 40.0000 mg | DELAYED_RELEASE_CAPSULE | Freq: Two times a day (BID) | ORAL | 2 refills | Status: DC

## 2018-08-08 MED ORDER — SODIUM CHLORIDE 0.9 % IV SOLN: 500.0000 mL | Freq: Once | INTRAVENOUS | Status: DC

## 2018-08-08 NOTE — Op Note (Signed)
Troy Sanchez Patient Name: Troy Sanchez Procedure Date: 08/08/2018 9:13 AM MRN: [id number] Endoscopist: Mallie Mussel L. Loletha Carrow , MD Age: 77 Referring MD:  Date of Birth: 1941-07-04 Gender: Male Account #: [id number] Procedure:                Upper GI endoscopy Indications:              Dysphagia, Heartburn Medicines:                Monitored Anesthesia Care Procedure:                Pre-Anesthesia Assessment:                           - Prior to the procedure, a History and Physical                            was performed, and patient medications and                            allergies were reviewed. The patient's tolerance of                            previous anesthesia was also reviewed. The risks                            and benefits of the procedure and the sedation                            options and risks were discussed with the patient.                            All questions were answered, and informed consent                            was obtained. Anticoagulants: The patient has taken                            aspirin. It was decided not to withhold this                            medication prior to the procedure. ASA Grade                            Assessment: II - A patient with mild systemic                            disease. After reviewing the risks and benefits,                            the patient was deemed in satisfactory condition to                            undergo the procedure.  After obtaining informed consent, the endoscope was                            passed under direct vision. Throughout the                            procedure, the patient's blood pressure, pulse, and                            oxygen saturations were monitored continuously. The                            Endoscope was introduced through the mouth, and                            advanced to the second part of duodenum. The upper                  GI endoscopy was accomplished without difficulty.                            The patient tolerated the procedure well. Scope In: Scope Out: Findings:                 The larynx was normal.                           LA Grade B (one or more mucosal breaks greater than                            5 mm, not extending between the tops of two mucosal                            folds) esophagitis was found in the lower third of                            the esophagus.                           The distal esophagus was tortuous.                           There is no endoscopic evidence of Barrett's                            esophagus, hiatal hernia or stricture in the entire                            esophagus.                           The stomach was normal.                           The cardia and gastric fundus were normal on  retroflexion.                           The examined duodenum was normal. Complications:            No immediate complications. Estimated Blood Loss:     Estimated blood loss: none. Impression:               - Normal larynx.                           - LA Grade B reflux esophagitis.                           - Tortuous esophagus.                           - Normal stomach.                           - Normal examined duodenum.                           - No specimens collected.                           Dysphagia appears to be a combination of lower                            esophageal anatomy and probable reflux-related                            dysmotility. Recommendation:           - Patient has a contact number available for                            emergencies. The signs and symptoms of potential                            delayed complications were discussed with the                            patient. Return to normal activities tomorrow.                            Written discharge instructions were provided to  the                            patient.                           - Resume previous diet.                           - Follow an antireflux regimen indefinitely.                           - Use Prilosec (omeprazole) 40 mg by mouth twice  daily , before meals, for 4 weeks, then decrease to                            once daily. (Disp#60, RF 2)                           - Return to my office in 6 weeks. Kiree Dejarnette L. Loletha Carrow, MD 08/08/2018 9:36:48 AM This report has been signed electronically.

## 2018-08-08 NOTE — Patient Instructions (Signed)
YOU HAD AN ENDOSCOPIC PROCEDURE TODAY AT [facility]:   Refer to the procedure report that was given to you for any specific questions about what was found during the examination.  If the procedure report does not answer your questions, please call your gastroenterologist to clarify.  If you requested that your care partner not be given the details of your procedure findings, then the procedure report has been included in a sealed envelope for you to review at your convenience later.  YOU SHOULD EXPECT: Some feelings of bloating in the abdomen. Passage of more gas than usual.  Walking can help get rid of the air that was put into your GI tract during the procedure and reduce the bloating. If you had a lower endoscopy (such as a colonoscopy or flexible sigmoidoscopy) you may notice spotting of blood in your stool or on the toilet paper. If you underwent a bowel prep for your procedure, you may not have a normal bowel movement for a few days.  Please Note:  You might notice some irritation and congestion in your nose or some drainage.  This is from the oxygen used during your procedure.  There is no need for concern and it should clear up in a day or so.  SYMPTOMS TO REPORT IMMEDIATELY:    Following upper endoscopy (EGD)  Vomiting of blood or coffee ground material  New chest pain or pain under the shoulder blades  Painful or persistently difficult swallowing  New shortness of breath  Fever of 100F or higher  Black, tarry-looking stools  Please see handout given to you on Reflux precautions and Esophagitis.  For urgent or emergent issues, a gastroenterologist can be reached at any hour by calling [phone].   DIET:  We do recommend a small meal at first, but then you may proceed to your regular diet.  Drink plenty of fluids but you should avoid alcoholic beverages for 24 hours.  ACTIVITY:  You should plan to take it easy for the rest of today and you should NOT DRIVE or  use heavy machinery until tomorrow (because of the sedation medicines used during the test).    FOLLOW UP: Our staff will call the number listed on your records the next business day following your procedure to check on you and address any questions or concerns that you may have regarding the information given to you following your procedure. If we do not reach you, we will leave a message.  However, if you are feeling well and you are not experiencing any problems, there is no need to return our call.  We will assume that you have returned to your regular daily activities without incident.  If any biopsies were taken you will be contacted by phone or by letter within the next 1-3 weeks.  Please call us at [phone] if you have not heard about the biopsies in 3 weeks.    SIGNATURES/CONFIDENTIALITY: You and/or your care partner have signed paperwork which will be entered into your electronic medical record.  These signatures attest to the fact that that the information above on your After Visit Summary has been reviewed and is understood.  Full responsibility of the confidentiality of this discharge information lies with you and/or your care-partner.  Thank you for letting us take care of your healthcare needs today.

## 2018-08-08 NOTE — Progress Notes (Signed)
Spontaneous respirations throughout. VSS. Resting comfortably. To PACU on room air. Report to  RN. 

## 2018-08-08 NOTE — Progress Notes (Signed)
Rx for Omeprazole sent to the patients pharmacy and he has an appt with Dr. Loletha Carrow made for a 6 week follow up on Dec 17. Sm

## 2018-08-08 NOTE — Progress Notes (Signed)
Pt's states no medical or surgical changes since previsit or office visit. 

## 2018-08-09 ENCOUNTER — Telehealth: Payer: Self-pay

## 2018-08-09 ENCOUNTER — Other Ambulatory Visit: Payer: Self-pay

## 2018-08-09 NOTE — Telephone Encounter (Signed)
  Follow up Call-  Call back number 08/08/2018  Post procedure Call Back phone  # [phone]  Permission to leave phone message Yes  Some recent data might be hidden     Patient questions:  Do you have a fever, pain , or abdominal swelling? No. Pain Score  0 *  Have you tolerated food without any problems? Yes.    Have you been able to return to your normal activities? Yes.    Do you have any questions about your discharge instructions: Diet   No. Medications  No. Follow up visit  No.  Do you have questions or concerns about your Care? No.  Actions: * If pain score is 4 or above: No action needed, pain <4.

## 2018-09-26 ENCOUNTER — Ambulatory Visit: Payer: Medicare Other | Admitting: Gastroenterology

## 2018-09-26 ENCOUNTER — Encounter: Payer: Self-pay | Admitting: Gastroenterology

## 2018-09-26 VITALS — BP 112/70 | HR 80 | Ht 68.75 in | Wt 219.1 lb

## 2018-09-26 DIAGNOSIS — R1314 Dysphagia, pharyngoesophageal phase: Secondary | ICD-10-CM | POA: Diagnosis not present

## 2018-09-26 DIAGNOSIS — K2100 Gastro-esophageal reflux disease with esophagitis, without bleeding: Secondary | ICD-10-CM

## 2018-09-26 DIAGNOSIS — R490 Dysphonia: Secondary | ICD-10-CM

## 2018-09-26 NOTE — Patient Instructions (Signed)
If you are age 76 or older, your body mass index should be between 23-30. Your Body mass index is 32.59 kg/m. If this is out of the aforementioned range listed, please consider follow up with your Primary Care Provider.  If you are age 71 or younger, your body mass index should be between 19-25. Your Body mass index is 32.59 kg/m. If this is out of the aformentioned range listed, please consider follow up with your Primary Care Provider.   Follow up as needed.   It was a pleasure to see you today!  Dr. Loletha Carrow

## 2018-09-26 NOTE — Progress Notes (Signed)
[facility] Progress Note  Chief Complaint: Reflux esophagitis  Subjective  History:  Troy Sanchez follows up after his upper endoscopy in late October.  He had seen me shortly before that for about 6 weeks of worsening reflux symptoms with dysphagia and also worsened chronic cough in the setting of underlying interstitial lung disease.  He had a previous fundoplication, and at the time of upper endoscopy it was found to be intact without endoscopic evidence of hiatal hernia.  There was grade B esophagitis, for which she was prescribed omeprazole 40 mg twice daily for 4 weeks, then decreasing to once daily.  He has been doing well since I last saw him, and is now down to once daily omeprazole, which he takes before breakfast meal.  His dysphagia and hoarseness have resolved, his appetite is good and his weight stable.  Mr. Lina has been working on an improved diet with less fried and processed food. He was not having nocturnal regurgitation or pyrosis, and is still not having any.  He and his wife have an adjustable bed which they keep elevated and he has found this very helpful for his respiratory condition.  ROS: Cardiovascular:  no chest pain Respiratory: no dyspnea  The patient's Past Medical, Family and Social History were reviewed and are on file in the EMR.  Objective:  Med list reviewed  Current Outpatient Medications:  .  amLODipine (NORVASC) 5 MG tablet, Take 5 mg by mouth daily., Disp: , Rfl: 1 .  aspirin 81 MG chewable tablet, Chew 81 mg by mouth every other day. , Disp: , Rfl:  .  atorvastatin (LIPITOR) 10 MG tablet, Take 10 mg by mouth daily., Disp: , Rfl:  .  cholecalciferol (VITAMIN D) 1000 UNITS tablet, Take 1,000 Units by mouth every morning. , Disp: , Rfl:  .  isosorbide mononitrate (IMDUR) 60 MG 24 hr tablet, Take 60 mg by mouth daily., Disp: , Rfl: 1 .  Multiple Vitamins-Minerals (MULTIVITAMIN WITH MINERALS) tablet, Take 1 tablet by mouth every morning. ,  Disp: , Rfl:  .  omeprazole (PRILOSEC) 40 MG capsule, Take 1 capsule (40 mg total) by mouth 2 (two) times daily before a meal. Take 40 mg twice a day before meals for 4 weeks then decrease to once a day., Disp: 60 capsule, Rfl: 2   Vital signs in last 24 hrs: Vitals:   09/26/18 0814  BP: 112/70  Pulse: 80    Physical Exam  He is well-appearing with normal vocal quality  HEENT: sclera anicteric, oral mucosa moist without lesions  Neck: supple, no thyromegaly, JVD or lymphadenopathy  Cardiac: RRR without murmurs, S1S2 heard, no peripheral edema  Pulm: clear to auscultation bilaterally, normal RR and effort noted  Abdomen: soft, no tenderness, with active bowel sounds. No guarding or palpable hepatosplenomegaly.  Skin; warm and dry, no jaundice or rash   @ASSESSMENTPLANBEGIN @ Assessment: Encounter Diagnoses  Name Primary?  . Pharyngoesophageal dysphagia Yes  . Gastroesophageal reflux disease with esophagitis   . Hoarseness    He is considerably improved on acid suppression, which I feel he should continue indefinitely given the finding of esophagitis and the severity of his symptoms when he first came to see me. He had already been elevating head of bed and rarely eats past about 7 in the evening.  His fundoplication is intact.  His upper respiratory symptoms have also improved indicating that he was having reflux to the proximal esophagus and perhaps upper airway.  This most  likely indicates he has been having reflux at night even if he does not note the symptoms of that.  He has been elevating head of bed quite some time.  Plan: Continue once daily omeprazole 40 mg before breakfast Attention to ongoing diet and lifestyle antireflux measures. I would be glad to see him as needed.   Total time 15 minutes, over half spent face-to-face with patient in counseling and coordination of care.   Nelida Meuse III   Copy to Dr. Deland Pretty

## 2018-10-27 ENCOUNTER — Other Ambulatory Visit: Payer: Self-pay | Admitting: Gastroenterology

## 2018-10-30 DIAGNOSIS — Z87442 Personal history of urinary calculi: Secondary | ICD-10-CM | POA: Diagnosis not present

## 2018-10-31 DIAGNOSIS — J029 Acute pharyngitis, unspecified: Secondary | ICD-10-CM | POA: Diagnosis not present

## 2018-11-23 DIAGNOSIS — E559 Vitamin D deficiency, unspecified: Secondary | ICD-10-CM | POA: Diagnosis not present

## 2018-11-23 DIAGNOSIS — I2729 Other secondary pulmonary hypertension: Secondary | ICD-10-CM | POA: Diagnosis not present

## 2018-11-23 DIAGNOSIS — E785 Hyperlipidemia, unspecified: Secondary | ICD-10-CM | POA: Diagnosis not present

## 2018-11-27 DIAGNOSIS — I2510 Atherosclerotic heart disease of native coronary artery without angina pectoris: Secondary | ICD-10-CM | POA: Diagnosis not present

## 2018-11-27 DIAGNOSIS — D72819 Decreased white blood cell count, unspecified: Secondary | ICD-10-CM | POA: Diagnosis not present

## 2018-11-27 DIAGNOSIS — J849 Interstitial pulmonary disease, unspecified: Secondary | ICD-10-CM | POA: Diagnosis not present

## 2018-11-27 DIAGNOSIS — I700 Atherosclerosis of aorta: Secondary | ICD-10-CM | POA: Diagnosis not present

## 2018-11-27 DIAGNOSIS — Z0000 Encounter for general adult medical examination without abnormal findings: Secondary | ICD-10-CM | POA: Diagnosis not present

## 2018-12-08 DIAGNOSIS — H10413 Chronic giant papillary conjunctivitis, bilateral: Secondary | ICD-10-CM | POA: Diagnosis not present

## 2018-12-11 DIAGNOSIS — S51832A Puncture wound without foreign body of left forearm, initial encounter: Secondary | ICD-10-CM | POA: Diagnosis not present

## 2018-12-11 DIAGNOSIS — W540XXA Bitten by dog, initial encounter: Secondary | ICD-10-CM | POA: Diagnosis not present

## 2018-12-11 DIAGNOSIS — S51812A Laceration without foreign body of left forearm, initial encounter: Secondary | ICD-10-CM | POA: Diagnosis not present

## 2018-12-11 DIAGNOSIS — Z23 Encounter for immunization: Secondary | ICD-10-CM | POA: Diagnosis not present

## 2018-12-15 DIAGNOSIS — H10413 Chronic giant papillary conjunctivitis, bilateral: Secondary | ICD-10-CM | POA: Diagnosis not present

## 2019-01-30 ENCOUNTER — Ambulatory Visit: Payer: Medicare Other | Admitting: Internal Medicine

## 2019-03-22 DIAGNOSIS — H2513 Age-related nuclear cataract, bilateral: Secondary | ICD-10-CM | POA: Diagnosis not present

## 2019-03-22 DIAGNOSIS — H04123 Dry eye syndrome of bilateral lacrimal glands: Secondary | ICD-10-CM | POA: Diagnosis not present

## 2019-03-22 DIAGNOSIS — H16223 Keratoconjunctivitis sicca, not specified as Sjogren's, bilateral: Secondary | ICD-10-CM | POA: Diagnosis not present

## 2019-03-22 DIAGNOSIS — H5203 Hypermetropia, bilateral: Secondary | ICD-10-CM | POA: Diagnosis not present

## 2019-04-16 ENCOUNTER — Ambulatory Visit: Payer: Medicare Other | Admitting: Internal Medicine

## 2019-04-24 ENCOUNTER — Other Ambulatory Visit: Payer: Self-pay

## 2019-04-24 DIAGNOSIS — N401 Enlarged prostate with lower urinary tract symptoms: Secondary | ICD-10-CM

## 2019-04-24 DIAGNOSIS — N138 Other obstructive and reflux uropathy: Secondary | ICD-10-CM

## 2019-04-24 DIAGNOSIS — I10 Essential (primary) hypertension: Secondary | ICD-10-CM

## 2019-04-24 MED ORDER — ISOSORBIDE MONONITRATE ER 60 MG PO TB24: 60.0000 mg | ORAL_TABLET | Freq: Every day | ORAL | 1 refills | Status: DC

## 2019-04-26 ENCOUNTER — Other Ambulatory Visit: Payer: Self-pay | Admitting: Gastroenterology

## 2019-04-26 ENCOUNTER — Other Ambulatory Visit: Payer: Self-pay | Admitting: Internal Medicine

## 2019-05-05 ENCOUNTER — Other Ambulatory Visit (HOSPITAL_COMMUNITY)
Admission: RE | Admit: 2019-05-05 | Discharge: 2019-05-05 | Disposition: A | Discharge status: Home / Self Care | Payer: Medicare Other | Source: Ambulatory Visit | Attending: Internal Medicine | Admitting: Internal Medicine

## 2019-05-05 DIAGNOSIS — Z1159 Encounter for screening for other viral diseases: Secondary | ICD-10-CM | POA: Insufficient documentation

## 2019-05-05 LAB — SARS CORONAVIRUS 2 (TAT 6-24 HRS): SARS Coronavirus 2: NEGATIVE

## 2019-05-07 NOTE — Progress Notes (Signed)
Left detailed msg on machine ok per DPR

## 2019-05-08 ENCOUNTER — Ambulatory Visit (INDEPENDENT_AMBULATORY_CARE_PROVIDER_SITE_OTHER): Payer: Medicare Other | Admitting: Internal Medicine

## 2019-05-08 ENCOUNTER — Encounter: Payer: Self-pay | Admitting: Internal Medicine

## 2019-05-08 ENCOUNTER — Other Ambulatory Visit: Payer: Self-pay

## 2019-05-08 ENCOUNTER — Ambulatory Visit: Payer: Medicare Other | Admitting: Internal Medicine

## 2019-05-08 DIAGNOSIS — R0600 Dyspnea, unspecified: Secondary | ICD-10-CM

## 2019-05-08 DIAGNOSIS — R0609 Other forms of dyspnea: Secondary | ICD-10-CM | POA: Diagnosis not present

## 2019-05-08 LAB — PULMONARY FUNCTION TEST
DL/VA % pred: 85 %
DL/VA: 3.37 ml/min/mmHg/L
DLCO unc % pred: 67 %
DLCO unc: 16.92 ml/min/mmHg
FEF 25-75 Post: 3 L/sec
FEF 25-75 Pre: 2.83 L/sec
FEF2575-%Change-Post: 6 %
FEF2575-%Pred-Post: 137 %
FEF2575-%Pred-Pre: 129 %
FEV1-%Change-Post: 1 %
FEV1-%Pred-Post: 99 %
FEV1-%Pred-Pre: 98 %
FEV1-Post: 3.02 L
FEV1-Pre: 2.98 L
FEV1FVC-%Change-Post: 2 %
FEV1FVC-%Pred-Pre: 110 %
FEV6-%Change-Post: -2 %
FEV6-%Pred-Post: 92 %
FEV6-%Pred-Pre: 94 %
FEV6-Post: 3.66 L
FEV6-Pre: 3.73 L
FEV6FVC-%Change-Post: 0 %
FEV6FVC-%Pred-Post: 106 %
FEV6FVC-%Pred-Pre: 106 %
FVC-%Change-Post: -1 %
FVC-%Pred-Post: 88 %
FVC-%Pred-Pre: 89 %
FVC-Post: 3.69 L
FVC-Pre: 3.74 L
Post FEV1/FVC ratio: 82 %
Post FEV6/FVC ratio: 100 %
Pre FEV1/FVC ratio: 80 %
Pre FEV6/FVC Ratio: 100 %
RV % pred: 76 %
RV: 1.98 L
TLC % pred: 81 %
TLC: 5.71 L

## 2019-05-08 LAB — SEDIMENTATION RATE: Sed Rate: 7 mm/hr (ref 0–20)

## 2019-05-08 NOTE — Progress Notes (Signed)
Subjective:     Patient ID: Troy Sanchez, male   DOB: 1941-02-25,    MRN: [id number]    Brief patient profile: 53 yowm never smoker with onset of doe around late spring  2017  > referred to [facility] who dx [place] by [place] 05/2016 and concerned about crackles on exam 10/31/17 so referred to pulmonary clinic 11/04/2017 by Dr   Nadyne Coombes     History of Present Illness  11/04/2017 [facility] office visit/ Chava Dulac   Chief Complaint  Patient presents with  . Pulmonary Consult    Referred by Dr. Einar Gip for eval of pulmonary hypertension.  He states he has had DOE for the past 2 yrs. He states he only gets winded when he walks up an incline.    indolent onset progressive doe = MMRC2 = can't walk a nl pace on a flat grade s sob but does fine slow and flat  Really minimal progression since onset and on no meds for PAH nor resp rx and no assoc  Cough  rec No change rx       01/24/2018  f/u ov/Prakash Kimberling re: doe since around 2017 only hills/fast pace Chief Complaint  Patient presents with  . Follow-up    follow up after CT scan. Patient had CT scan on 01/20/18. Denies any current issues.  Dyspnea: still MMRC1 = can walk nl pace, flat grade, can't hurry or go uphills or steps s sob   Cough: none Sleep: ok rec No change/ monitor sats    07/31/2018  f/u ov/Dondrea Clendenin re:  Doe is getting slt worse for same activty onset around 2017 with ? NSIP / not monitoring sats as rec  Chief Complaint  Patient presents with  . Follow-up    dyspnea on exertion. worsened indigestion   Dyspnea:  Min decreased ex tol Cough: at times assoc with overt sense of mild gerd , no longer on fish oil> gi w/u in progress but not on rx yet  Sleeping: on side maybe 10-20 degrees rec I will defer to GI how to treat your reflux  Monitor your 02 levels with exercise - presently staying in mid 90's  GERD diet  Please schedule a follow up visit in 6  months but call sooner if needed with pfts on return    05/08/2019  f/u ov/Akansha Wyche re: PF ?  Etiology  Chief Complaint  Patient presents with  . Follow-up    PFT's done today. Breathing has been progressively worse since the last visit. He feels a tightness in his chest occ.   Dyspnea:  Park and back each am and sometimes at lunch if not too hot then again in pm and not as easy as it was now due to heat/ sob  Cough: none Sleeping: on side 10 degrees hob electric  SABA use: none 02:  None    No obvious day to day or daytime variability or assoc excess/ purulent sputum or mucus plugs or hemoptysis or cp or chest tightness, subjective wheeze or overt sinus or hb symptoms.   Sleeping  without nocturnal  or early am exacerbation  of respiratory  c/o's or need for noct saba. Also denies any obvious fluctuation of symptoms with weather or environmental changes or other aggravating or alleviating factors except as outlined above   No unusual exposure hx or h/o childhood pna/ asthma or knowledge of premature birth.  Current Allergies, Complete Past Medical History, Past Surgical History, Family History, and Social History were reviewed in  [facility] electronic medical record.  ROS  The following are not active complaints unless bolded Hoarseness, sore throat, dysphagia, dental problems, itching, sneezing,  nasal congestion or discharge of excess mucus or purulent secretions, ear ache,   fever, chills, sweats, unintended wt loss or wt gain, classically pleuritic or exertional cp,  orthopnea pnd or arm/hand swelling  or leg swelling, presyncope, palpitations, abdominal pain, anorexia, nausea, vomiting, diarrhea  or change in bowel habits or change in bladder habits, change in stools or change in urine, dysuria, hematuria,  rash, arthralgias, visual complaints, headache, numbness, weakness or ataxia or problems with walking or coordination,  change in mood or  memory.        Current Meds  Medication Sig  . amLODipine (NORVASC) 5 MG tablet Take 5 mg by mouth daily.  Marland Kitchen aspirin 81 MG  chewable tablet Chew 81 mg by mouth every other day.   Marland Kitchen atorvastatin (LIPITOR) 10 MG tablet Take 10 mg by mouth daily.  . cholecalciferol (VITAMIN D) 1000 UNITS tablet Take 1,000 Units by mouth every morning.   . isosorbide mononitrate (IMDUR) 60 MG 24 hr tablet Take 1 tablet (60 mg total) by mouth daily.  . Multiple Vitamins-Minerals (MULTIVITAMIN WITH MINERALS) tablet Take 1 tablet by mouth every morning.   Marland Kitchen omeprazole (PRILOSEC) 40 MG capsule TAKE 1 CAPSULE BY MOUTH 2 TIMES DAILY BEFORE A MEAL FOR 4 WEEKS THEN DECREASE TO ONE A DAY            Objective:   Physical Exam     05/08/2019        217  07/31/2018      219  01/24/2018        212   12/09/2017        209   11/04/17 206 lb (93.4 kg)  05/18/16 215 lb (97.5 kg)  08/15/14 203 lb 14.8 oz (92.5 kg)       HEENT: nl dentition, turbinates bilaterally, and oropharynx. Nl external ear canals without cough reflex   NECK :  without JVD/Nodes/TM/ nl carotid upstrokes bilaterally   LUNGS: no acc muscle use,   contour chest  Mild pectus/    insp crackles bases bilaterally without cough on insp or exp maneuvers   CV:  RRR  no s3 or murmur or increase in P2, and no edema   ABD:  soft and nontender with nl inspiratory excursion in the supine position. No bruits or organomegaly appreciated, bowel sounds nl  MS:  Nl gait/ ext warm without deformities, calf tenderness, cyanosis  - No def clubbing No obvious joint restrictions   SKIN: warm and dry without lesions    NEURO:  alert, approp, nl sensorium with  no motor or cerebellar deficits apparent.         Labs ordered 05/08/2019   HSP/ CVD profile      Lab Results  Component Value Date   ESRSEDRATE 7 05/08/2019     Assessment:

## 2019-05-08 NOTE — Progress Notes (Signed)
Full PFT performed today. °

## 2019-05-08 NOTE — Patient Instructions (Addendum)
  Monitor your saturations toward the end of your walk  - goal is to keep it above 90%    Please remember to go to the lab department   for your tests - we will call you with the results when they are available.       We will call to schedule you for HRCT chest      Please schedule a follow up visit in 6 months but call sooner if needed

## 2019-05-12 ENCOUNTER — Encounter: Payer: Self-pay | Admitting: Internal Medicine

## 2019-05-12 NOTE — Assessment & Plan Note (Addendum)
Onset spring 2017 [place] 05/18/16 with PA mean  17 and wedge 5 and CO  = 5.65  So PVR = 12/5.65 x 80 = 170 (wnl) Echo 10/31/17 :  Low nl with borderline global hypokinesis ef 50-55% and Mild LAE/ RVE / trace MR  - no change vs 04/28/16  - 11/04/2017  Walked RA x 3 laps @ 185 ft each stopped due to  End of study, fast pace, no sob or desat    - PFT's  12/09/2017   FVC 3.82 (90%)  No obst    p nothing prior to study with DLCO ( 19.63)60 % corrects to (4.04) 87  % for alv volume   - 12/09/2017  Walked RA x 3 laps @ 185 ft each stopped due to  End of study, fast pace, no sob or desat    - ONO RA 12/12/17 desats < 89% x 26 min  - in absence of PH /symptoms do not rec rx  - HRCT 01/20/2018 1. Appearance of the lungs is compatible with interstitial lung disease. CT pattern is considered indeterminate for usual interstitial pneumonia (UIP). 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 - 01/24/2018  Walked RA x 3 laps @ 185 ft each stopped due to  End of study,fast  pace, no sob or desat    - 60mw 07/31/2018   378 mild sob no desats  - 05/08/2019   Walked RA  2 laps @  approx 280ft each @ avg pace  stopped due to  End of study min sob, no cp, sats 94% - PFT's  05/08/2019  FVC  3.74 (89%) prior to study with DLCO  16.92 (67%) corrects to 3.37 (85%)  for alv volume and FV curve s convexity    His actual dlco is dropping vs priors though no real change in [facility]  Best to go ahead therefore now and eval with HRCT and w/u for occult connective tissue dz   Discussed in detail all the  indications, usual  risks and alternatives  relative to the benefits with patient who agrees to proceed with w/u as outlined.     I had an extended discussion with the patient reviewing all relevant studies completed to date and  lasting 15 to 20 minutes of a 25 minute visit  which included directly observing ambulatory 02  saturation study documented in a/p section of  today's  office note.  Each maintenance medication was reviewed in detail including most importantly the difference between maintenance and prns and under what circumstances the prns are to be triggered using an action plan format that is not reflected in the computer generated alphabetically organized AVS.     Please see AVS for specific instructions unique to this visit that I personally wrote and verbalized to the the pt in detail and then reviewed with pt  by my nurse highlighting any changes in therapy recommended at today's visit .

## 2019-05-13 LAB — HYPERSENSITIVITY PNUEMONITIS PROFILE
ASPERGILLUS FUMIGATUS: NEGATIVE
Faenia retivirgula: NEGATIVE
Pigeon Serum: NEGATIVE
S. VIRIDIS: NEGATIVE
T. CANDIDUS: NEGATIVE
T. VULGARIS: NEGATIVE

## 2019-05-13 LAB — RHEUMATOID FACTOR: Rheumatoid fact SerPl-aCnc: <14 IU/mL (ref <14)

## 2019-05-13 LAB — ANA: Anti Nuclear Antibody (ANA): NEGATIVE

## 2019-05-13 LAB — CYCLIC CITRUL PEPTIDE ANTIBODY, IGG: Cyclic Citrullin Peptide Ab: <16 UNITS

## 2019-05-14 ENCOUNTER — Encounter: Payer: Self-pay | Admitting: Internal Medicine

## 2019-05-14 DIAGNOSIS — J8489 Other specified interstitial pulmonary diseases: Secondary | ICD-10-CM | POA: Insufficient documentation

## 2019-06-01 ENCOUNTER — Other Ambulatory Visit: Payer: Self-pay

## 2019-06-01 ENCOUNTER — Ambulatory Visit (INDEPENDENT_AMBULATORY_CARE_PROVIDER_SITE_OTHER)
Admission: RE | Admit: 2019-06-01 | Discharge: 2019-06-01 | Disposition: A | Discharge status: Home / Self Care | Payer: Medicare Other | Source: Ambulatory Visit | Attending: Internal Medicine | Admitting: Internal Medicine

## 2019-06-01 DIAGNOSIS — I272 Pulmonary hypertension, unspecified: Secondary | ICD-10-CM | POA: Diagnosis not present

## 2019-06-01 DIAGNOSIS — R0609 Other forms of dyspnea: Secondary | ICD-10-CM | POA: Diagnosis not present

## 2019-06-01 DIAGNOSIS — R0600 Dyspnea, unspecified: Secondary | ICD-10-CM

## 2019-06-01 DIAGNOSIS — J84112 Idiopathic pulmonary fibrosis: Secondary | ICD-10-CM | POA: Diagnosis not present

## 2019-06-01 DIAGNOSIS — I2510 Atherosclerotic heart disease of native coronary artery without angina pectoris: Secondary | ICD-10-CM | POA: Diagnosis not present

## 2019-06-04 NOTE — Progress Notes (Signed)
Spoke with pt and notified of results per Dr. Wert. Pt verbalized understanding and denied any questions. 

## 2019-06-08 ENCOUNTER — Other Ambulatory Visit: Payer: Self-pay

## 2019-06-08 MED ORDER — ATORVASTATIN CALCIUM 10 MG PO TABS: 10.0000 mg | ORAL_TABLET | Freq: Every day | ORAL | 0 refills | Status: DC

## 2019-07-02 ENCOUNTER — Ambulatory Visit: Payer: Medicare Other | Admitting: Cardiology

## 2019-07-02 ENCOUNTER — Other Ambulatory Visit: Payer: Self-pay

## 2019-07-02 ENCOUNTER — Encounter: Payer: Self-pay | Admitting: Cardiology

## 2019-07-02 VITALS — BP 119/79 | HR 57 | Ht 70.0 in | Wt 216.3 lb

## 2019-07-02 DIAGNOSIS — I2510 Atherosclerotic heart disease of native coronary artery without angina pectoris: Secondary | ICD-10-CM

## 2019-07-02 DIAGNOSIS — I10 Essential (primary) hypertension: Secondary | ICD-10-CM | POA: Diagnosis not present

## 2019-07-02 DIAGNOSIS — R0609 Other forms of dyspnea: Secondary | ICD-10-CM

## 2019-07-02 DIAGNOSIS — R0600 Dyspnea, unspecified: Secondary | ICD-10-CM

## 2019-07-02 HISTORY — DX: Atherosclerotic heart disease of native coronary artery without angina pectoris: I25.10

## 2019-07-02 HISTORY — DX: Essential (primary) hypertension: I10

## 2019-07-02 NOTE — Progress Notes (Signed)
Primary Physician:  Deland Pretty, MD   Patient ID: Troy Sanchez, male    DOB: 1941-08-27, 78 y.o.   MRN: [id number]  Subjective:    Chief Complaint  Patient presents with  . Coronary Artery Disease  . Follow-up    HPI: Troy Sanchez  is a 78 y.o. male  with hypertension, hyperlipidemia, chronic dyspnea and also pulmonary scarring noted on CT scan performed in 2019; therefore, dyspnea felt to be related to ILD. Also has history of esophageal dilation. He has moderate CAD by coronary angiography in 2017 which revealed moderate disease of 50% and LAD and circumflex coronary artery with normal LVEF and mild pulmonary hypertension. Here for 1 year office visit. States dyspnea is stable, has mild leg edema. No chest pain or palpitations.   Echocardiogram 10/31/2017 revealed normal LVEF with mild RV strain. He was referred to Dr. Melvyn Novas for evaluation of dyspnea on exertion and pulmonary scarring noted on CXR who has performed CT scan of the chest and changes are related to interstitial lung disease.  Past Medical History:  Diagnosis Date  . CAD (coronary artery disease) 07/02/2019  . Hiatal hernia   . HTN (hypertension) 07/02/2019  . Hypertension     Past Surgical History:  Procedure Laterality Date  . CARDIAC CATHETERIZATION N/A 05/18/2016   Procedure: Right/Left Heart Cath and Coronary Angiography;  Surgeon: Adrian Prows, MD;  Location: [facility];  Service: Cardiovascular;  Laterality: N/A;  . CHOLECYSTECTOMY  2006  . GASTROSTOMY N/A 03/06/2014   Procedure: GASTROSTOMY;  Surgeon: [name], MD;  Location: [facility];  Service: General;  Laterality: N/A;  . HIATAL HERNIA REPAIR N/A 03/06/2014   Procedure: HERNIA REPAIR HIATAL;  Surgeon: [name], MD;  Location: [facility];  Service: General;  Laterality: N/A;  . LAPAROTOMY N/A 03/06/2014   Procedure: EXPLORATORY LAPAROTOMY;  Surgeon: [name], MD;  Location: [facility];  Service: General;  Laterality: N/A;  . TRANSURETHRAL  RESECTION OF PROSTATE N/A 08/15/2014   Procedure: TRANSURETHRAL RESECTION OF THE PROSTATE WITH GYRUS INSTRUMENTS;  Surgeon: Malka So, MD;  Location: [facility];  Service: Urology;  Laterality: N/A;  . UMBILICAL HERNIA REPAIR      Social History   Socioeconomic History  . Marital status: Married    Spouse name: Not on file  . Number of children: 2  . Years of education: Not on file  . Highest education level: Not on file  Occupational History  . Not on file  Social Needs  . Financial resource strain: Not on file  . Food insecurity    Worry: Not on file    Inability: Not on file  . Transportation needs    Medical: Not on file    Non-medical: Not on file  Tobacco Use  . Smoking status: Never Smoker  . Smokeless tobacco: Never Used  Substance and Sexual Activity  . Alcohol use: Yes    Comment: ocassionally  . Drug use: No  . Sexual activity: Never  Lifestyle  . Physical activity    Days per week: Not on file    Minutes per session: Not on file  . Stress: Not on file  Relationships  . Social Herbalist on phone: Not on file    Gets together: Not on file    Attends religious service: Not on file    Active member of club or organization: Not on file    Attends meetings of clubs or  organizations: Not on file    Relationship status: Not on file  . Intimate partner violence    Fear of current or ex partner: Not on file    Emotionally abused: Not on file    Physically abused: Not on file    Forced sexual activity: Not on file  Other Topics Concern  . Not on file  Social History Narrative  . Not on file    Review of Systems  Constitution: Negative for decreased appetite, malaise/fatigue, weight gain and weight loss.  Eyes: Negative for visual disturbance.  Cardiovascular: Positive for dyspnea on exertion and leg swelling. Negative for chest pain, claudication, orthopnea, palpitations and syncope.  Respiratory: Negative for cough, hemoptysis and wheezing.    Endocrine: Negative for cold intolerance and heat intolerance.  Hematologic/Lymphatic: Does not bruise/bleed easily.  Skin: Negative for nail changes.  Musculoskeletal: Negative for muscle weakness and myalgias.  Gastrointestinal: Positive for heartburn. Negative for abdominal pain, change in bowel habit, nausea and vomiting.  Neurological: Negative for difficulty with concentration, dizziness, focal weakness and headaches.  Psychiatric/Behavioral: Negative for altered mental status and suicidal ideas.  All other systems reviewed and are negative.     Objective:  Blood pressure 119/79, pulse (!) 57, height '5\' 10"'$  (1.778 m), weight 216 lb 4.8 oz (98.1 kg), SpO2 96 %. Body mass index is 31.04 kg/m.    Physical Exam  Constitutional: He is oriented to person, place, and time. Vital signs are normal. He appears well-developed and well-nourished.  HENT:  Head: Normocephalic and atraumatic.  Neck: Normal range of motion.  Cardiovascular: Normal rate, regular rhythm, normal heart sounds, intact distal pulses and normal pulses.  2 plus pitting ankle edema. No JVD  Pulmonary/Chest: Effort normal. No accessory muscle usage. No respiratory distress. He has rales (bilateral bases, left > Right, coarse leathery crackles).  End inspiratory coarse crackles at bilateral bases, left more prominent than right  Abdominal: Soft. Bowel sounds are normal.  Musculoskeletal: Normal range of motion.  Neurological: He is alert and oriented to person, place, and time.  Skin: Skin is warm and dry.  Vitals reviewed.  Radiology:  High-resolution CT scan 01/20/2018: Aortic and coronary atherosclerosis. Patchy peripheral predominant areas of groundglass attenuation, septal thickening and mild subpleural reticulation. Scattered areas of cylindrical bronchiectasis and peripheral bronchiectasis. No significant progression compared to 04/21/2016. Appearance of the lungs is compatible with interstitial lung  disease.  Laboratory examination:    11/16/2017: RBC 5.4, hemoglobin 15.6, hematocrit 47.3, CBC otherwise normal. Creatinine 1.1, EGFR 65, potassium 4.6, CMP normal. Hemoglobin A1c 5.3%. Cholesterol 133, HDL 42, triglycerides 89, LDL 73. Vitamin D 37.  CMP Latest Ref Rng & Units 08/06/2014 03/08/2014 03/07/2014  Glucose 70 - 99 mg/dL 89 127(H) 141(H)  BUN 6 - 23 mg/dL '12 15 19  '$ Creatinine 0.50 - 1.35 mg/dL 1.03 0.98 1.04  Sodium 137 - 147 mEq/L 141 145 144  Potassium 3.7 - 5.3 mEq/L 4.7 4.1 4.6  Chloride 96 - 112 mEq/L 101 108 110  CO2 19 - 32 mEq/L '31 25 26  '$ Calcium 8.4 - 10.5 mg/dL 9.7 8.7 8.4  Total Protein 6.0 - 8.3 g/dL - 5.7(L) 5.3(L)  Total Bilirubin 0.3 - 1.2 mg/dL - 2.4(H) 2.4(H)  Alkaline Phos 39 - 117 U/L - 52 44  AST 0 - 37 U/L - 45(H) 47(H)  ALT 0 - 53 U/L - 36 41   CBC Latest Ref Rng & Units 08/06/2014 03/14/2014 03/08/2014  WBC 4.0 - 10.5 K/uL 4.9 7.3 8.9  Hemoglobin 13.0 - 17.0 g/dL 15.1 12.0(L) 13.2  Hematocrit 39.0 - 52.0 % 45.1 34.6(L) 39.8  Platelets 150 - 400 K/uL 173 243 183   Lipid Panel  No results found for: CHOL, TRIG, HDL, CHOLHDL, VLDL, LDLCALC, LDLDIRECT HEMOGLOBIN A1C No results found for: HGBA1C, MPG TSH No results for input(s): TSH in the last 8760 hours.  PRN Meds:. There are no discontinued medications. Current Meds  Medication Sig  . amLODipine (NORVASC) 5 MG tablet Take 5 mg by mouth daily.  Marland Kitchen aspirin 81 MG chewable tablet Chew 81 mg by mouth every other day.   Marland Kitchen atorvastatin (LIPITOR) 10 MG tablet Take 1 tablet (10 mg total) by mouth daily.  . cholecalciferol (VITAMIN D) 1000 UNITS tablet Take 1,000 Units by mouth every morning.   . isosorbide mononitrate (IMDUR) 60 MG 24 hr tablet Take 1 tablet (60 mg total) by mouth daily.  . Multiple Vitamins-Minerals (MULTIVITAMIN WITH MINERALS) tablet Take 1 tablet by mouth every morning.   Marland Kitchen omeprazole (PRILOSEC) 40 MG capsule TAKE 1 CAPSULE BY MOUTH 2 TIMES DAILY BEFORE A MEAL FOR 4 WEEKS THEN DECREASE  TO ONE A DAY    Cardiac Studies:   Echocardiogram 10/31/2017: Poor echo window. Wall motion abnormality has reduced sensitivity. Left ventricle cavity is normal in size. LV systolic function appears to be low normal with borderline global hypokinesis. Visual EF is 50-55%. Normal diastolic filling pattern. Left atrial cavity is mildly dilated at 4.1 cm. Right ventricle cavity is mildly dilated. Normal right ventricular function. A moderateor band is noted at the RV apex (normal variant). Trileaflet aortic valve with mild (Grade I) regurgitation. Trace tricuspid regurgitation. Unable to estimate PA pressure due to absence/minimal TR signal. Compared to the study done on 04/28/2016, no significant change.  Heart Cath 05/18/2016: Mild pulmonary hypertension PA 37/15, mean 25 mmHg. Wedge low at 10 mm Hg. CI decreased. Moderate CAD, Mid LAD 50% and Mid to dist Cx 50%. 30% Mid RCA. LVEF 50-55%.  Assessment:   Coronary artery disease involving native coronary artery of native heart without angina pectoris - Plan: EKG 12-Lead  Essential hypertension  Dyspnea on exertion  EKG 07/02/2019: Sinus bradycardia at rate of 54 bpm, left axis deviation, left anterior fascicular block.  Right bundle branch block.  Poor R-wave progression, Low-voltage complexes.  Pulmonary disease pattern. No significant change from  EKG 07/03/2018.  Recommendations:   Mr. Brier is a pleasant male with mild hypertension, dyspnea and also pulmonary scarring noted on CT scan performed in 2017 and chronic stable dyspnea. He has moderate CAD by coronary angiography in 2017 which revealed moderate disease of 50% and LAD and circumflex coronary artery with normal LVEF and mild pulmonary hypertension. PMH includes hypertension, hyperlipidemia.  Since being on isosorbide mononitrate, amlodipine, blood pressure is very well controlled, she remains without any symptoms of angina, no change in his physical exam and EKG also remained  stable. He is on a statin.   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  Unless he notices decreased exercise tolerance, dizziness, no further evaluation is indicated with regard to bradycardia.  I will see him back on a p.r.n. basis.  Adrian Prows, MD, [facility] 07/02/2019, 9:11 AM [facility]. [place] Pager: [phone] Office: [phone] If no answer Cell [phone]

## 2019-09-01 DIAGNOSIS — Z03818 Encounter for observation for suspected exposure to other biological agents ruled out: Secondary | ICD-10-CM | POA: Diagnosis not present

## 2019-09-01 DIAGNOSIS — Z1159 Encounter for screening for other viral diseases: Secondary | ICD-10-CM | POA: Diagnosis not present

## 2019-09-07 ENCOUNTER — Other Ambulatory Visit: Payer: Self-pay | Admitting: Cardiology

## 2019-09-07 DIAGNOSIS — I10 Essential (primary) hypertension: Secondary | ICD-10-CM

## 2019-09-11 ENCOUNTER — Other Ambulatory Visit: Payer: Self-pay

## 2019-09-11 MED ORDER — AMLODIPINE BESYLATE 5 MG PO TABS: 5.0000 mg | ORAL_TABLET | Freq: Every day | ORAL | 1 refills | Status: DC

## 2019-11-08 ENCOUNTER — Ambulatory Visit: Payer: Medicare Other | Admitting: Internal Medicine

## 2019-11-12 ENCOUNTER — Ambulatory Visit: Payer: Medicare Other | Admitting: Internal Medicine

## 2019-11-12 ENCOUNTER — Encounter: Payer: Self-pay | Admitting: Internal Medicine

## 2019-11-12 ENCOUNTER — Other Ambulatory Visit: Payer: Self-pay

## 2019-11-12 DIAGNOSIS — J8489 Other specified interstitial pulmonary diseases: Secondary | ICD-10-CM | POA: Diagnosis not present

## 2019-11-12 NOTE — Patient Instructions (Signed)
Make sure you check your oxygen saturations at highest level of activity to be sure it stays over 90% and call me if trending downward   Please schedule a follow up visit in 8  months but call sooner if needed

## 2019-11-12 NOTE — Progress Notes (Signed)
Subjective:     Patient ID: Troy Sanchez, male   DOB: 30-Aug-1941,    MRN: [id number]    Brief patient profile: 18 yowm never smoker with onset of doe around late spring  2017  > referred to [facility] who dx [place] by [place] 05/2016 and concerned about crackles on exam 10/31/17 so referred to pulmonary clinic 11/04/2017 by Dr   Nadyne Coombes     History of Present Illness  11/04/2017 1st [facility] office visit/ Quame Spratlin   Chief Complaint  Patient presents with  . Pulmonary Consult    Referred by Dr. Einar Gip for eval of pulmonary hypertension.  He states he has had DOE for the past 2 yrs. He states he only gets winded when he walks up an incline.    indolent onset progressive doe = MMRC2 = can't walk a nl pace on a flat grade s sob but does fine slow and flat  Really minimal progression since onset and on no meds for PAH nor resp rx and no assoc  Cough  rec No change rx       01/24/2018  f/u ov/Drayce Tawil re: doe since around 2017 only hills/fast pace Chief Complaint  Patient presents with  . Follow-up    follow up after CT scan. Patient had CT scan on 01/20/18. Denies any current issues.  Dyspnea: still MMRC1 = can walk nl pace, flat grade, can't hurry or go uphills or steps s sob   Cough: none Sleep: ok rec No change/ monitor sats    07/31/2018  f/u ov/Bryker Fletchall re:  Doe is getting slt worse for same activty onset around 2017 with ? NSIP / not monitoring sats as rec  Chief Complaint  Patient presents with  . Follow-up    dyspnea on exertion. worsened indigestion   Dyspnea:  Min decreased ex tol Cough: at times assoc with overt sense of mild gerd , no longer on fish oil> gi w/u in progress but not on rx yet  Sleeping: on side maybe 10-20 degrees rec I will defer to GI how to treat your reflux  Monitor your 02 levels with exercise - presently staying in mid 90's  GERD diet  Please schedule a follow up visit in 6  months but call sooner if needed with pfts on return    05/08/2019  f/u ov/Yianni Skilling re: PF  ? Etiology  Chief Complaint  Patient presents with  . Follow-up    PFT's done today. Breathing has been progressively worse since the last visit. He feels a tightness in his chest occ.   Dyspnea:  Park and back each am and sometimes at lunch if not too hot then again in pm and not as easy as it was now due to heat/ sob  Cough: none Sleeping: on side 10 degrees hob electric  SABA use: none 02:  None       11/12/2019  f/u ov/Byrd Terrero re:  PF  Chief Complaint  Patient presents with  . Follow-up    Breathing is doing well and no new co's today.   Dyspnea:  Walking neighborhood 15-20 min and stops on steep hills, does not check sats as rec  Cough: none Sleeping: 10 degrees s resp symptoms  SABA use: none 02: none    No obvious day to day or daytime variability or assoc excess/ purulent sputum or mucus plugs or hemoptysis or cp or chest tightness, subjective wheeze or overt sinus or hb symptoms.   Sleeping  without nocturnal  or  early am exacerbation  of respiratory  c/o's or need for noct saba. Also denies any obvious fluctuation of symptoms with weather or environmental changes or other aggravating or alleviating factors except as outlined above   No unusual exposure hx or h/o childhood pna/ asthma or knowledge of premature birth.  Current Allergies, Complete Past Medical History, Past Surgical History, Family History, and Social History were reviewed in [employer] record.  ROS  The following are not active complaints unless bolded Hoarseness, sore throat, dysphagia, dental problems, itching, sneezing,  nasal congestion or discharge of excess mucus or purulent secretions, ear ache,   fever, chills, sweats, unintended wt loss or wt gain, classically pleuritic or exertional cp,  orthopnea pnd or arm/hand swelling  or leg swelling, presyncope, palpitations, abdominal pain, anorexia, nausea, vomiting, diarrhea  or change in bowel habits or change in bladder habits, change  in stools or change in urine, dysuria, hematuria,  rash, arthralgias, visual complaints, headache, numbness, weakness or ataxia or problems with walking or coordination,  change in mood or  memory.        Current Meds  Medication Sig  . amLODipine (NORVASC) 5 MG tablet Take 1 tablet (5 mg total) by mouth daily.  Marland Kitchen aspirin 81 MG chewable tablet Chew 81 mg by mouth every other day.   Marland Kitchen atorvastatin (LIPITOR) 10 MG tablet TAKE 1 TABLET BY MOUTH EVERY DAY  . cholecalciferol (VITAMIN D) 1000 UNITS tablet Take 1,000 Units by mouth every morning.   . isosorbide mononitrate (IMDUR) 60 MG 24 hr tablet TAKE 1 TABLET BY MOUTH EVERY DAY  . Multiple Vitamins-Minerals (MULTIVITAMIN WITH MINERALS) tablet Take 1 tablet by mouth every morning.   Marland Kitchen omeprazole (PRILOSEC) 40 MG capsule TAKE 1 CAPSULE BY MOUTH 2 TIMES DAILY BEFORE A MEAL FOR 4 WEEKS THEN DECREASE TO ONE A DAY               Objective:   Physical Exam    11/12/2019          217  05/08/2019        217  07/31/2018      219  01/24/2018        212   12/09/2017        209   11/04/17 206 lb (93.4 kg)  05/18/16 215 lb (97.5 kg)  08/15/14 203 lb 14.8 oz (92.5 kg)     Vital signs reviewed  11/12/2019  - Note at rest 02 sats  97% on RA     HEENT : pt wearing mask not removed for exam due to covid -19 concerns.    NECK :  without JVD/Nodes/TM/ nl carotid upstrokes bilaterally   LUNGS: no acc muscle use,  Chest with mild pectus deformity with insp crackles in bases bilaterally  which is clear to A and P bilaterally without cough on insp or exp maneuvers   CV:  RRR  no s3 or murmur or increase in P2, and no edema   ABD:  soft and nontender with nl inspiratory excursion in the supine position. No bruits or organomegaly appreciated, bowel sounds nl  MS:  Nl gait/ ext warm without deformities, calf tenderness, cyanosis or clubbing No obvious joint restrictions   SKIN: warm and dry without lesions    NEURO:  alert, approp, nl sensorium with  no  motor or cerebellar deficits apparent.           Assessment:

## 2019-11-15 ENCOUNTER — Encounter: Payer: Self-pay | Admitting: Internal Medicine

## 2019-11-15 NOTE — Assessment & Plan Note (Addendum)
Onset of symptoms  spring 2017 [place] 05/18/16 with PA mean  17 and wedge 5 and CO  = 5.65  So PVR = 12/5.65 x 80 = 170 (wnl) Echo 10/31/17 :  Low nl with borderline global hypokinesis ef 50-55% and Mild LAE/ RVE / trace MR  - no change vs 04/28/16  - 11/04/2017  Walked RA x 3 laps @ 185 ft each stopped due to  End of study, fast pace, no sob or desat    - PFT's  12/09/2017   FVC 3.82 (90%)  No obst    p nothing prior to study with DLCO ( 19.63) 60 % corrects to (4.04) 87  % for alv volume   - 12/09/2017  Walked RA x 3 laps @ 185 ft each stopped due to  End of study, fast pace, no sob or desat    - ONO RA 12/12/17 desats < 89% x 26 min  - in absence of PH /symptoms do not rec rx  - HRCT 01/20/2018 1. Appearance of the lungs is compatible with interstitial lung disease. CT pattern is considered indeterminate for usual interstitial pneumonia (UIP). 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 - 01/24/2018  Walked RA x 3 laps @ 185 ft each stopped due to  End of study,fast  pace, no sob or desat    - 6 mw 07/31/2018   378 mild sob no desats - 05/08/2019   Walked RA  2 laps @  approx 244f each @ avg pace  stopped due to  End of study min sob, no cp, sats 94% - PFT's  05/08/2019  FVC  3.74 (89%) prior to study with DLCO  16.92 (67%) corrects to 3.37 (85%)  for alv volume and FV curve s convexity    - Labs 05/08/19  :   Neg collagen vasc profile with esr 7,  HSP serology neg  - HRCT 06/01/19 :  Findings are not significantly changed compared to prior examinations dating back to 04/21/2016 remain non diagnostic  - 11/12/2019   Walked RA x two laps =  approx 5040f@ moderate pace - stopped due to end of study, no sob with sats of 96 % at the end of the study.   No evidence of dz progression, no additional w/u for now but strongly rec as before he check his sats at highest levels of exertion at least  once a week  Also: Pt informed of the seriousness of COVID 19 infection as a direct risk to lung health  and safey and to close contacts and should continue to wear a facemask in public and minimize exposure to public locations but especially avoid any area or activity where non-close contacts are not observing distancing or wearing an appropriate face mask.  I strongly recommended vaccine when offered.    >>> f/u in 8 m, call sooner if needed          Each maintenance medication was reviewed in detail including emphasizing most importantly the difference between maintenance and prns and under what circumstances the prns are to be triggered using an action plan format where appropriate.  Total time for H and P, chart review, counseling,  directly observing portions of ambulatory 02 saturation study/ and generating customized AVS unique to this office visit / charting = 20 min

## 2019-11-29 ENCOUNTER — Other Ambulatory Visit: Payer: Self-pay | Admitting: Gastroenterology

## 2019-12-04 ENCOUNTER — Other Ambulatory Visit: Payer: Self-pay | Admitting: Cardiology

## 2019-12-11 DIAGNOSIS — E785 Hyperlipidemia, unspecified: Secondary | ICD-10-CM | POA: Diagnosis not present

## 2019-12-11 DIAGNOSIS — E559 Vitamin D deficiency, unspecified: Secondary | ICD-10-CM | POA: Diagnosis not present

## 2019-12-11 DIAGNOSIS — D649 Anemia, unspecified: Secondary | ICD-10-CM | POA: Diagnosis not present

## 2019-12-11 DIAGNOSIS — R7309 Other abnormal glucose: Secondary | ICD-10-CM | POA: Diagnosis not present

## 2019-12-14 DIAGNOSIS — Z0001 Encounter for general adult medical examination with abnormal findings: Secondary | ICD-10-CM | POA: Diagnosis not present

## 2019-12-14 DIAGNOSIS — J849 Interstitial pulmonary disease, unspecified: Secondary | ICD-10-CM | POA: Diagnosis not present

## 2019-12-14 DIAGNOSIS — I2510 Atherosclerotic heart disease of native coronary artery without angina pectoris: Secondary | ICD-10-CM | POA: Diagnosis not present

## 2019-12-14 DIAGNOSIS — K2100 Gastro-esophageal reflux disease with esophagitis, without bleeding: Secondary | ICD-10-CM | POA: Diagnosis not present

## 2020-01-07 ENCOUNTER — Other Ambulatory Visit: Payer: Self-pay | Admitting: Gastroenterology

## 2020-02-07 DIAGNOSIS — U071 COVID-19: Secondary | ICD-10-CM | POA: Diagnosis not present

## 2020-02-07 DIAGNOSIS — Z03818 Encounter for observation for suspected exposure to other biological agents ruled out: Secondary | ICD-10-CM | POA: Diagnosis not present

## 2020-02-12 ENCOUNTER — Ambulatory Visit: Payer: Medicare Other | Admitting: Gastroenterology

## 2020-02-19 ENCOUNTER — Other Ambulatory Visit: Payer: Self-pay | Admitting: Gastroenterology

## 2020-02-20 ENCOUNTER — Other Ambulatory Visit: Payer: Self-pay | Admitting: Gastroenterology

## 2020-02-25 ENCOUNTER — Other Ambulatory Visit: Payer: Self-pay | Admitting: Cardiology

## 2020-03-04 ENCOUNTER — Other Ambulatory Visit: Payer: Self-pay | Admitting: Cardiology

## 2020-03-27 ENCOUNTER — Encounter (INDEPENDENT_AMBULATORY_CARE_PROVIDER_SITE_OTHER): Payer: Self-pay | Admitting: Otolaryngology

## 2020-03-27 ENCOUNTER — Other Ambulatory Visit: Payer: Self-pay

## 2020-03-27 ENCOUNTER — Ambulatory Visit (INDEPENDENT_AMBULATORY_CARE_PROVIDER_SITE_OTHER): Payer: Medicare Other | Admitting: Otolaryngology

## 2020-03-27 VITALS — Temp 97.7°F

## 2020-03-27 DIAGNOSIS — H5203 Hypermetropia, bilateral: Secondary | ICD-10-CM | POA: Diagnosis not present

## 2020-03-27 DIAGNOSIS — H25813 Combined forms of age-related cataract, bilateral: Secondary | ICD-10-CM | POA: Diagnosis not present

## 2020-03-27 DIAGNOSIS — H6121 Impacted cerumen, right ear: Secondary | ICD-10-CM | POA: Diagnosis not present

## 2020-03-27 DIAGNOSIS — H52203 Unspecified astigmatism, bilateral: Secondary | ICD-10-CM | POA: Diagnosis not present

## 2020-03-27 NOTE — Progress Notes (Signed)
HPI: Troy Sanchez is a 79 y.o. male who presents for evaluation of of his ears.  He has previously seen Dr. Ernesto Rutherford in the past.  He wears bilateral hearing aids.  His left hearing aid is being worked on.  He has intermittent sensation of fluid in the right ear..  Past Medical History:  Diagnosis Date  . CAD (coronary artery disease) 07/02/2019  . Hiatal hernia   . HTN (hypertension) 07/02/2019  . Hypertension    Past Surgical History:  Procedure Laterality Date  . CARDIAC CATHETERIZATION N/A 05/18/2016   Procedure: Right/Left Heart Cath and Coronary Angiography;  Surgeon: Adrian Prows, MD;  Location: [facility];  Service: Cardiovascular;  Laterality: N/A;  . CHOLECYSTECTOMY  2006  . GASTROSTOMY N/A 03/06/2014   Procedure: GASTROSTOMY;  Surgeon: [name], MD;  Location: [facility];  Service: General;  Laterality: N/A;  . HIATAL HERNIA REPAIR N/A 03/06/2014   Procedure: HERNIA REPAIR HIATAL;  Surgeon: [name], MD;  Location: [facility];  Service: General;  Laterality: N/A;  . LAPAROTOMY N/A 03/06/2014   Procedure: EXPLORATORY LAPAROTOMY;  Surgeon: [name], MD;  Location: [facility];  Service: General;  Laterality: N/A;  . TRANSURETHRAL RESECTION OF PROSTATE N/A 08/15/2014   Procedure: TRANSURETHRAL RESECTION OF THE PROSTATE WITH GYRUS INSTRUMENTS;  Surgeon: Malka So, MD;  Location: [facility];  Service: Urology;  Laterality: N/A;  . UMBILICAL HERNIA REPAIR     Social History   Socioeconomic History  . Marital status: Married    Spouse name: Not on file  . Number of children: 2  . Years of education: Not on file  . Highest education level: Not on file  Occupational History  . Not on file  Tobacco Use  . Smoking status: Never Smoker  . Smokeless tobacco: Never Used  Vaping Use  . Vaping Use: Never used  Substance and Sexual Activity  . Alcohol use: Yes    Comment: ocassionally  . Drug use: No  . Sexual activity: Never  Other Topics Concern  . Not on file  Social History  Narrative  . Not on file   Social Determinants of Health   Financial Resource Strain:   . Difficulty of Paying Living Expenses:   Food Insecurity:   . Worried About Charity fundraiser in the Last Year:   . Arboriculturist in the Last Year:   Transportation Needs:   . Film/video editor (Medical):   Marland Kitchen Lack of Transportation (Non-Medical):   Physical Activity:   . Days of Exercise per Week:   . Minutes of Exercise per Session:   Stress:   . Feeling of Stress :   Social Connections:   . Frequency of Communication with Friends and Family:   . Frequency of Social Gatherings with Friends and Family:   . Attends Religious Services:   . Active Member of Clubs or Organizations:   . Attends Archivist Meetings:   Marland Kitchen Marital Status:    No family history on file. No Known Allergies Prior to Admission medications   Medication Sig Start Date End Date Taking? Authorizing Provider  amLODipine (NORVASC) 5 MG tablet TAKE 1 TABLET BY MOUTH EVERY DAY 03/04/20  Yes Adrian Prows, MD  aspirin 81 MG chewable tablet Chew 81 mg by mouth every other day.    Yes [provider]  atorvastatin (LIPITOR) 10 MG tablet TAKE 1 TABLET BY MOUTH EVERY DAY 02/25/20  Yes Adrian Prows,  MD  cholecalciferol (VITAMIN D) 1000 UNITS tablet Take 1,000 Units by mouth every morning.    Yes [provider]  isosorbide mononitrate (IMDUR) 60 MG 24 hr tablet TAKE 1 TABLET BY MOUTH EVERY DAY 09/10/19  Yes Adrian Prows, MD  Multiple Vitamins-Minerals (MULTIVITAMIN WITH MINERALS) tablet Take 1 tablet by mouth every morning.    Yes [provider]  omeprazole (PRILOSEC) 40 MG capsule TAKE 1 CAPSULE BY MOUTH 2 TIMES DAILY BEFORE A MEAL FOR 4 WEEKS THEN DECREASE TO ONE A DAY 02/21/20  Yes Danis, Estill Cotta III, MD     Positive ROS: Otherwise negative  All other systems have been reviewed and were otherwise negative with the exception of those mentioned in the HPI and as above.  Physical  Exam: Constitutional: Alert, well-appearing, no acute distress Ears: External ears without lesions or tenderness. Ear canals he had minimal wax buildup in the right side that was cleaned with forceps and suction.  The TM was clear with no middle ear effusion noted.  Left ear canal had very minimal wax and nothing was done on the left side.. Nasal: External nose without lesions. Clear nasal passages bilaterally with mild rhinitis no signs of infection Oral: Oropharynx clear.  Patient status post tonsillectomy with clear oropharynx. Neck: No palpable adenopathy or masses Respiratory: Breathing comfortably  Skin: No facial/neck lesions or rash noted.  Cerumen impaction removal  Date/Time: 03/27/2020 4:29 PM Performed by: Rozetta Nunnery, MD Authorized by: Rozetta Nunnery, MD   Consent:    Consent obtained:  Verbal   Consent given by:  Patient   Risks discussed:  Pain and bleeding Procedure details:    Location:  R ear   Procedure type: suction and forceps   Post-procedure details:    Inspection:  TM intact and canal normal   Hearing quality:  Improved   Patient tolerance of procedure:  Tolerated well, no immediate complications Comments:     TMs are clear bilaterally    Assessment: Wax buildup on the right side with otherwise normal exam  Plan: He will follow-up as needed  Radene Journey, MD

## 2020-05-11 ENCOUNTER — Other Ambulatory Visit: Payer: Self-pay | Admitting: Cardiology

## 2020-05-11 DIAGNOSIS — I10 Essential (primary) hypertension: Secondary | ICD-10-CM

## 2020-05-15 DIAGNOSIS — Z20822 Contact with and (suspected) exposure to covid-19: Secondary | ICD-10-CM | POA: Diagnosis not present

## 2020-06-06 DIAGNOSIS — R05 Cough: Secondary | ICD-10-CM | POA: Diagnosis not present

## 2020-06-06 DIAGNOSIS — R509 Fever, unspecified: Secondary | ICD-10-CM | POA: Diagnosis not present

## 2020-06-06 DIAGNOSIS — R197 Diarrhea, unspecified: Secondary | ICD-10-CM | POA: Diagnosis not present

## 2020-06-10 DIAGNOSIS — R5383 Other fatigue: Secondary | ICD-10-CM | POA: Diagnosis not present

## 2020-06-10 DIAGNOSIS — R531 Weakness: Secondary | ICD-10-CM | POA: Diagnosis not present

## 2020-06-10 DIAGNOSIS — R197 Diarrhea, unspecified: Secondary | ICD-10-CM | POA: Diagnosis not present

## 2020-06-24 ENCOUNTER — Inpatient Hospital Stay (HOSPITAL_COMMUNITY)
Admission: EM | Admit: 2020-06-24 | Discharge: 2020-07-04 | DRG: 872 | Disposition: A | Discharge status: Home / Self Care | Payer: Medicare Other | Attending: Internal Medicine | Admitting: Internal Medicine

## 2020-06-24 ENCOUNTER — Encounter (HOSPITAL_COMMUNITY): Payer: Self-pay | Admitting: *Deleted

## 2020-06-24 ENCOUNTER — Other Ambulatory Visit: Payer: Self-pay

## 2020-06-24 ENCOUNTER — Emergency Department (HOSPITAL_COMMUNITY): Payer: Medicare Other

## 2020-06-24 DIAGNOSIS — A414 Sepsis due to anaerobes: Secondary | ICD-10-CM | POA: Diagnosis not present

## 2020-06-24 DIAGNOSIS — R5381 Other malaise: Secondary | ICD-10-CM | POA: Diagnosis not present

## 2020-06-24 DIAGNOSIS — E876 Hypokalemia: Secondary | ICD-10-CM | POA: Diagnosis not present

## 2020-06-24 DIAGNOSIS — R531 Weakness: Secondary | ICD-10-CM

## 2020-06-24 DIAGNOSIS — Z20822 Contact with and (suspected) exposure to covid-19: Secondary | ICD-10-CM | POA: Diagnosis present

## 2020-06-24 DIAGNOSIS — I10 Essential (primary) hypertension: Secondary | ICD-10-CM | POA: Diagnosis not present

## 2020-06-24 DIAGNOSIS — Z6827 Body mass index (BMI) 27.0-27.9, adult: Secondary | ICD-10-CM | POA: Diagnosis not present

## 2020-06-24 DIAGNOSIS — D72829 Elevated white blood cell count, unspecified: Secondary | ICD-10-CM

## 2020-06-24 DIAGNOSIS — N5089 Other specified disorders of the male genital organs: Secondary | ICD-10-CM | POA: Diagnosis present

## 2020-06-24 DIAGNOSIS — K219 Gastro-esophageal reflux disease without esophagitis: Secondary | ICD-10-CM | POA: Diagnosis present

## 2020-06-24 DIAGNOSIS — R197 Diarrhea, unspecified: Secondary | ICD-10-CM

## 2020-06-24 DIAGNOSIS — K529 Noninfective gastroenteritis and colitis, unspecified: Secondary | ICD-10-CM | POA: Diagnosis not present

## 2020-06-24 DIAGNOSIS — A419 Sepsis, unspecified organism: Secondary | ICD-10-CM | POA: Diagnosis not present

## 2020-06-24 DIAGNOSIS — I272 Pulmonary hypertension, unspecified: Secondary | ICD-10-CM | POA: Diagnosis not present

## 2020-06-24 DIAGNOSIS — E785 Hyperlipidemia, unspecified: Secondary | ICD-10-CM | POA: Diagnosis present

## 2020-06-24 DIAGNOSIS — I2510 Atherosclerotic heart disease of native coronary artery without angina pectoris: Secondary | ICD-10-CM | POA: Diagnosis present

## 2020-06-24 DIAGNOSIS — R17 Unspecified jaundice: Secondary | ICD-10-CM | POA: Diagnosis not present

## 2020-06-24 DIAGNOSIS — A0472 Enterocolitis due to Clostridium difficile, not specified as recurrent: Secondary | ICD-10-CM | POA: Diagnosis present

## 2020-06-24 DIAGNOSIS — I110 Hypertensive heart disease with heart failure: Secondary | ICD-10-CM | POA: Diagnosis not present

## 2020-06-24 DIAGNOSIS — N179 Acute kidney failure, unspecified: Secondary | ICD-10-CM | POA: Diagnosis present

## 2020-06-24 DIAGNOSIS — I5022 Chronic systolic (congestive) heart failure: Secondary | ICD-10-CM | POA: Diagnosis present

## 2020-06-24 DIAGNOSIS — E440 Moderate protein-calorie malnutrition: Secondary | ICD-10-CM | POA: Diagnosis not present

## 2020-06-24 DIAGNOSIS — Z79899 Other long term (current) drug therapy: Secondary | ICD-10-CM | POA: Diagnosis not present

## 2020-06-24 DIAGNOSIS — I5021 Acute systolic (congestive) heart failure: Secondary | ICD-10-CM | POA: Diagnosis not present

## 2020-06-24 DIAGNOSIS — Z7982 Long term (current) use of aspirin: Secondary | ICD-10-CM | POA: Diagnosis not present

## 2020-06-24 DIAGNOSIS — E860 Dehydration: Secondary | ICD-10-CM | POA: Diagnosis not present

## 2020-06-24 DIAGNOSIS — R432 Parageusia: Secondary | ICD-10-CM | POA: Diagnosis not present

## 2020-06-24 DIAGNOSIS — R609 Edema, unspecified: Secondary | ICD-10-CM | POA: Diagnosis not present

## 2020-06-24 DIAGNOSIS — D509 Iron deficiency anemia, unspecified: Secondary | ICD-10-CM

## 2020-06-24 DIAGNOSIS — R109 Unspecified abdominal pain: Secondary | ICD-10-CM | POA: Diagnosis not present

## 2020-06-24 DIAGNOSIS — R509 Fever, unspecified: Secondary | ICD-10-CM | POA: Diagnosis not present

## 2020-06-24 LAB — COMPREHENSIVE METABOLIC PANEL
ALT: 20 U/L (ref 0–44)
AST: 24 U/L (ref 15–41)
Albumin: 2.7 g/dL — ABNORMAL LOW (ref 3.5–5.0)
Alkaline Phosphatase: 77 U/L (ref 38–126)
Anion gap: 10 (ref 5–15)
BUN: 18 mg/dL (ref 8–23)
CO2: 24 mmol/L (ref 22–32)
Calcium: 8.2 mg/dL — ABNORMAL LOW (ref 8.9–10.3)
Chloride: 99 mmol/L (ref 98–111)
Creatinine, Ser: 1.29 mg/dL — ABNORMAL HIGH (ref 0.61–1.24)
GFR calc Af Amer: >60 mL/min (ref >60)
GFR calc non Af Amer: 53 mL/min — ABNORMAL LOW (ref >60)
Glucose, Bld: 133 mg/dL — ABNORMAL HIGH (ref 70–99)
Potassium: 3.6 mmol/L (ref 3.5–5.1)
Sodium: 133 mmol/L — ABNORMAL LOW (ref 135–145)
Total Bilirubin: 2 mg/dL — ABNORMAL HIGH (ref 0.3–1.2)
Total Protein: 6.1 g/dL — ABNORMAL LOW (ref 6.5–8.1)

## 2020-06-24 LAB — CBC
HCT: 38.3 % — ABNORMAL LOW (ref 39.0–52.0)
Hemoglobin: 11.8 g/dL — ABNORMAL LOW (ref 13.0–17.0)
MCH: 23.9 pg — ABNORMAL LOW (ref 26.0–34.0)
MCHC: 30.8 g/dL (ref 30.0–36.0)
MCV: 77.5 fL — ABNORMAL LOW (ref 80.0–100.0)
Platelets: 325 10*3/uL (ref 150–400)
RBC: 4.94 MIL/uL (ref 4.22–5.81)
RDW: 16.3 % — ABNORMAL HIGH (ref 11.5–15.5)
WBC: 22.3 10*3/uL — ABNORMAL HIGH (ref 4.0–10.5)
nRBC: 0 % (ref 0.0–0.2)

## 2020-06-24 LAB — C DIFFICILE QUICK SCREEN W PCR REFLEX
C Diff antigen: POSITIVE — AB
C Diff interpretation: DETECTED
C Diff toxin: POSITIVE — AB

## 2020-06-24 LAB — LIPASE, BLOOD: Lipase: 21 U/L (ref 11–51)

## 2020-06-24 LAB — SARS CORONAVIRUS 2 BY RT PCR (HOSPITAL ORDER, PERFORMED IN ~~LOC~~ HOSPITAL LAB): SARS Coronavirus 2: NEGATIVE

## 2020-06-24 MED ORDER — ADULT MULTIVITAMIN W/MINERALS CH
1.0000 | ORAL_TABLET | Freq: Every day | ORAL | Status: DC
  Administered 2020-06-25 – 2020-07-04 (×10): 1 via ORAL
  Filled 2020-06-24 (×10): qty 1

## 2020-06-24 MED ORDER — IOHEXOL 300 MG/ML  SOLN
100.0000 mL | Freq: Once | INTRAMUSCULAR | Status: AC | PRN
  Administered 2020-06-24: 100 mL via INTRAVENOUS

## 2020-06-24 MED ORDER — ACETAMINOPHEN 500 MG PO TABS
1000.0000 mg | ORAL_TABLET | Freq: Once | ORAL | Status: AC
  Administered 2020-06-24: 1000 mg via ORAL
  Filled 2020-06-24: qty 2

## 2020-06-24 MED ORDER — AMLODIPINE BESYLATE 5 MG PO TABS
5.0000 mg | ORAL_TABLET | Freq: Every day | ORAL | Status: DC
  Filled 2020-06-24: qty 1

## 2020-06-24 MED ORDER — HEPARIN SODIUM (PORCINE) 5000 UNIT/ML IJ SOLN
5000.0000 [IU] | Freq: Three times a day (TID) | INTRAMUSCULAR | Status: DC
  Administered 2020-06-24 – 2020-07-04 (×28): 5000 [IU] via SUBCUTANEOUS
  Filled 2020-06-24 (×28): qty 1

## 2020-06-24 MED ORDER — SODIUM CHLORIDE 0.9 % IV SOLN: 2.0000 g | INTRAVENOUS | Status: DC

## 2020-06-24 MED ORDER — SODIUM CHLORIDE 0.9 % IV SOLN
2.0000 g | Freq: Once | INTRAVENOUS | Status: AC
  Administered 2020-06-24: 2 g via INTRAVENOUS
  Filled 2020-06-24: qty 20

## 2020-06-24 MED ORDER — ONDANSETRON HCL 4 MG/2ML IJ SOLN
4.0000 mg | Freq: Once | INTRAMUSCULAR | Status: AC
  Administered 2020-06-24: 4 mg via INTRAVENOUS
  Filled 2020-06-24: qty 2

## 2020-06-24 MED ORDER — ATORVASTATIN CALCIUM 10 MG PO TABS
10.0000 mg | ORAL_TABLET | Freq: Every day | ORAL | Status: DC
  Filled 2020-06-24: qty 1

## 2020-06-24 MED ORDER — METRONIDAZOLE IN NACL 5-0.79 MG/ML-% IV SOLN: 500.0000 mg | Freq: Three times a day (TID) | INTRAVENOUS | Status: DC

## 2020-06-24 MED ORDER — PANTOPRAZOLE SODIUM 40 MG PO TBEC
40.0000 mg | DELAYED_RELEASE_TABLET | Freq: Every day | ORAL | Status: DC
  Administered 2020-06-24 – 2020-07-03 (×10): 40 mg via ORAL
  Filled 2020-06-24 (×10): qty 1

## 2020-06-24 MED ORDER — SODIUM CHLORIDE 0.9 % IV BOLUS
1000.0000 mL | Freq: Once | INTRAVENOUS | Status: AC
  Administered 2020-06-24: 1000 mL via INTRAVENOUS

## 2020-06-24 MED ORDER — ACETAMINOPHEN 325 MG PO TABS
650.0000 mg | ORAL_TABLET | Freq: Four times a day (QID) | ORAL | Status: DC | PRN
  Administered 2020-06-25: 650 mg via ORAL
  Filled 2020-06-24: qty 2

## 2020-06-24 MED ORDER — SODIUM CHLORIDE 0.9 % IV BOLUS (SEPSIS)
2000.0000 mL | Freq: Once | INTRAVENOUS | Status: AC
  Administered 2020-06-24: 2000 mL via INTRAVENOUS

## 2020-06-24 MED ORDER — KCL IN DEXTROSE-NACL 40-5-0.9 MEQ/L-%-% IV SOLN
INTRAVENOUS | Status: AC
  Filled 2020-06-24: qty 1000

## 2020-06-24 MED ORDER — ACETAMINOPHEN 650 MG RE SUPP: 650.0000 mg | Freq: Four times a day (QID) | RECTAL | Status: DC | PRN

## 2020-06-24 MED ORDER — METRONIDAZOLE IN NACL 5-0.79 MG/ML-% IV SOLN
500.0000 mg | Freq: Once | INTRAVENOUS | Status: AC
  Administered 2020-06-24: 500 mg via INTRAVENOUS
  Filled 2020-06-24: qty 100

## 2020-06-24 MED ORDER — VITAMIN D 25 MCG (1000 UNIT) PO TABS
1000.0000 [IU] | ORAL_TABLET | ORAL | Status: DC
  Administered 2020-06-25 – 2020-07-04 (×11): 1000 [IU] via ORAL
  Filled 2020-06-24 (×14): qty 1

## 2020-06-24 MED ORDER — ISOSORBIDE MONONITRATE ER 60 MG PO TB24
60.0000 mg | ORAL_TABLET | Freq: Every day | ORAL | Status: DC
  Filled 2020-06-24 (×2): qty 1

## 2020-06-24 MED ORDER — VANCOMYCIN 50 MG/ML ORAL SOLUTION
125.0000 mg | Freq: Four times a day (QID) | ORAL | Status: DC
  Administered 2020-06-25 – 2020-07-03 (×36): 125 mg via ORAL
  Filled 2020-06-24 (×41): qty 2.5

## 2020-06-24 MED ORDER — ASPIRIN 81 MG PO CHEW
81.0000 mg | CHEWABLE_TABLET | ORAL | Status: DC
  Administered 2020-06-24 – 2020-07-04 (×7): 81 mg via ORAL
  Filled 2020-06-24 (×8): qty 1

## 2020-06-24 MED ORDER — SODIUM CHLORIDE 0.9 % IV SOLN
INTRAVENOUS | Status: DC
  Administered 2020-06-25: 100 mL/h via INTRAVENOUS

## 2020-06-24 NOTE — H&P (Signed)
History and Physical  Troy Sanchez MHD:[id number] DOB: December 15, 1940 DOA: 06/24/2020  Referring physician: Reubin Milan MD PCP: Deland Pretty, MD  Patient coming from: Home   Chief Complaint: Diarrhea and weakness   HPI: Troy Sanchez is a 79 y.o. male with medical history significant for pulmonary hypertension, hypertension, CAD, hyperlipidemia and GERD who presents to the emergency department due to 3-week onset of diarrhea and generalized weakness.  Patient states that he has been to urgent care twice prior to returning today, he states that he was prescribed an antibiotic due to the diarrhea when he first went around August 28th (he was unable to remember the name of antibiotic) and there was no improvement in diarrhea despite the antibiotic.  Diarrhea was watery in nature and was 4-6 episodes daily, he endorsed loss of sense of taste shortly after onset of diarrhea and he also complained of fever.  Patient states that he has been taking over-the-counter antidiarrheal medication (he did not remember the name of medication) without improvement.  He returned to urgent care today due to persistence of symptoms and increased weakness and he was asked to go to the ED for further evaluation and management.  Patient denies abdominal pain, nausea, vomiting, chest pain, shortness of breath, he also denies any sick contact.  ED Course:  In the emergency department, he was noted to be febrile and initially tachycardic.  Work-up in the ED showed leukocytosis, macrocytic anemia, mild hyponatremia, total bilirubin 2.0, SARS coronavirus 2 was negative.  CT abdomen and pelvis with contrast was consistent with diffuse colitis.  IV hydration was provided, patient was started on IV antibiotics.  Hospitalist was asked to admit patient for further evaluation and management.  Review of Systems: Constitutional: Positive for fever.  Negative for chills.  HENT: Negative for ear pain and sore throat.   Eyes: Negative for  pain and visual disturbance.  Respiratory: Negative for cough, chest tightness and shortness of breath.   Cardiovascular: Negative for chest pain and palpitations.  Gastrointestinal: Positive for diarrhea.  Negative for abdominal pain and vomiting.  Endocrine: Negative for polyphagia and polyuria.  Genitourinary: Negative for decreased urine volume, dysuria Musculoskeletal: Negative for arthralgias and back pain.  Skin: Negative for color change and rash.  Allergic/Immunologic: Negative for immunocompromised state.  Neurological: Positive for generalized weakness.  Negative for tremors, syncope, speech difficulty, light-headedness and headaches.  Hematological: Does not bruise/bleed easily.  All other systems reviewed and are negative   Past Medical History:  Diagnosis Date  . CAD (coronary artery disease) 07/02/2019  . Hiatal hernia   . HTN (hypertension) 07/02/2019  . Hypertension    Past Surgical History:  Procedure Laterality Date  . CARDIAC CATHETERIZATION N/A 05/18/2016   Procedure: Right/Left Heart Cath and Coronary Angiography;  Surgeon: Adrian Prows, MD;  Location: [facility];  Service: Cardiovascular;  Laterality: N/A;  . CHOLECYSTECTOMY  2006  . GASTROSTOMY N/A 03/06/2014   Procedure: GASTROSTOMY;  Surgeon: [name], MD;  Location: [facility];  Service: General;  Laterality: N/A;  . HIATAL HERNIA REPAIR N/A 03/06/2014   Procedure: HERNIA REPAIR HIATAL;  Surgeon: [name], MD;  Location: [place];  Service: General;  Laterality: N/A;  . LAPAROTOMY N/A 03/06/2014   Procedure: EXPLORATORY LAPAROTOMY;  Surgeon: [name], MD;  Location: [facility];  Service: General;  Laterality: N/A;  . TRANSURETHRAL RESECTION OF PROSTATE N/A 08/15/2014   Procedure: TRANSURETHRAL RESECTION OF THE PROSTATE WITH GYRUS INSTRUMENTS;  Surgeon: Malka So,  MD;  Location: [facility];  Service: Urology;  Laterality: N/A;  . UMBILICAL HERNIA REPAIR      Social History:  reports that he has never  smoked. He has never used smokeless tobacco. He reports current alcohol use. He reports that he does not use drugs.   No Known Allergies  No family history on file.    Prior to Admission medications   Medication Sig Start Date End Date Taking? Authorizing Provider  amLODipine (NORVASC) 5 MG tablet TAKE 1 TABLET BY MOUTH EVERY DAY 03/04/20   Adrian Prows, MD  aspirin 81 MG chewable tablet Chew 81 mg by mouth every other day.     [provider]  atorvastatin (LIPITOR) 10 MG tablet TAKE 1 TABLET BY MOUTH EVERY DAY 02/25/20   Adrian Prows, MD  cholecalciferol (VITAMIN D) 1000 UNITS tablet Take 1,000 Units by mouth every morning.     [provider]  isosorbide mononitrate (IMDUR) 60 MG 24 hr tablet TAKE 1 TABLET BY MOUTH EVERY DAY 05/12/20   Adrian Prows, MD  Multiple Vitamins-Minerals (MULTIVITAMIN WITH MINERALS) tablet Take 1 tablet by mouth every morning.     [provider]  omeprazole (PRILOSEC) 40 MG capsule TAKE 1 CAPSULE BY MOUTH 2 TIMES DAILY BEFORE A MEAL FOR 4 WEEKS THEN DECREASE TO ONE A DAY 02/21/20   Doran Stabler, MD    Physical Exam: BP 122/67 (BP Location: Right Arm)   Pulse 95   Temp (!) 101.2 F (38.4 C) (Oral)   Resp 16   SpO2 95%   . General: 79 y.o. year-old male well developed well nourished in no acute distress.  Alert and oriented x3.  Marland Kitchen HEENT: Dry mucous membrane, EOMI, NCAT . Neck: Supple, trachea midline . Cardiovascular: Regular rate and rhythm with no rubs or gallops.  No thyromegaly or JVD noted.  No lower extremity edema. 2/4 pulses in all 4 extremities. Marland Kitchen Respiratory: Clear to auscultation with no wheezes or rales. Good inspiratory effort. . Abdomen: Soft nontender nondistended with normal bowel sounds x4 quadrants. . Muskuloskeletal: No cyanosis, clubbing or edema noted bilaterally . Neuro: CN II-XII intact, strength, sensation, reflexes . Skin: No ulcerative lesions noted or rashes . Psychiatry: Judgement and insight appear  normal. Mood is appropriate for condition and setting          Labs on Admission:  Basic Metabolic Panel: Recent Labs  Lab 06/24/20 1607  NA 133*  K 3.6  CL 99  CO2 24  GLUCOSE 133*  BUN 18  CREATININE 1.29*  CALCIUM 8.2*   Liver Function Tests: Recent Labs  Lab 06/24/20 1607  AST 24  ALT 20  ALKPHOS 77  BILITOT 2.0*  PROT 6.1*  ALBUMIN 2.7*   Recent Labs  Lab 06/24/20 1607  LIPASE 21   No results for input(s): AMMONIA in the last 168 hours. CBC: Recent Labs  Lab 06/24/20 1607  WBC 22.3*  HGB 11.8*  HCT 38.3*  MCV 77.5*  PLT 325   Cardiac Enzymes: No results for input(s): CKTOTAL, CKMB, CKMBINDEX, TROPONINI in the last 168 hours.  BNP (last 3 results) No results for input(s): BNP in the last 8760 hours.  ProBNP (last 3 results) No results for input(s): PROBNP in the last 8760 hours.  CBG: No results for input(s): GLUCAP in the last 168 hours.  Radiological Exams on Admission: CT ABDOMEN PELVIS W CONTRAST  Result Date: 06/24/2020 CLINICAL DATA:  Abdominal pain with leukocytosis and diarrhea EXAM: CT ABDOMEN AND  PELVIS WITH CONTRAST TECHNIQUE: Multidetector CT imaging of the abdomen and pelvis was performed using the standard protocol following bolus administration of intravenous contrast. CONTRAST:  126mL OMNIPAQUE IOHEXOL 300 MG/ML  SOLN COMPARISON:  October 03, 2014. FINDINGS: Lower chest: There is underlying fibrosis bilaterally. There is a small area of apparent loculated effusion in the right base region. There are multiple foci of coronary artery calcification. There is thickening of the distal esophageal wall. Hepatobiliary: There is a cyst in the anterior segment of the right lobe of the liver measuring 0.7 x 0.6 cm. No other focal liver lesions are evident. The gallbladder is absent. There is intrahepatic biliary duct dilatation. The common hepatic duct is enlarged, measuring 13 mm. The common bile duct tapers distally. No obstructing lesion is  seen in the biliary ductal system by CT. Pancreas: There is no pancreatic mass or inflammatory focus. Spleen: No splenic lesions are appreciable. Adrenals/Urinary Tract: Adrenals bilaterally appear normal. There is a cyst arising from the posterior upper pole of the left kidney measuring 1.2 x 1.2 cm. There is no appreciable hydronephrosis on either side. There is a 3 x 2 mm calculus in the lower pole of the left kidney. There is a 1 mm calculus in the mid right kidney. There is a 2 mm calculus in the upper pole of the right kidney. There is a 2 x 2 mm calculus in the upper pole of the left kidney. There is no appreciable ureteral calculus on either side. Urinary bladder is midline with wall thickness within normal limits. Stomach/Bowel: There is diffuse wall thickening throughout the entire:. There is patchy soft tissue stranding surrounding multiple loops of colon. No appreciable diverticular disease is evident. There is no colonic pneumatosis. There is no appreciable small bowel wall thickening or small bowel dilatation. No focal bowel obstruction is demonstrable on this study. The terminal ileum appears unremarkable. There is no appreciable free air or portal venous air. Slight fluid is seen surrounding several loops of bowel with fluid noted in the right and left lateral conal fascia regions. There is no free air or portal venous air. There is an uncomplicated diverticulum arising in the third portion of the duodenum measuring 1.3 x 1.0 cm. Note that there is a degree of colonic interposition between the liver and right hemidiaphragm. Bowel in this area of colonic interposition is diffusely inflamed. Vascular/Lymphatic: There is no abdominal aortic aneurysm. There are foci of aortic atherosclerosis in the aorta and iliac arteries. No appreciable mesenteric arterial vascular narrowing evident. Major venous structures are patent. There is no appreciable adenopathy in the abdomen or pelvis. Reproductive: Prostate  and seminal vesicles are normal in size and contour. There are occasional prostatic calculi. Other: Appendix appears normal. No evident abscess in the abdomen pelvis. Minimal ascites noted. There is evidence of midline abdominal wall soft tissue thickening, likely of postoperative etiology. There is rectus muscle thinning in this area. No bowel compromise seen in this area. There is evidence of fluid tracking into a small inguinal hernia on the right. No bowel extending into this area. Musculoskeletal: No blastic or lytic bone lesions. No intramuscular lesions are evident. IMPRESSION: 1. Diffuse colonic wall thickening throughout essentially the entire colon consistent with diffuse colitis. There is surrounding mesenteric thickening in areas of mild fluid tracking along the colon. No evident perforation. No appreciable diverticular disease. No pneumatosis. Etiology for the colitis uncertain. Infectious colitis could certainly present in this manner. No appreciable major mesenteric arterial vessel narrowing to suggest ischemic  etiology. 2. Essentially normal appearing small bowel. Terminal ileum appears normal. No bowel obstruction. 3.  Appendix appears normal.  No abscess in the abdomen or pelvis. 4. Nonobstructing small calculi in each kidney. No hydronephrosis or ureteral calculus on either side. Urinary bladder wall thickness normal. 5. Gallbladder absent. Intrahepatic and common hepatic bile duct dilatation noted. No obstructing focus in the biliary ductal system evident by CT. 6. Aortic Atherosclerosis (ICD10-I70.0). Foci of pelvic arterial vascular calcification as well as foci of coronary artery calcification noted. 7. Thickening of the distal esophageal wall. Question a degree of distal esophagitis. 8. Small amount ascites. A small amount of fluid tracks into a right inguinal hernia. Electronically Signed   By: Lowella Grip III M.D.   On: 06/24/2020 19:08    EKG: I independently viewed the EKG done  and my findings are as followed: Wide QRS rhythm at rate of around 1 bpm with right bundle branch block  Assessment/Plan Present on Admission: . Acute colitis . HTN (hypertension) . CAD (coronary artery disease)  Principal Problem:   Acute colitis Active Problems:   CAD (coronary artery disease)   HTN (hypertension)   Sepsis (HCC)   Diarrhea   Generalized weakness   Microcytic anemia   Leukocytosis   Serum total bilirubin elevated   Dehydration  Generalized weakness in the setting of sepsis possibly due to C. difficile colitis  Patient presents with fever and leukocytosis (meets SIRS criteria) and source of infection (intra-abdominal-colitis, diarrhea) thereby meeting sepsis criteria.  Lactic acid level will be checked CT abdomen and pelvis with contrast was consistent with diffuse colitis. Continue IV hydration per sepsis protocol He was empirically started on IV ceftriaxone and Flagyl. we shall continue with vancomycin at this time due to positive C. difficile. Blood culture pending  Leukocytosis possible secondary to above Continue treatment as per above  Dehydration Continue IV hydration  Microcytic anemia H/H 11.3/38.3, MCV 77.5 Iron studies will be done and patient will be treated accordingly  Elevated total bilirubin Total bilirubin 2.0, direct bilirubin will be checked  Hypertension/pulmonary pretension  Continue home meds when med rec is updated  CAD Continue aspirin   DVT prophylaxis: Heparin subcu  Code Status: Full dose  Family Communication: None at bedside  Disposition Plan:  Patient is from:                        home Anticipated DC to:                   home Anticipated DC date:               2-3 days Anticipated DC barriers:         Patient currently unstable for discharge at this time due to generalized weakness in the setting of sepsis possibly secondary to acute colitis/diarrhea  Consults called: None  Admission status:  Inpatient    Bernadette Hoit MD [facility]  If 7PM-7AM, please contact night-coverage [email or web address] Password [facility]  06/24/2020, 8:41 PM

## 2020-06-24 NOTE — ED Notes (Signed)
Pt verbalized he went to urgent care and they told him he was dehydrated and needed intravenous fluids.

## 2020-06-24 NOTE — ED Triage Notes (Signed)
Diarrhea for 3 weeks, c/o weakness

## 2020-06-25 LAB — CBC
HCT: 35.8 % — ABNORMAL LOW (ref 39.0–52.0)
Hemoglobin: 10.9 g/dL — ABNORMAL LOW (ref 13.0–17.0)
MCH: 23.4 pg — ABNORMAL LOW (ref 26.0–34.0)
MCHC: 30.4 g/dL (ref 30.0–36.0)
MCV: 76.8 fL — ABNORMAL LOW (ref 80.0–100.0)
Platelets: 295 10*3/uL (ref 150–400)
RBC: 4.66 MIL/uL (ref 4.22–5.81)
RDW: 16.3 % — ABNORMAL HIGH (ref 11.5–15.5)
WBC: 25.3 10*3/uL — ABNORMAL HIGH (ref 4.0–10.5)
nRBC: 0 % (ref 0.0–0.2)

## 2020-06-25 LAB — GASTROINTESTINAL PANEL BY PCR, STOOL (REPLACES STOOL CULTURE)

## 2020-06-25 LAB — COMPREHENSIVE METABOLIC PANEL
ALT: 17 U/L (ref 0–44)
AST: 21 U/L (ref 15–41)
Albumin: 2.1 g/dL — ABNORMAL LOW (ref 3.5–5.0)
Alkaline Phosphatase: 70 U/L (ref 38–126)
Anion gap: 8 (ref 5–15)
BUN: 17 mg/dL (ref 8–23)
CO2: 22 mmol/L (ref 22–32)
Calcium: 7.7 mg/dL — ABNORMAL LOW (ref 8.9–10.3)
Chloride: 105 mmol/L (ref 98–111)
Creatinine, Ser: 0.95 mg/dL (ref 0.61–1.24)
GFR calc Af Amer: >60 mL/min (ref >60)
GFR calc non Af Amer: >60 mL/min (ref >60)
Glucose, Bld: 138 mg/dL — ABNORMAL HIGH (ref 70–99)
Potassium: 3.4 mmol/L — ABNORMAL LOW (ref 3.5–5.1)
Sodium: 135 mmol/L (ref 135–145)
Total Bilirubin: 1.3 mg/dL — ABNORMAL HIGH (ref 0.3–1.2)
Total Protein: 4.9 g/dL — ABNORMAL LOW (ref 6.5–8.1)

## 2020-06-25 LAB — PHOSPHORUS: Phosphorus: 2.6 mg/dL (ref 2.5–4.6)

## 2020-06-25 LAB — URINALYSIS, ROUTINE W REFLEX MICROSCOPIC
Bilirubin Urine: NEGATIVE
Glucose, UA: NEGATIVE mg/dL
Hgb urine dipstick: NEGATIVE
Ketones, ur: NEGATIVE mg/dL
Leukocytes,Ua: NEGATIVE
Nitrite: NEGATIVE
Protein, ur: 30 mg/dL — AB
Specific Gravity, Urine: 1.035 — ABNORMAL HIGH (ref 1.005–1.030)
pH: 5 (ref 5.0–8.0)

## 2020-06-25 LAB — IRON AND TIBC
Iron: 7 ug/dL — ABNORMAL LOW (ref 45–182)
Saturation Ratios: 3 % — ABNORMAL LOW (ref 17.9–39.5)
TIBC: 225 ug/dL — ABNORMAL LOW (ref 250–450)
UIBC: 218 ug/dL

## 2020-06-25 LAB — FERRITIN: Ferritin: 165 ng/mL (ref 24–336)

## 2020-06-25 LAB — PROTIME-INR
INR: 1.5 — ABNORMAL HIGH (ref 0.8–1.2)
Prothrombin Time: 17.9 seconds — ABNORMAL HIGH (ref 11.4–15.2)

## 2020-06-25 LAB — BILIRUBIN, DIRECT: Bilirubin, Direct: 0.6 mg/dL — ABNORMAL HIGH (ref 0.0–0.2)

## 2020-06-25 LAB — CORTISOL-AM, BLOOD: Cortisol - AM: 39 ug/dL — ABNORMAL HIGH (ref 6.7–22.6)

## 2020-06-25 LAB — PROCALCITONIN: Procalcitonin: 6.43 ng/mL

## 2020-06-25 LAB — MAGNESIUM: Magnesium: 1.6 mg/dL — ABNORMAL LOW (ref 1.7–2.4)

## 2020-06-25 MED ORDER — POTASSIUM CHLORIDE CRYS ER 20 MEQ PO TBCR
40.0000 meq | EXTENDED_RELEASE_TABLET | Freq: Once | ORAL | Status: AC
  Administered 2020-06-25: 40 meq via ORAL
  Filled 2020-06-25: qty 2

## 2020-06-25 MED ORDER — MAGNESIUM SULFATE 2 GM/50ML IV SOLN
2.0000 g | Freq: Once | INTRAVENOUS | Status: AC
  Administered 2020-06-25: 2 g via INTRAVENOUS
  Filled 2020-06-25: qty 50

## 2020-06-25 MED ORDER — ONDANSETRON HCL 4 MG/2ML IJ SOLN: 4.0000 mg | Freq: Four times a day (QID) | INTRAMUSCULAR | Status: DC | PRN

## 2020-06-25 NOTE — ED Notes (Signed)
Denies blood in stool.

## 2020-06-25 NOTE — ED Notes (Signed)
Pt informed urine sample is needed on next visit to [facility].

## 2020-06-25 NOTE — ED Notes (Signed)
Ambulates to [facility] without difficulty.  Gait steady.

## 2020-06-25 NOTE — Progress Notes (Signed)
Pt arrived to room #326 via wheelchair from ED. Oriented to room and safety procedures. Educated on enteric precautions related to (+) C-diff results, states understanding. VSS. No c/o at present.

## 2020-06-25 NOTE — ED Provider Notes (Signed)
[facility] EMERGENCY DEPARTMENT Provider Note   CSN: [id number] Arrival date & time: 06/24/20  1255     History Chief Complaint  Patient presents with  . Diarrhea  . Fatigue    Troy Sanchez is a 79 y.o. male.  Presents ER with concern for diarrhea, fatigue.  Patient has had diarrhea for about 3 weeks.  On her release few weeks he has had worsening fatigue.  No abdominal pain, occasional nausea, not nauseous right now.  Had 6 loose stools this morning.  Not watery, not dark.  Denied antibiotic use prior to the symptoms starting.  Had gone to outside urgent care and was prescribed antibiotic orally.  Unsure which one.  CAD, hypertension    HPI     Past Medical History:  Diagnosis Date  . CAD (coronary artery disease) 07/02/2019  . Hiatal hernia   . HTN (hypertension) 07/02/2019  . Hypertension     Patient Active Problem List   Diagnosis Date Noted  . Acute colitis 06/24/2020  . Sepsis ([place]) 06/24/2020  . Diarrhea 06/24/2020  . Generalized weakness 06/24/2020  . Microcytic anemia 06/24/2020  . Leukocytosis 06/24/2020  . Serum total bilirubin elevated 06/24/2020  . Dehydration 06/24/2020  . CAD (coronary artery disease) 07/02/2019  . HTN (hypertension) 07/02/2019  . 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 Dyspnea on exertion 05/16/2016  . 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 Postop check 03/22/2014  . Partial small bowel obstruction ([place]) 03/03/2014  . History of cholecystectomy 03/03/2014  . Hiatal hernia 03/03/2014  . Bladder mass 03/03/2014    Past Surgical History:  Procedure Laterality Date  . CARDIAC CATHETERIZATION N/A 05/18/2016   Procedure: Right/Left Heart Cath and Coronary Angiography;  Surgeon: Adrian Prows, MD;  Location: [place] [facility];  Service: Cardiovascular;  Laterality: N/A;  . CHOLECYSTECTOMY  2006  . GASTROSTOMY N/A 03/06/2014   Procedure: GASTROSTOMY;  Surgeon: [name], MD;  Location: [place];  Service:  General;  Laterality: N/A;  . HIATAL HERNIA REPAIR N/A 03/06/2014   Procedure: HERNIA REPAIR HIATAL;  Surgeon: [name], MD;  Location: [place];  Service: General;  Laterality: N/A;  . LAPAROTOMY N/A 03/06/2014   Procedure: EXPLORATORY LAPAROTOMY;  Surgeon: [name], MD;  Location: [place];  Service: General;  Laterality: N/A;  . TRANSURETHRAL RESECTION OF PROSTATE N/A 08/15/2014   Procedure: TRANSURETHRAL RESECTION OF THE PROSTATE WITH GYRUS INSTRUMENTS;  Surgeon: Malka So, MD;  Location: [facility];  Service: Urology;  Laterality: N/A;  . UMBILICAL HERNIA REPAIR         No family history on file.  Social History   Tobacco Use  . Smoking status: Never Smoker  . Smokeless tobacco: Never Used  Vaping Use  . Vaping Use: Never used  Substance Use Topics  . Alcohol use: Yes    Comment: ocassionally  . Drug use: No    Home Medications Prior to Admission medications   Medication Sig Start Date End Date Taking? Authorizing Provider  amLODipine (NORVASC) 5 MG tablet Take 5 mg by mouth daily.   Yes [provider]  aspirin 81 MG chewable tablet Chew 81 mg by mouth every other day.    Yes [provider]  atorvastatin (LIPITOR) 10 MG tablet Take 10 mg by mouth daily.   Yes [provider]  cholecalciferol (VITAMIN D) 1000 UNITS tablet Take 1,000 Units by mouth every morning.    Yes [provider]  clopidogrel (PLAVIX)  75 MG tablet Take 75 mg by mouth daily.   Yes [provider]  isosorbide mononitrate (IMDUR) 60 MG 24 hr tablet Take 60 mg by mouth daily.   Yes [provider]  metoprolol succinate (TOPROL-XL) 50 MG 24 hr tablet Take 50 mg by mouth daily. Take with or immediately following a meal.   Yes [provider]  Multiple Vitamins-Minerals (MULTIVITAMIN WITH MINERALS) tablet Take 1 tablet by mouth every morning.    Yes [provider]  omeprazole (PRILOSEC) 40 MG capsule TAKE 1 CAPSULE BY MOUTH 2 TIMES  DAILY BEFORE A MEAL FOR 4 WEEKS THEN DECREASE TO ONE A DAY 02/21/20  Yes Danis, Estill Cotta III, MD  spironolactone (ALDACTONE) 25 MG tablet Take 25 mg by mouth daily.   Yes [provider]    Allergies    Patient has no known allergies.  Review of Systems   Review of Systems  Constitutional: Negative for chills and fever.  HENT: Negative for ear pain and sore throat.   Eyes: Negative for pain and visual disturbance.  Respiratory: Negative for cough and shortness of breath.   Cardiovascular: Negative for chest pain and palpitations.  Gastrointestinal: Positive for diarrhea. Negative for abdominal pain and vomiting.  Genitourinary: Negative for dysuria and hematuria.  Musculoskeletal: Negative for arthralgias and back pain.  Skin: Negative for color change and rash.  Neurological: Negative for seizures and syncope.  All other systems reviewed and are negative.   Physical Exam Updated Vital Signs BP 122/67 (BP Location: Right Arm)   Pulse 95   Temp (!) 101.2 F (38.4 C) (Oral)   Resp 16   SpO2 95%   Physical Exam Vitals and nursing note reviewed.  Constitutional:      Appearance: He is well-developed.  HENT:     Head: Normocephalic and atraumatic.  Eyes:     Conjunctiva/sclera: Conjunctivae normal.  Cardiovascular:     Rate and Rhythm: Normal rate and regular rhythm.     Heart sounds: No murmur heard.   Pulmonary:     Effort: Pulmonary effort is normal. No respiratory distress.     Breath sounds: Normal breath sounds.  Abdominal:     Palpations: Abdomen is soft.     Tenderness: There is no abdominal tenderness.  Musculoskeletal:        General: No deformity or signs of injury.     Cervical back: Neck supple.  Skin:    General: Skin is warm and dry.  Neurological:     General: No focal deficit present.     Mental Status: He is alert and oriented to person, place, and time.     ED Results / Procedures / Treatments   Labs (all labs ordered are listed, but  only abnormal results are displayed) Labs Reviewed  C DIFFICILE QUICK SCREEN W PCR REFLEX - Abnormal; Notable for the following components:      Result Value   C Diff antigen POSITIVE (*)    C Diff toxin POSITIVE (*)    All other components within normal limits  COMPREHENSIVE METABOLIC PANEL - Abnormal; Notable for the following components:   Sodium 133 (*)    Glucose, Bld 133 (*)    Creatinine, Ser 1.29 (*)    Calcium 8.2 (*)    Total Protein 6.1 (*)    Albumin 2.7 (*)    Total Bilirubin 2.0 (*)    GFR calc non Af Amer 53 (*)    All other components within normal  limits  CBC - Abnormal; Notable for the following components:   WBC 22.3 (*)    Hemoglobin 11.8 (*)    HCT 38.3 (*)    MCV 77.5 (*)    MCH 23.9 (*)    RDW 16.3 (*)    All other components within normal limits  SARS CORONAVIRUS 2 BY RT PCR (HOSPITAL ORDER, [place])  GASTROINTESTINAL PANEL BY PCR, STOOL (REPLACES STOOL CULTURE)  LIPASE, BLOOD  URINALYSIS, ROUTINE W REFLEX MICROSCOPIC  MAGNESIUM  PHOSPHORUS  PROTIME-INR  CORTISOL-AM, BLOOD  PROCALCITONIN  COMPREHENSIVE METABOLIC PANEL  CBC  IRON AND TIBC  FERRITIN  BILIRUBIN, DIRECT    EKG EKG Interpretation  Date/Time:  Tuesday June 24 2020 13:52:30 EDT Ventricular Rate:  101 PR Interval:    QRS Duration: 146 QT Interval:  364 QTC Calculation: 471 R Axis:   -124 Text Interpretation: Wide QRS rhythm Right bundle branch block Possible Lateral infarct , age undetermined Abnormal ECG Confirmed by Madalyn Rob [phone]) on 06/24/2020 3:57:40 PM   Radiology CT ABDOMEN PELVIS W CONTRAST  Result Date: 06/24/2020 CLINICAL DATA:  Abdominal pain with leukocytosis and diarrhea EXAM: CT ABDOMEN AND PELVIS WITH CONTRAST TECHNIQUE: Multidetector CT imaging of the abdomen and pelvis was performed using the standard protocol following bolus administration of intravenous contrast. CONTRAST:  128mL OMNIPAQUE IOHEXOL 300 MG/ML  SOLN  COMPARISON:  October 03, 2014. FINDINGS: Lower chest: There is underlying fibrosis bilaterally. There is a small area of apparent loculated effusion in the right base region. There are multiple foci of coronary artery calcification. There is thickening of the distal esophageal wall. Hepatobiliary: There is a cyst in the anterior segment of the right lobe of the liver measuring 0.7 x 0.6 cm. No other focal liver lesions are evident. The gallbladder is absent. There is intrahepatic biliary duct dilatation. The common hepatic duct is enlarged, measuring 13 mm. The common bile duct tapers distally. No obstructing lesion is seen in the biliary ductal system by CT. Pancreas: There is no pancreatic mass or inflammatory focus. Spleen: No splenic lesions are appreciable. Adrenals/Urinary Tract: Adrenals bilaterally appear normal. There is a cyst arising from the posterior upper pole of the left kidney measuring 1.2 x 1.2 cm. There is no appreciable hydronephrosis on either side. There is a 3 x 2 mm calculus in the lower pole of the left kidney. There is a 1 mm calculus in the mid right kidney. There is a 2 mm calculus in the upper pole of the right kidney. There is a 2 x 2 mm calculus in the upper pole of the left kidney. There is no appreciable ureteral calculus on either side. Urinary bladder is midline with wall thickness within normal limits. Stomach/Bowel: There is diffuse wall thickening throughout the entire:. There is patchy soft tissue stranding surrounding multiple loops of colon. No appreciable diverticular disease is evident. There is no colonic pneumatosis. There is no appreciable small bowel wall thickening or small bowel dilatation. No focal bowel obstruction is demonstrable on this study. The terminal ileum appears unremarkable. There is no appreciable free air or portal venous air. Slight fluid is seen surrounding several loops of bowel with fluid noted in the right and left lateral conal fascia regions.  There is no free air or portal venous air. There is an uncomplicated diverticulum arising in the third portion of the duodenum measuring 1.3 x 1.0 cm. Note that there is a degree of colonic interposition between the liver and right hemidiaphragm.  Bowel in this area of colonic interposition is diffusely inflamed. Vascular/Lymphatic: There is no abdominal aortic aneurysm. There are foci of aortic atherosclerosis in the aorta and iliac arteries. No appreciable mesenteric arterial vascular narrowing evident. Major venous structures are patent. There is no appreciable adenopathy in the abdomen or pelvis. Reproductive: Prostate and seminal vesicles are normal in size and contour. There are occasional prostatic calculi. Other: Appendix appears normal. No evident abscess in the abdomen pelvis. Minimal ascites noted. There is evidence of midline abdominal wall soft tissue thickening, likely of postoperative etiology. There is rectus muscle thinning in this area. No bowel compromise seen in this area. There is evidence of fluid tracking into a small inguinal hernia on the right. No bowel extending into this area. Musculoskeletal: No blastic or lytic bone lesions. No intramuscular lesions are evident. IMPRESSION: 1. Diffuse colonic wall thickening throughout essentially the entire colon consistent with diffuse colitis. There is surrounding mesenteric thickening in areas of mild fluid tracking along the colon. No evident perforation. No appreciable diverticular disease. No pneumatosis. Etiology for the colitis uncertain. Infectious colitis could certainly present in this manner. No appreciable major mesenteric arterial vessel narrowing to suggest ischemic etiology. 2. Essentially normal appearing small bowel. Terminal ileum appears normal. No bowel obstruction. 3.  Appendix appears normal.  No abscess in the abdomen or pelvis. 4. Nonobstructing small calculi in each kidney. No hydronephrosis or ureteral calculus on either side.  Urinary bladder wall thickness normal. 5. Gallbladder absent. Intrahepatic and common hepatic bile duct dilatation noted. No obstructing focus in the biliary ductal system evident by CT. 6. Aortic Atherosclerosis (ICD10-I70.0). Foci of pelvic arterial vascular calcification as well as foci of coronary artery calcification noted. 7. Thickening of the distal esophageal wall. Question a degree of distal esophagitis. 8. Small amount ascites. A small amount of fluid tracks into a right inguinal hernia. Electronically Signed   By: Lowella Grip III M.D.   On: 06/24/2020 19:08    Procedures Procedures (including critical care time)  Medications Ordered in ED Medications  0.9 %  sodium chloride infusion (has no administration in time range)  multivitamin with minerals tablet 1 tablet (has no administration in time range)  cholecalciferol (VITAMIN D3) tablet 1,000 Units (has no administration in time range)  aspirin chewable tablet 81 mg (81 mg Oral Given 06/24/20 2112)  pantoprazole (PROTONIX) EC tablet 40 mg (40 mg Oral Given 06/24/20 2112)  heparin injection 5,000 Units (5,000 Units Subcutaneous Given 06/24/20 2114)  acetaminophen (TYLENOL) tablet 650 mg (has no administration in time range)    Or  acetaminophen (TYLENOL) suppository 650 mg (has no administration in time range)  vancomycin (VANCOCIN) 50 mg/mL oral solution 125 mg (has no administration in time range)  ondansetron (ZOFRAN) injection 4 mg (4 mg Intravenous Given 06/24/20 1754)  acetaminophen (TYLENOL) tablet 1,000 mg (1,000 mg Oral Given 06/24/20 1748)  sodium chloride 0.9 % bolus 1,000 mL (0 mLs Intravenous Stopped 06/24/20 1855)  iohexol (OMNIPAQUE) 300 MG/ML solution 100 mL (100 mLs Intravenous Contrast Given 06/24/20 1842)  cefTRIAXone (ROCEPHIN) 2 g in sodium chloride 0.9 % 100 mL IVPB (0 g Intravenous Stopped 06/24/20 2320)    And  metroNIDAZOLE (FLAGYL) IVPB 500 mg (500 mg Intravenous New Bag/Given 06/24/20 2254)  sodium chloride  0.9 % bolus 2,000 mL (2,000 mLs Intravenous New Bag/Given 06/24/20 2114)    ED Course  I have reviewed the triage vital signs and the nursing notes.  Pertinent labs & imaging results that were available during  my care of the patient were reviewed by me and considered in my medical decision making (see chart for details).    MDM Rules/Calculators/A&P                         79 year old male presents to ER with concern for diarrhea.  Here patient was well-appearing but noted to have a fever to 101.  Work-up concerning for elevated WBCs, CT scan with significant colitis.  Given fever, leukocytosis, age, will start on IV antibiotics admit to the hospitalist service for further management.  Started Rocephin, Flagyl, fluids.  Ordered GI panel to further investiage but pt unable to provide sample at time of admission. If able should be sent to further investigate.    Dr. Josephine Cables accepting.  Final Clinical Impression(s) / ED Diagnoses Final diagnoses:  Colitis  Leukocytosis, unspecified type    Rx / DC Orders ED Discharge Orders    None       Lucrezia Starch, MD 06/25/20 0010

## 2020-06-25 NOTE — ED Notes (Signed)
CRITICAL VALUE ALERT  Critical Value:  C-Diff positive  Date & Time Notied:  06/24/2020 2253  Provider Notified: Dr. Josephine Cables  Orders Received/Actions taken: see chart

## 2020-06-25 NOTE — Progress Notes (Signed)
Received call from lab. Pt positive for Norovirus. Mid-level made aware via Amion.

## 2020-06-25 NOTE — Progress Notes (Signed)
PROGRESS NOTE  Troy Sanchez  DOB: January 14, 1941  PCP: Deland Pretty, MD MBW:[id number]  DOA: 06/24/2020  LOS: 1 day   Chief Complaint  Patient presents with  . Diarrhea  . Fatigue    Brief narrative: Troy Sanchez is a 79 y.o. male with medical history significant for pulmonary hypertension, hypertension, CAD, hyperlipidemia and GERD. Patient presented to the ED on 9/14 with 3-week history of diarrhea and progressive generalized weakness.  8/28, he went to an urgent care center and was prescribed antibiotic which did not help.  He tried over-the-counter antidiarrheals as well without much help.  Diarrhea progressed to be more in amount and frequency up to 4-6 times a day.  Also started having fever. 9/14, return to urgent care due to persistence of symptoms and was referred to ED.  In the ED, patient had a fever of one 1.1, tachycardic to 103, blood pressure maintained, breathing on room air.   Labs showed WBC count elevated to 22.3, creatinine elevated to 1.29. COVID-19 PCR negative. CT abdomen and pelvis with contrast was consistent with diffuse colitis.   C. difficile assay sent on admission was positive.  Patient was admitted under hospitalist service.   Subjective: Patient was seen and examined this afternoon.. Elderly Caucasian male.  Lying down in bed.  Hard of hearing.  Not in distress.  No abdominal pain.  States diarrhea is better. Chart reviewed. T-max 102.9 last night.  Blood pressure in low normal range. Labs from this morning show improvement in creatinine to 0.95.  WC count is actually up to 25,000.  Assessment/Plan: Sepsis secondary to C. difficile colitis -Presented with 3 weeks of diarrhea not responding to antibiotics and antidiarrheals. -CT abdomen with diffuse colitis.  No perforation. -C. difficile assay on admission positive.  -Currently on oral vancomycin, IV fluids.  -Continues to have fever and leukocytosis and diarrhea.  Expect to improve in next 24  to 48 hours.  Continue to monitor. Recent Labs  Lab 06/24/20 1607 06/25/20 0543  WBC 22.3* 25.3*  PROCALCITON  --  6.43   AKI -Improving with hydration.   Recent Labs    06/24/20 1607 06/25/20 0543  BUN 18 17  CREATININE 1.29* 0.95   Microcytic anemia -Ferritin level normal at 165 but iron level low. Recent Labs    06/24/20 1607 06/25/20 0543  HGB 11.8* 10.9*   Recent Labs    06/24/20 1607 06/25/20 0543  MCV 77.5* 76.8*  FERRITIN  --  165  TIBC  --  225*  IRON  --  7*   Hypertension/pulmonary pretension  -Home meds include Imdur 60 mg daily, amlodipine 5 mg daily.   -Currently both on hold because of soft blood pressure.    CAD Continue aspirin and statin.  Mobility: Encourage ambulation.  PT eval Code Status:   Code Status: Full Code  Nutritional status: There is no height or weight on file to calculate BMI.     Diet Order            Diet Heart Room service appropriate? Yes; Fluid consistency: Thin  Diet effective now                 DVT prophylaxis: heparin injection 5,000 Units Start: 06/24/20 2200 SCDs Start: 06/24/20 2037   Antimicrobials:  Oral vancomycin Fluid: Normal saline at 100/h Consultants: None Family Communication: called and updated patient's wife this afternoon.  Status is: Inpatient  Remains inpatient appropriate because:Persistent severe electrolyte disturbances, Ongoing diagnostic testing needed not  appropriate for outpatient work up and IV treatments appropriate due to intensity of illness or inability to take PO   Dispo: The patient is from: Home              Anticipated d/c is to: Home pending PT eval              Anticipated d/c date is: 2 days              Patient currently is not medically stable to d/c.   Infusions:  . sodium chloride 100 mL/hr at 06/25/20 1121    Scheduled Meds: . aspirin  81 mg Oral QODAY  . cholecalciferol  1,000 Units Oral [id number]  . heparin  5,000 Units Subcutaneous Q8H  . multivitamin  with minerals  1 tablet Oral Daily  . pantoprazole  40 mg Oral Daily  . vancomycin  125 mg Oral QID    Antimicrobials: Anti-infectives (From admission, onward)   Start     Dose/Rate Route Frequency Ordered Stop   06/25/20 0800  cefTRIAXone (ROCEPHIN) 2 g in sodium chloride 0.9 % 100 mL IVPB  Status:  Discontinued        2 g 200 mL/hr over 30 Minutes Intravenous Every 24 hours 06/24/20 2037 06/24/20 2324   06/25/20 0400  metroNIDAZOLE (FLAGYL) IVPB 500 mg  Status:  Discontinued        500 mg 100 mL/hr over 60 Minutes Intravenous Every 8 hours 06/24/20 2037 06/24/20 2324   06/24/20 2330  vancomycin (VANCOCIN) 50 mg/mL oral solution 125 mg        125 mg Oral 4 times daily 06/24/20 2323 07/04/20 2159   06/24/20 1945  cefTRIAXone (ROCEPHIN) 2 g in sodium chloride 0.9 % 100 mL IVPB       "And" Linked Group Details   2 g 200 mL/hr over 30 Minutes Intravenous  Once 06/24/20 1934 06/24/20 2320   06/24/20 1945  metroNIDAZOLE (FLAGYL) IVPB 500 mg       "And" Linked Group Details   500 mg 100 mL/hr over 60 Minutes Intravenous  Once 06/24/20 1934 06/25/20 0031      PRN meds: acetaminophen **OR** acetaminophen   Objective: Vitals:   06/25/20 0135 06/25/20 0627  BP: 103/69 115/72  Pulse: 98 85  Resp: 18 15  Temp: (!) 102.9 F (39.4 C) 99.4 F (37.4 C)  SpO2: 91% 96%    Intake/Output Summary (Last 24 hours) at 06/25/2020 1237 Last data filed at 06/24/2020 1855 Gross per 24 hour  Intake 982.35 ml  Output --  Net 982.35 ml   There were no vitals filed for this visit. Weight change:  There is no height or weight on file to calculate BMI.   Physical Exam: General exam: Appears calm and comfortable.  Not in physical distress Skin: No rashes, lesions or ulcers. HEENT: Atraumatic, normocephalic, supple neck, no obvious bleeding Lungs: Clear to auscultation bilaterally CVS: Regular rate and rhythm, no murmur GI/Abd soft, nontender, nondistended, bowel sound present CNS: Alert,  awake, oriented to place and person.  Hard of hearing Psychiatry: Mood appropriate Extremities: No pedal edema, no calf tenderness  Data Review: I have personally reviewed the laboratory data and studies available.  Recent Labs  Lab 06/24/20 1607 06/25/20 0543  WBC 22.3* 25.3*  HGB 11.8* 10.9*  HCT 38.3* 35.8*  MCV 77.5* 76.8*  PLT 325 295   Recent Labs  Lab 06/24/20 1607 06/25/20 0543  NA 133* 135  K 3.6 3.4*  CL 99  105  CO2 24 22  GLUCOSE 133* 138*  BUN 18 17  CREATININE 1.29* 0.95  CALCIUM 8.2* 7.7*  MG  --  1.6*  PHOS  --  2.6   F/u labs ordered.  Signed, Terrilee Croak, MD [facility] 06/25/2020

## 2020-06-26 LAB — CBC WITH DIFFERENTIAL/PLATELET
Band Neutrophils: 2 %
Basophils Absolute: 0 10*3/uL (ref 0.0–0.1)
Basophils Relative: 0 %
Eosinophils Absolute: 0 10*3/uL (ref 0.0–0.5)
Eosinophils Relative: 0 %
HCT: 37.3 % — ABNORMAL LOW (ref 39.0–52.0)
Hemoglobin: 11.6 g/dL — ABNORMAL LOW (ref 13.0–17.0)
Lymphocytes Relative: 1 %
Lymphs Abs: 0.2 10*3/uL — ABNORMAL LOW (ref 0.7–4.0)
MCH: 24.1 pg — ABNORMAL LOW (ref 26.0–34.0)
MCHC: 31.1 g/dL (ref 30.0–36.0)
MCV: 77.5 fL — ABNORMAL LOW (ref 80.0–100.0)
Metamyelocytes Relative: 2 %
Monocytes Absolute: 0.7 10*3/uL (ref 0.1–1.0)
Monocytes Relative: 3 %
Myelocytes: 2 %
Neutro Abs: 22.7 10*3/uL — ABNORMAL HIGH (ref 1.7–7.7)
Neutrophils Relative %: 90 %
Platelets: 302 10*3/uL (ref 150–400)
RBC: 4.81 MIL/uL (ref 4.22–5.81)
RDW: 16.6 % — ABNORMAL HIGH (ref 11.5–15.5)
WBC: 24.7 10*3/uL — ABNORMAL HIGH (ref 4.0–10.5)
nRBC: 0 % (ref 0.0–0.2)

## 2020-06-26 LAB — BASIC METABOLIC PANEL
Anion gap: 6 (ref 5–15)
BUN: 24 mg/dL — ABNORMAL HIGH (ref 8–23)
CO2: 24 mmol/L (ref 22–32)
Calcium: 7.8 mg/dL — ABNORMAL LOW (ref 8.9–10.3)
Chloride: 105 mmol/L (ref 98–111)
Creatinine, Ser: 1.13 mg/dL (ref 0.61–1.24)
GFR calc Af Amer: >60 mL/min (ref >60)
GFR calc non Af Amer: >60 mL/min (ref >60)
Glucose, Bld: 121 mg/dL — ABNORMAL HIGH (ref 70–99)
Potassium: 4.7 mmol/L (ref 3.5–5.1)
Sodium: 135 mmol/L (ref 135–145)

## 2020-06-26 LAB — PHOSPHORUS: Phosphorus: 1.7 mg/dL — ABNORMAL LOW (ref 2.5–4.6)

## 2020-06-26 LAB — MAGNESIUM: Magnesium: 2.1 mg/dL (ref 1.7–2.4)

## 2020-06-26 MED ORDER — BOOST / RESOURCE BREEZE PO LIQD CUSTOM
1.0000 | Freq: Three times a day (TID) | ORAL | Status: DC
  Administered 2020-06-26 – 2020-07-03 (×23): 1 via ORAL

## 2020-06-26 MED ORDER — SODIUM PHOSPHATES 45 MMOLE/15ML IV SOLN
30.0000 mmol | Freq: Once | INTRAVENOUS | Status: AC
  Administered 2020-06-26: 30 mmol via INTRAVENOUS
  Filled 2020-06-26: qty 10

## 2020-06-26 NOTE — Evaluation (Signed)
Physical Therapy Evaluation Patient Details Name: Troy Sanchez MRN: [id number] DOB: 04-08-1941 Today's Date: 06/26/2020   History of Present Illness  Troy Sanchez is a 79 y.o. male with medical history significant for pulmonary hypertension, hypertension, CAD, hyperlipidemia and GERD who presents to the emergency department due to 3-week onset of diarrhea and generalized weakness.  Patient states that he has been to urgent care twice prior to returning today, he states that he was prescribed an antibiotic due to the diarrhea when he first went around August 28th (he was unable to remember the name of antibiotic) and there was no improvement in diarrhea despite the antibiotic.  Diarrhea was watery in nature and was 4-6 episodes daily, he endorsed loss of sense of taste shortly after onset of diarrhea and he also complained of fever.  Patient states that he has been taking over-the-counter antidiarrheal medication (he did not remember the name of medication) without improvement.  He returned to urgent care today due to persistence of symptoms and increased weakness and he was asked to go to the ED for further evaluation and management.  Patient denies abdominal pain, nausea, vomiting, chest pain, shortness of breath, he also denies any sick contact.    Clinical Impression  Patient functioning near baseline for functional mobility and gait, limited mostly due to having severe diarrhea, demonstrates good return for bed mobility, transfers, but has to lean on IV pole for support.  Patient will benefit from continued physical therapy in hospital and recommended venue below to increase strength, balance, endurance for safe ADLs and gait.     Follow Up Recommendations Home health PT;Supervision - Intermittent    Equipment Recommendations  None recommended by PT    Recommendations for Other Services       Precautions / Restrictions Precautions Precautions: Fall Restrictions Weight Bearing  Restrictions: No      Mobility  Bed Mobility Overal bed mobility: Modified Independent                Transfers Overall transfer level: Needs assistance Equipment used: None (has to lean on IV pole for support) Transfers: Sit to/from [facility] Sit to Stand: Supervision Stand pivot transfers: Supervision       General transfer comment: slightly labored movement, has to lean on IV pole for support  Ambulation/Gait Ambulation/Gait assistance: Supervision Gait Distance (Feet): 15 Feet Assistive device: IV Pole Gait Pattern/deviations: Decreased step length - right;Decreased step length - left;Decreased stride length Gait velocity: decreased   General Gait Details: slow labored cadence without loss of balance, limited secondary to severe diarrhea  Stairs            Wheelchair Mobility    Modified Rankin (Stroke Patients Only)       Balance Overall balance assessment: Mild deficits observed, not formally tested                                           Pertinent Vitals/Pain Pain Assessment: No/denies pain    Home Living Family/patient expects to be discharged to:: Private residence Living Arrangements: Spouse/significant other Available Help at Discharge: Family;Available 24 hours/day Type of Home: House Home Access: Stairs to enter Entrance Stairs-Rails: Right;Left;Can reach both Entrance Stairs-Number of Steps: 5 Home Layout: Two level Home Equipment: Walker - 2 wheels;Cane - single point;Bedside commode;Shower seat      Prior Function Level of Independence: Independent  Comments: Hydrographic surveyor, drives, 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     Hand Dominance        Extremity/Trunk Assessment   Upper Extremity Assessment Upper Extremity Assessment: Overall WFL for tasks assessed    Lower Extremity Assessment Lower Extremity Assessment: Generalized weakness    Cervical / Trunk  Assessment Cervical / Trunk Assessment: Normal  Communication   Communication: No difficulties  Cognition Arousal/Alertness: Awake/alert Behavior During Therapy: WFL for tasks assessed/performed Overall Cognitive Status: Within Functional Limits for tasks assessed                                        General Comments      Exercises     Assessment/Plan    PT Assessment Patient needs continued PT services  PT Problem List Decreased strength;Decreased activity tolerance;Decreased balance;Decreased mobility       PT Treatment Interventions Balance training;Gait training;Stair training;Functional mobility training;Therapeutic activities;Therapeutic exercise;Patient/family education    PT Goals (Current goals can be found in the Care Plan section)  Acute Rehab PT Goals Patient Stated Goal: return home with family to assist PT Goal Formulation: With patient Time For Goal Achievement: 07/03/20 Potential to Achieve Goals: Good    Frequency Min 3X/week   Barriers to discharge        Co-evaluation               AM-PAC PT "6 Clicks" Mobility  Outcome Measure Help needed turning from your back to your side while in a flat bed without using bedrails?: None Help needed moving from lying on your back to sitting on the side of a flat bed without using bedrails?: None Help needed moving to and from a bed to a chair (including a wheelchair)?: None Help needed standing up from a chair using your arms (e.g., wheelchair or bedside chair)?: None Help needed to walk in hospital room?: A Little Help needed climbing 3-5 steps with a railing? : A Little 6 Click Score: 22    End of Session   Activity Tolerance: Patient tolerated treatment well;Patient limited by fatigue Patient left: with call bell/phone within reach (sitting on commode in bathroom) Nurse Communication: Mobility status PT Visit Diagnosis: Unsteadiness on feet (R26.81);Other abnormalities of gait and  mobility (R26.89);Muscle weakness (generalized) (M62.81)    Time: 0762-2633 PT Time Calculation (min) (ACUTE ONLY): 14 min   Charges:   PT Evaluation $PT Eval Low Complexity: 1 Low PT Treatments $Therapeutic Activity: 8-22 mins        11:51 AM, 06/26/20 Lonell Grandchild, MPT Physical Therapist with [facility] [phone] office [phone] mobile phone

## 2020-06-26 NOTE — Plan of Care (Signed)
  Problem: Acute Rehab PT Goals(only PT should resolve) Goal: Patient Will Transfer Sit To/From Stand Outcome: Progressing Flowsheets (Taken 06/26/2020 1153) Patient will transfer sit to/from stand:  Independently  with modified independence Goal: Pt Will Transfer Bed To Chair/Chair To Bed Outcome: Progressing Flowsheets (Taken 06/26/2020 1153) Pt will Transfer Bed to Chair/Chair to Bed:  Independently  with modified independence Goal: Pt Will Ambulate Outcome: Progressing Flowsheets (Taken 06/26/2020 1153) Pt will Ambulate:  > 125 feet  with modified independence  with supervision   11:54 AM, 06/26/20 Troy Sanchez, MPT Physical Therapist with [facility] [phone] office [phone] mobile phone

## 2020-06-26 NOTE — Plan of Care (Signed)

## 2020-06-26 NOTE — Progress Notes (Signed)
Initial Nutrition Assessment  DOCUMENTATION CODES:   Non-severe (moderate) malnutrition in context of acute illness/injury  INTERVENTION:  Boost Breeze po TID, each supplement provides 250 kcal and 9 grams of protein  MVI with minerals daily  Education provided  Pt is high risk for refeeding, recommend continuing to monitor P, Mg, K daily   NUTRITION DIAGNOSIS:   Moderate Malnutrition related to acute illness (Norovirus) as evidenced by per patient/family report, energy intake < 75% for > or equal to 1 month, percent weight loss, moderate fat depletion, mild muscle depletion, moderate muscle depletion, edema.   GOAL:   Patient will meet greater than or equal to 90% of their needs    MONITOR:   PO intake, Supplement acceptance, Weight trends, Labs, I & O's  REASON FOR ASSESSMENT:   Malnutrition Screening Tool    ASSESSMENT:  79 year old male with history significant of pulmonary HTN, CAD, HLD, GERD, hiatal hernia s/p repair, and gastrostomy in 2015 presented with 3 week history of diarrhea and generalized weakness s/p failed outpt antibiotics and over the counter antidiarrheals admitted for acute colitis.  Positive for Norovirus  Met with pt bedside, he reports 5-6 episodes of diarrhea this morning and bites of breakfast. Endorses diminished appetite over the past 3 weeks secondary to ongoing diarrhea. He reports his wife was recently hospitalized for stroke early August, reports eating a lot of meals out during that time. Usually he eats most of meals at home, recalls larger breakfast followed by lighter lunch and balanced dinner prepared by wife. Patient endorses significant 30 lb wt loss over the last 2 months. Last wt prior to admit 98.4 kg on 11/12/19, currently he weighs 87 kg (191.4 lb) Per chart, weights stable 98.1-99.5 kg (216-219 lbs) over the past 2 years. This indicates ~ 25 lbs (11.6%) decrease in weights over the last 7 months; significant. Given trends, dietary  recall, as well as mild/moderate subcutaneous fat and muscle depletions noted on exam, pt meets criteria for acute moderate malnutrition. RD educated on soft food diet until GI symptoms resolve, encouraged small frequent meals for easier digestion, and recommended oral nutrition supplement to aid with meeting needs.   I/Os: +3717 ml since admit Medications reviewed and include: D3, MVI, Protonix, Vancomycin  IVF: NaCl @ 100 ml/hr Labs: K 4.7 (WNL), P 1.7 (L) trended down, Mg 2.1 (WNL), WBC 24.7 (H), Hgb 11.6 (L), HCT 37.3 (L)  NUTRITION - FOCUSED PHYSICAL EXAM: Findings: Moderate subcutaneous fat depletion to orbital, upper arm, buccal regions; Mild muscle depletion to temple, clavicle, dorsal hand, posterior calf; Moderate clavicle and acromion muscle depletion; Non-pitting BLE edema  Diet Order:  Meal completion 10-25%  Diet Order            Diet Heart Room service appropriate? Yes; Fluid consistency: Thin  Diet effective now                 EDUCATION NEEDS:   Education needs have been addressed  Skin:  Skin Assessment: Reviewed RN Assessment  Last BM:  9/16 type 7  Height:   Ht Readings from Last 1 Encounters:  06/25/20 5\' 10"  (1.778 m)    Weight:   Wt Readings from Last 1 Encounters:  06/25/20 87 kg    Ideal Body Weight:  75.5 kg  BMI:  Body mass index is 27.53 kg/m.  Estimated Nutritional Needs:   Kcal:  2100-2300  Protein:  105-115  Fluid:  >/= 2L/day   06/27/20, RD, LDN Clinical Nutrition  After Hours/Weekend Pager # in [employer]

## 2020-06-26 NOTE — Progress Notes (Signed)
PROGRESS NOTE  Troy Sanchez  DOB: 1941-01-04  PCP: Deland Pretty, MD DJM:[id number]  DOA: 06/24/2020  LOS: 2 days   Chief Complaint  Patient presents with  . Diarrhea  . Fatigue    Brief narrative: Troy Sanchez is a 79 y.o. male with medical history significant for pulmonary hypertension, hypertension, CAD, hyperlipidemia and GERD. Patient presented to the ED on 9/14 with 3-week history of diarrhea and progressive generalized weakness.  8/28, he went to an urgent care center and was prescribed antibiotic which did not help.  He tried over-the-counter antidiarrheals as well without much help.  Diarrhea progressed to be more in amount and frequency up to 4-6 times a day.  Also started having fever. 9/14, return to urgent care due to persistence of symptoms and was referred to ED.  In the ED, patient had a fever of one 1.1, tachycardic to 103, blood pressure maintained, breathing on room air.   Labs showed WBC count elevated to 22.3, creatinine elevated to 1.29. COVID-19 PCR negative. CT abdomen and pelvis with contrast was consistent with diffuse colitis.   C. difficile assay sent on admission was positive.  Patient was admitted under hospitalist service.   Subjective: Patient was seen and examined this morning. Continues to have diarrhea. WBC count remains elevated today.  Stool studies also showed norovirus positive.  Assessment/Plan: Sepsis - POA Diarrhea due to colitis from C. Difficile and norovirus -Presented with 3 weeks of diarrhea not responding to antibiotics and antidiarrheals. -CT abdomen with diffuse colitis.  No perforation. -C. difficile assay on admission positive.  GI pathogen panel also positive for norovirus. -Currently on oral vancomycin, IV fluids.  -No recurrence of fever in last 24 hours but WBC count remains elevated at 24.7.  -Continue to monitor. -May benefit from a Flexi-Seal because of persistent severe frequent watery diarrhea Recent Labs    Lab 06/24/20 1607 06/25/20 0543 06/26/20 0647  WBC 22.3* 25.3* 24.7*  PROCALCITON  --  6.43  --    AKI -Improving with hydration.   Recent Labs    06/24/20 1607 06/25/20 0543 06/26/20 0647  BUN 18 17 24*  CREATININE 1.29* 0.95 1.13   Hypokalemia/hypomagnesemia/hypophosphatemia -Low electrolytes levels because of diarrhea. -Potassium and magnesium level replaced yesterday.  Level normal today.  Phosphorus level low at 1.7 today.  Replacement ordered. Recent Labs  Lab 06/24/20 1607 06/25/20 0543 06/26/20 0647  K 3.6 3.4* 4.7  MG  --  1.6* 2.1  PHOS  --  2.6 1.7*   Microcytic anemia -Ferritin level normal at 165 but iron level low. Recent Labs    06/24/20 1607 06/25/20 0543 06/26/20 0647  HGB 11.8* 10.9* 11.6*   Recent Labs    06/25/20 0543 06/26/20 0647  MCV 76.8* 77.5*  FERRITIN 165  --   TIBC 225*  --   IRON 7*  --    Hypertension/pulmonary hypertension  -Home meds include Imdur 60 mg daily, amlodipine 5 mg daily.   -Currently both on hold because of soft blood pressure.    CAD Continue aspirin and statin.  Mobility: Encourage ambulation.  PT eval Code Status:   Code Status: Full Code  Nutritional status: Body mass index is 27.53 kg/m.     Diet Order            Diet Heart Room service appropriate? Yes; Fluid consistency: Thin  Diet effective now                 DVT prophylaxis: heparin  injection 5,000 Units Start: 06/24/20 2200 SCDs Start: 06/24/20 2037   Antimicrobials:  Oral vancomycin Fluid: Normal saline at 100 mL/h Consultants: None Family Communication: called and updated patient's wife this afternoon.  Status is: Inpatient  Remains inpatient appropriate because:Persistent severe electrolyte disturbances, Ongoing diagnostic testing needed not appropriate for outpatient work up and IV treatments appropriate due to intensity of illness or inability to take PO   Dispo: The patient is from: Home              Anticipated d/c is  to: Home pending PT eval              Anticipated d/c date is: 2 days              Patient currently is not medically stable to d/c.   Infusions:  . sodium chloride 100 mL/hr at 06/26/20 0606  . sodium phosphate  Dextrose 5% IVPB      Scheduled Meds: . aspirin  81 mg Oral QODAY  . cholecalciferol  1,000 Units Oral BH-[id number]  . feeding supplement  1 Container Oral TID BM  . heparin  5,000 Units Subcutaneous Q8H  . multivitamin with minerals  1 tablet Oral Daily  . pantoprazole  40 mg Oral Daily  . vancomycin  125 mg Oral QID    Antimicrobials: Anti-infectives (From admission, onward)   Start     Dose/Rate Route Frequency Ordered Stop   06/25/20 0800  cefTRIAXone (ROCEPHIN) 2 g in sodium chloride 0.9 % 100 mL IVPB  Status:  Discontinued        2 g 200 mL/hr over 30 Minutes Intravenous Every 24 hours 06/24/20 2037 06/24/20 2324   06/25/20 0400  metroNIDAZOLE (FLAGYL) IVPB 500 mg  Status:  Discontinued        500 mg 100 mL/hr over 60 Minutes Intravenous Every 8 hours 06/24/20 2037 06/24/20 2324   06/24/20 2330  vancomycin (VANCOCIN) 50 mg/mL oral solution 125 mg        125 mg Oral 4 times daily 06/24/20 2323 07/04/20 2159   06/24/20 1945  cefTRIAXone (ROCEPHIN) 2 g in sodium chloride 0.9 % 100 mL IVPB       "And" Linked Group Details   2 g 200 mL/hr over 30 Minutes Intravenous  Once 06/24/20 1934 06/24/20 2320   06/24/20 1945  metroNIDAZOLE (FLAGYL) IVPB 500 mg       "And" Linked Group Details   500 mg 100 mL/hr over 60 Minutes Intravenous  Once 06/24/20 1934 06/25/20 0031      PRN meds: acetaminophen **OR** acetaminophen, ondansetron (ZOFRAN) IV   Objective: Vitals:   06/26/20 0000 06/26/20 0606  BP: 103/71 101/69  Pulse: 94 89  Resp: 20 16  Temp: 99.9 F (37.7 C) 98.2 F (36.8 C)  SpO2: 93% 94%    Intake/Output Summary (Last 24 hours) at 06/26/2020 1418 Last data filed at 06/26/2020 0900 Gross per 24 hour  Intake 2804.67 ml  Output --  Net 2804.67 ml   Filed  Weights   06/25/20 1527  Weight: 87 kg   Weight change:  Body mass index is 27.53 kg/m.   Physical Exam: General exam: Appears calm and comfortable.  Looks tired because of diarrheal frequency Skin: No rashes, lesions or ulcers. HEENT: Atraumatic, normocephalic, supple neck, no obvious bleeding Lungs: Clear to auscultation bilaterally CVS: Regular rate and rhythm, no murmur GI/Abd soft, nontender, nondistended, bowel sound present CNS: Alert, awake, oriented to place and person.  Hard of hearing  Psychiatry: Mood appropriate Extremities: No pedal edema, no calf tenderness  Data Review: I have personally reviewed the laboratory data and studies available.  Recent Labs  Lab 06/24/20 1607 06/25/20 0543 06/26/20 0647  WBC 22.3* 25.3* 24.7*  NEUTROABS  --   --  22.7*  HGB 11.8* 10.9* 11.6*  HCT 38.3* 35.8* 37.3*  MCV 77.5* 76.8* 77.5*  PLT 325 295 302   Recent Labs  Lab 06/24/20 1607 06/25/20 0543 06/26/20 0647  NA 133* 135 135  K 3.6 3.4* 4.7  CL 99 105 105  CO2 24 22 24   GLUCOSE 133* 138* 121*  BUN 18 17 24*  CREATININE 1.29* 0.95 1.13  CALCIUM 8.2* 7.7* 7.8*  MG  --  1.6* 2.1  PHOS  --  2.6 1.7*   F/u labs ordered.  Signed, Terrilee Croak, MD [facility] 06/26/2020

## 2020-06-27 LAB — CBC WITH DIFFERENTIAL/PLATELET
Abs Immature Granulocytes: 0.18 10*3/uL — ABNORMAL HIGH (ref 0.00–0.07)
Band Neutrophils: 10 %
Basophils Absolute: 0 10*3/uL (ref 0.0–0.1)
Basophils Relative: 0 %
Blasts: 0 %
Eosinophils Absolute: 0 10*3/uL (ref 0.0–0.5)
Eosinophils Relative: 0 %
HCT: 35.8 % — ABNORMAL LOW (ref 39.0–52.0)
Hemoglobin: 11 g/dL — ABNORMAL LOW (ref 13.0–17.0)
Immature Granulocytes: 1 %
Lymphocytes Relative: 2 %
Lymphs Abs: 0.4 10*3/uL — ABNORMAL LOW (ref 0.7–4.0)
MCH: 23.7 pg — ABNORMAL LOW (ref 26.0–34.0)
MCHC: 30.7 g/dL (ref 30.0–36.0)
MCV: 77.2 fL — ABNORMAL LOW (ref 80.0–100.0)
Metamyelocytes Relative: 1 %
Monocytes Absolute: 1.1 10*3/uL — ABNORMAL HIGH (ref 0.1–1.0)
Monocytes Relative: 6 %
Myelocytes: 0 %
Neutro Abs: 16.5 10*3/uL — ABNORMAL HIGH (ref 1.7–7.7)
Neutrophils Relative %: 81 %
Other: 0 %
Platelets: 275 10*3/uL (ref 150–400)
Promyelocytes Relative: 0 %
RBC: 4.64 MIL/uL (ref 4.22–5.81)
RDW: 16.6 % — ABNORMAL HIGH (ref 11.5–15.5)
WBC: 18.2 10*3/uL — ABNORMAL HIGH (ref 4.0–10.5)
nRBC: 0 % (ref 0.0–0.2)
nRBC: 0 /100 WBC

## 2020-06-27 LAB — BASIC METABOLIC PANEL
Anion gap: 6 (ref 5–15)
BUN: 20 mg/dL (ref 8–23)
CO2: 24 mmol/L (ref 22–32)
Calcium: 7.4 mg/dL — ABNORMAL LOW (ref 8.9–10.3)
Chloride: 105 mmol/L (ref 98–111)
Creatinine, Ser: 0.86 mg/dL (ref 0.61–1.24)
GFR calc Af Amer: >60 mL/min (ref >60)
GFR calc non Af Amer: >60 mL/min (ref >60)
Glucose, Bld: 101 mg/dL — ABNORMAL HIGH (ref 70–99)
Potassium: 3.2 mmol/L — ABNORMAL LOW (ref 3.5–5.1)
Sodium: 135 mmol/L (ref 135–145)

## 2020-06-27 LAB — PHOSPHORUS: Phosphorus: 2.1 mg/dL — ABNORMAL LOW (ref 2.5–4.6)

## 2020-06-27 LAB — MAGNESIUM: Magnesium: 2 mg/dL (ref 1.7–2.4)

## 2020-06-27 MED ORDER — LOPERAMIDE HCL 2 MG PO CAPS
2.0000 mg | ORAL_CAPSULE | Freq: Four times a day (QID) | ORAL | Status: AC
  Filled 2020-06-27 (×3): qty 1

## 2020-06-27 MED ORDER — POTASSIUM CHLORIDE CRYS ER 20 MEQ PO TBCR
40.0000 meq | EXTENDED_RELEASE_TABLET | ORAL | Status: AC
  Administered 2020-06-27 (×2): 40 meq via ORAL
  Filled 2020-06-27 (×2): qty 2

## 2020-06-27 MED ORDER — SODIUM PHOSPHATES 45 MMOLE/15ML IV SOLN
30.0000 mmol | Freq: Once | INTRAVENOUS | Status: AC
  Administered 2020-06-27: 30 mmol via INTRAVENOUS
  Filled 2020-06-27: qty 10

## 2020-06-27 NOTE — Progress Notes (Addendum)
Physical Therapy Treatment Patient Details Name: Troy Sanchez MRN: [id number] DOB: 26-Nov-1940 Today's Date: 06/27/2020    History of Present Illness Troy Sanchez is a 79 y.o. male with medical history significant for pulmonary hypertension, hypertension, CAD, hyperlipidemia and GERD who presents to the emergency department due to 3-week onset of diarrhea and generalized weakness.  Patient states that he has been to urgent care twice prior to returning today, he states that he was prescribed an antibiotic due to the diarrhea when he first went around August 28th (he was unable to remember the name of antibiotic) and there was no improvement in diarrhea despite the antibiotic.  Diarrhea was watery in nature and was 4-6 episodes daily, he endorsed loss of sense of taste shortly after onset of diarrhea and he also complained of fever.  Patient states that he has been taking over-the-counter antidiarrheal medication (he did not remember the name of medication) without improvement.  He returned to urgent care today due to persistence of symptoms and increased weakness and he was asked to go to the ED for further evaluation and management.  Patient denies abdominal pain, nausea, vomiting, chest pain, shortness of breath, he also denies any sick contact.    PT Comments    The patient was able to perform supine to sit transfer with modified independence today. He tolerated upright sitting without an increase in dizziness. He performed sit to stand transfer with supervision assistance and ambulated 10 feet using IV pole for support before needing to use the restroom due to diarrhea. After he had a loose bowel movement, he proceeded to ambulate 40 feet using the IV pole for assistance and demonstrated slightly labored steps but without LOB. He tolerated seated therapeutic exercises without increase in dizziness or fatigue. Patient tolerated sitting up in chair after therapy- RN notified. PLAN: The patient will  continue to benefit from skilled physical therapy services in hospital at recommended venue below in order to improve balance, gait, and ADL's to promote independence in functional activities.     Follow Up Recommendations  Home health PT;Supervision - Intermittent     Equipment Recommendations  None recommended by PT    Recommendations for Other Services       Precautions / Restrictions Precautions Precautions: Fall Restrictions Weight Bearing Restrictions: No    Mobility  Bed Mobility Overal bed mobility: Modified Independent                Transfers Overall transfer level: Needs assistance Equipment used: None Transfers: Sit to/from Stand Sit to Stand: Supervision         General transfer comment: slightly labored movement  Ambulation/Gait Ambulation/Gait assistance: Supervision Gait Distance (Feet): 40 Feet Assistive device: IV Pole Gait Pattern/deviations: Decreased step length - right;Decreased step length - left;Decreased stride length;WFL(Within Functional Limits) Gait velocity: decreased   General Gait Details: slow labored cadence without loss of balance, had to take bathroom break during session due to diarrhea   Stairs             Wheelchair Mobility    Modified Rankin (Stroke Patients Only)       Balance Overall balance assessment: Modified Independent Sitting-balance support: No upper extremity supported;Feet supported Sitting balance-Leahy Scale: Good Sitting balance - Comments: sitting EOB   Standing balance support: Single extremity supported;During functional activity Standing balance-Leahy Scale: Good Standing balance comment: mild use of IV pole for support  Cognition Arousal/Alertness: Awake/alert Behavior During Therapy: WFL for tasks assessed/performed Overall Cognitive Status: Within Functional Limits for tasks assessed                                         Exercises General Exercises - Lower Extremity Long Arc Quad: AROM;Strengthening;Seated;Both;10 reps Hip Flexion/Marching: AROM;Strengthening;Seated;Both;10 reps Toe Raises: AROM;Strengthening;Seated;Both;10 reps Heel Raises: AROM;Seated;Strengthening;Both;10 reps    General Comments        Pertinent Vitals/Pain Pain Assessment: No/denies pain    Home Living                      Prior Function            PT Goals (current goals can now be found in the care plan section) Acute Rehab PT Goals Patient Stated Goal: return home with family to assist PT Goal Formulation: With patient Time For Goal Achievement: 07/03/20 Potential to Achieve Goals: Good Progress towards PT goals: Progressing toward goals    Frequency    Min 3X/week      PT Plan Current plan remains appropriate    Co-evaluation              AM-PAC PT "6 Clicks" Mobility   Outcome Measure  Help needed turning from your back to your side while in a flat bed without using bedrails?: None Help needed moving from lying on your back to sitting on the side of a flat bed without using bedrails?: None Help needed moving to and from a bed to a chair (including a wheelchair)?: None Help needed standing up from a chair using your arms (e.g., wheelchair or bedside chair)?: None Help needed to walk in hospital room?: A Little Help needed climbing 3-5 steps with a railing? : A Little 6 Click Score: 22    End of Session   Activity Tolerance: Patient tolerated treatment well Patient left: with call bell/phone within reach;in chair Nurse Communication: Mobility status PT Visit Diagnosis: Unsteadiness on feet (R26.81);Other abnormalities of gait and mobility (R26.89);Muscle weakness (generalized) (M62.81)     Time: 7867-6720 PT Time Calculation (min) (ACUTE ONLY): 21 min  Charges:  $Gait Training: 8-22 mins $Therapeutic Exercise: 8-22 mins                     2:06 PM , 06/27/20 Karlyn Agee,  SPT Physical Therapy with [facility] [phone] office  During this treatment session, the therapist was present, participating in and directing the treatment.  2:06 PM, 06/27/20 Lonell Grandchild, MPT Physical Therapist with [facility] 336 [phone] office [phone] mobile phone

## 2020-06-27 NOTE — Progress Notes (Signed)
PROGRESS NOTE  Troy Sanchez  DOB: [birth year]  PCP: Deland Pretty, MD UEA:[id number]  DOA: 06/24/2020  LOS: 3 days   Chief Complaint  Patient presents with  . Diarrhea  . Fatigue    Brief narrative: Troy Sanchez is a 79 y.o. male with medical history significant for pulmonary hypertension, hypertension, CAD, hyperlipidemia and GERD. Patient presented to the ED on 9/14 with 3-week history of diarrhea and progressive generalized weakness.  8/28, he went to an urgent care center and was prescribed antibiotic which did not help.  He tried over-the-counter antidiarrheals as well without much help.  Diarrhea progressed to be more in amount and frequency up to 4-6 times a day.  Also started having fever. 9/14, return to urgent care due to persistence of symptoms and was referred to ED.  In the ED, patient had a fever of one 1.1, tachycardic to 103, blood pressure maintained, breathing on room air.   Labs showed WBC count elevated to 22.3, creatinine elevated to 1.29. COVID-19 PCR negative. CT abdomen and pelvis with contrast was consistent with diffuse colitis.   C. difficile assay sent on admission was positive.  Patient was admitted under hospitalist service.   Subjective: Patient was seen and examined this morning. Lying on bed.  Not in distress.  Continues to have multiple episodes of diarrhea.  Reports 6-7 episodes last night. Vital signs stable.  WBC trending down.  Assessment/Plan: Sepsis - POA Diarrhea due to colitis from C. Difficile and norovirus -Presented with 3 weeks of diarrhea not responding to antibiotics and antidiarrheals. -CT abdomen with diffuse colitis.  No perforation. -C. difficile assay on admission positive.  GI pathogen panel also positive for norovirus. -Currently on oral vancomycin, IV fluids.  -No recurrence of fever in last 24 hours and WBC count trending down but patient continues to have significant amount of diarrhea. -I will start him on  scheduled Imodium 2 mg every 6 hours for next 48 hours. -Continue to monitor. -May benefit from a Flexi-Seal because of persistent severe frequent watery diarrhea Recent Labs  Lab 06/24/20 1607 06/25/20 0543 06/26/20 0647 06/27/20 0838  WBC 22.3* 25.3* 24.7* 18.2*  PROCALCITON  --  6.43  --   --    AKI -Improving with hydration.   -I will still continue IV fluid because of persistent diarrhea loss Recent Labs    06/24/20 1607 06/25/20 0543 06/26/20 0647 06/27/20 0838  BUN 18 17 24* 20  CREATININE 1.29* 0.95 1.13 0.86   Hypokalemia/hypomagnesemia/hypophosphatemia -Low electrolytes levels because of diarrhea. -Oral potassium and IV phosphorus replacement ordered today. Recent Labs  Lab 06/24/20 1607 06/25/20 0543 06/26/20 0647 06/27/20 0838  K 3.6 3.4* 4.7 3.2*  MG  --  1.6* 2.1 2.0  PHOS  --  2.6 1.7* 2.1*   Microcytic anemia -Ferritin level normal at 165 but iron level low. -Hemoglobin level stable.  Recent Labs    06/24/20 1607 06/25/20 0543 06/26/20 0647 06/27/20 0838  HGB 11.8* 10.9* 11.6* 11.0*   Recent Labs    06/25/20 0543 06/25/20 0543 06/26/20 0647 06/27/20 0838  MCV 76.8*   < > 77.5* 77.2*  FERRITIN 165  --   --   --   TIBC 225*  --   --   --   IRON 7*  --   --   --    < > = values in this interval not displayed.   Hypertension/pulmonary hypertension  -Home meds include Imdur 60 mg daily, amlodipine 5 mg daily.   -  Currently both on hold because of soft blood pressure.    CAD Continue aspirin and statin.  Mobility: Encourage ambulation.  PT eval obtained.  Home health PT recommended. Code Status:   Code Status: Full Code  Nutritional status: Body mass index is 27.53 kg/m.     Diet Order            Diet Heart Room service appropriate? Yes; Fluid consistency: Thin  Diet effective now                 DVT prophylaxis: heparin injection 5,000 Units Start: 06/24/20 2200 SCDs Start: 06/24/20 2037   Antimicrobials:  Oral  vancomycin Fluid: Normal saline at 100 mL/h Consultants: None Family Communication: called and updated patient's wife this afternoon.  Status is: Inpatient  Remains inpatient appropriate because:Persistent severe electrolyte disturbances, Ongoing diagnostic testing needed not appropriate for outpatient work up and IV treatments appropriate due to intensity of illness or inability to take PO   Dispo: The patient is from: Home              Anticipated d/c is to: Home pending PT eval              Anticipated d/c date is: 2 days              Patient currently is not medically stable to d/c.   Infusions:  . sodium chloride 100 mL/hr at 06/26/20 1847  . sodium phosphate  Dextrose 5% IVPB 30 mmol (06/27/20 1151)    Scheduled Meds: . aspirin  81 mg Oral QODAY  . cholecalciferol  1,000 Units Oral BH-q7a  . feeding supplement  1 Container Oral TID BM  . heparin  5,000 Units Subcutaneous Q8H  . multivitamin with minerals  1 tablet Oral Daily  . pantoprazole  40 mg Oral Daily  . potassium chloride  40 mEq Oral Q2H  . vancomycin  125 mg Oral QID    Antimicrobials: Anti-infectives (From admission, onward)   Start     Dose/Rate Route Frequency Ordered Stop   06/25/20 0800  cefTRIAXone (ROCEPHIN) 2 g in sodium chloride 0.9 % 100 mL IVPB  Status:  Discontinued        2 g 200 mL/hr over 30 Minutes Intravenous Every 24 hours 06/24/20 2037 06/24/20 2324   06/25/20 0400  metroNIDAZOLE (FLAGYL) IVPB 500 mg  Status:  Discontinued        500 mg 100 mL/hr over 60 Minutes Intravenous Every 8 hours 06/24/20 2037 06/24/20 2324   06/24/20 2330  vancomycin (VANCOCIN) 50 mg/mL oral solution 125 mg        125 mg Oral 4 times daily 06/24/20 2323 07/04/20 2159   06/24/20 1945  cefTRIAXone (ROCEPHIN) 2 g in sodium chloride 0.9 % 100 mL IVPB       "And" Linked Group Details   2 g 200 mL/hr over 30 Minutes Intravenous  Once 06/24/20 1934 06/24/20 2320   06/24/20 1945  metroNIDAZOLE (FLAGYL) IVPB 500 mg        "And" Linked Group Details   500 mg 100 mL/hr over 60 Minutes Intravenous  Once 06/24/20 1934 06/25/20 0031      PRN meds: acetaminophen **OR** acetaminophen, ondansetron (ZOFRAN) IV   Objective: Vitals:   06/26/20 2130 06/27/20 0623  BP: 115/73 112/71  Pulse: 84 77  Resp:  (!) 22  Temp: 99.1 F (37.3 C) 98.5 F (36.9 C)  SpO2: 96% 95%    Intake/Output Summary (Last 24 hours) at  06/27/2020 1342 Last data filed at 06/27/2020 1300 Gross per 24 hour  Intake 3105.77 ml  Output --  Net 3105.77 ml   Filed Weights   06/25/20 1527  Weight: 87 kg   Weight change:  Body mass index is 27.53 kg/m.   Physical Exam: General exam: Appears calm and comfortable.  Looks tired because of diarrheal frequency Skin: No rashes, lesions or ulcers. HEENT: Atraumatic, normocephalic, supple neck, no obvious bleeding Lungs: Clear to auscultation bilaterally CVS: Regular rate and rhythm, no murmur GI/Abd soft, nontender, nondistended, bowel sound present CNS: Alert, awake, oriented to place and person.  Hard of hearing Psychiatry: Mood appropriate Extremities: No pedal edema, no calf tenderness  Data Review: I have personally reviewed the laboratory data and studies available.  Recent Labs  Lab 06/24/20 1607 06/25/20 0543 06/26/20 0647 06/27/20 0838  WBC 22.3* 25.3* 24.7* 18.2*  NEUTROABS  --   --  22.7* 16.5*  HGB 11.8* 10.9* 11.6* 11.0*  HCT 38.3* 35.8* 37.3* 35.8*  MCV 77.5* 76.8* 77.5* 77.2*  PLT 325 295 302 275   Recent Labs  Lab 06/24/20 1607 06/25/20 0543 06/26/20 0647 06/27/20 0838  NA 133* 135 135 135  K 3.6 3.4* 4.7 3.2*  CL 99 105 105 105  CO2 24 22 24 24   GLUCOSE 133* 138* 121* 101*  BUN 18 17 24* 20  CREATININE 1.29* 0.95 1.13 0.86  CALCIUM 8.2* 7.7* 7.8* 7.4*  MG  --  1.6* 2.1 2.0  PHOS  --  2.6 1.7* 2.1*   F/u labs ordered.  Signed, Terrilee Croak, MD [facility] 06/27/2020

## 2020-06-27 NOTE — TOC Progression Note (Signed)
Transition of Care [facility]) - Progression Note    Patient Details  Name: [name] MRN: [id number] Date of Birth: Nov 28, 1940  Transition of Care [facility]) CM/SW [place], [place] Phone Number: 06/27/2020, 11:27 AM  Clinical Narrative:    PT recommending HH PT. TOC referred to [facility] with Alvis Lemmings. Georgina Snell accepted referral. TOC to follow.      Barriers to Discharge: Continued Medical Work up  Expected Discharge Plan and Services                                     HH Arranged: PT [place]: Esmeralda Date [place]: 06/27/20   Representative spoke with at [place]: [place] ([place]) Interventions    Readmission Risk Interventions No flowsheet data found.

## 2020-06-27 NOTE — Plan of Care (Signed)

## 2020-06-28 DIAGNOSIS — E440 Moderate protein-calorie malnutrition: Secondary | ICD-10-CM | POA: Insufficient documentation

## 2020-06-28 LAB — PHOSPHORUS: Phosphorus: 2.3 mg/dL — ABNORMAL LOW (ref 2.5–4.6)

## 2020-06-28 LAB — BASIC METABOLIC PANEL
Anion gap: 6 (ref 5–15)
BUN: 14 mg/dL (ref 8–23)
CO2: 25 mmol/L (ref 22–32)
Calcium: 7.6 mg/dL — ABNORMAL LOW (ref 8.9–10.3)
Chloride: 105 mmol/L (ref 98–111)
Creatinine, Ser: 0.82 mg/dL (ref 0.61–1.24)
GFR calc Af Amer: >60 mL/min (ref >60)
GFR calc non Af Amer: >60 mL/min (ref >60)
Glucose, Bld: 99 mg/dL (ref 70–99)
Potassium: 3.9 mmol/L (ref 3.5–5.1)
Sodium: 136 mmol/L (ref 135–145)

## 2020-06-28 LAB — CBC WITH DIFFERENTIAL/PLATELET
Abs Immature Granulocytes: 0.12 10*3/uL — ABNORMAL HIGH (ref 0.00–0.07)
Basophils Absolute: 0.1 10*3/uL (ref 0.0–0.1)
Basophils Relative: 1 %
Eosinophils Absolute: 0.1 10*3/uL (ref 0.0–0.5)
Eosinophils Relative: 1 %
HCT: 38.3 % — ABNORMAL LOW (ref 39.0–52.0)
Hemoglobin: 11.7 g/dL — ABNORMAL LOW (ref 13.0–17.0)
Immature Granulocytes: 1 %
Lymphocytes Relative: 7 %
Lymphs Abs: 0.9 10*3/uL (ref 0.7–4.0)
MCH: 23.4 pg — ABNORMAL LOW (ref 26.0–34.0)
MCHC: 30.5 g/dL (ref 30.0–36.0)
MCV: 76.8 fL — ABNORMAL LOW (ref 80.0–100.0)
Monocytes Absolute: 0.7 10*3/uL (ref 0.1–1.0)
Monocytes Relative: 5 %
Neutro Abs: 11.4 10*3/uL — ABNORMAL HIGH (ref 1.7–7.7)
Neutrophils Relative %: 85 %
Platelets: 280 10*3/uL (ref 150–400)
RBC: 4.99 MIL/uL (ref 4.22–5.81)
RDW: 16.7 % — ABNORMAL HIGH (ref 11.5–15.5)
WBC: 13.3 10*3/uL — ABNORMAL HIGH (ref 4.0–10.5)
nRBC: 0 % (ref 0.0–0.2)

## 2020-06-28 LAB — MAGNESIUM: Magnesium: 2 mg/dL (ref 1.7–2.4)

## 2020-06-28 NOTE — Plan of Care (Signed)
  Problem: Education: Goal: Knowledge of General Education information will improve Description: Including pain rating scale, medication(s)/side effects and non-pharmacologic comfort measures Outcome: Progressing   Problem: Health Behavior/Discharge Planning: Goal: Ability to manage health-related needs will improve Outcome: Progressing   Problem: Clinical Measurements: Goal: Ability to maintain clinical measurements within normal limits will improve Outcome: Progressing Goal: Diagnostic test results will improve Outcome: Progressing   Problem: Nutrition: Goal: Adequate nutrition will be maintained Outcome: Progressing   

## 2020-06-28 NOTE — Progress Notes (Signed)
PROGRESS NOTE  Troy Sanchez TIR:[id number] DOB: Sep 27, 1941 DOA: 06/24/2020 PCP: Deland Pretty, MD  Brief History   Troy Sanchez a 79 y.o.malewith medical history significant forpulmonary hypertension, hypertension, CAD, hyperlipidemia and GERD. Patient presented to the ED on 9/14 with 3-week history of diarrhea and progressive generalized weakness.  8/28, he went to an urgent care center and was prescribed antibiotic which did not help.  He tried over-the-counter antidiarrheals as well without much help.  Diarrhea progressed to be more in amount and frequency up to 4-6 times a day.  Also started having fever. 9/14, return to urgent care due to persistence of symptoms and was referred to ED.  In the ED, patient had a fever of one 1.1, tachycardic to 103, blood pressure maintained, breathing on room air.   Labs showed WBC count elevated to 22.3, creatinine elevated to 1.29. COVID-19 PCR negative. CT abdomen and pelvis with contrast was consistent with diffuse colitis. C. difficile assay sent on admission was positive. Patient was admitted under hospitalist service.   Consultants  . None  Procedures  . None  Antibiotics   Anti-infectives (From admission, onward)   Start     Dose/Rate Route Frequency Ordered Stop   06/25/20 0800  cefTRIAXone (ROCEPHIN) 2 g in sodium chloride 0.9 % 100 mL IVPB  Status:  Discontinued        2 g 200 mL/hr over 30 Minutes Intravenous Every 24 hours 06/24/20 2037 06/24/20 2324   06/25/20 0400  metroNIDAZOLE (FLAGYL) IVPB 500 mg  Status:  Discontinued        500 mg 100 mL/hr over 60 Minutes Intravenous Every 8 hours 06/24/20 2037 06/24/20 2324   06/24/20 2330  vancomycin (VANCOCIN) 50 mg/mL oral solution 125 mg        125 mg Oral 4 times daily 06/24/20 2323 07/04/20 2159   06/24/20 1945  cefTRIAXone (ROCEPHIN) 2 g in sodium chloride 0.9 % 100 mL IVPB       "And" Linked Group Details   2 g 200 mL/hr over 30 Minutes Intravenous  Once 06/24/20 1934  06/24/20 2320   06/24/20 1945  metroNIDAZOLE (FLAGYL) IVPB 500 mg       "And" Linked Group Details   500 mg 100 mL/hr over 60 Minutes Intravenous  Once 06/24/20 1934 06/25/20 0031    .  Subjective  The patient is resting comfortably. No new complaints.  Objective   Vitals:  Vitals:   06/28/20 0539 06/28/20 1341  BP: 108/74 97/72  Pulse: 70 74  Resp: (!) 21 20  Temp: 98 F (36.7 C) 97.6 F (36.4 C)  SpO2: 98% 100%   Exam:  Constitutional:  . The patient is awake, alert, and oriented x 3. No acute distress. Respiratory:  . No increased work of breathing. . No wheezes, rales, or rhonchi . No tactile fremitus Cardiovascular:  . Regular rate and rhythm . No murmurs, ectopy, or gallups. . No lateral PMI. No thrills. Abdomen:  . Abdomen is soft, non-tender, non-distended . No hernias, masses, or organomegaly . Normoactive bowel sounds.  Musculoskeletal:  . No cyanosis, clubbing, or edema Skin:  . No rashes, lesions, ulcers . palpation of skin: no induration or nodules Neurologic:  . CN 2-12 intact . Sensation all 4 extremities intact Psychiatric:  . Mental status o Mood, affect appropriate o Orientation to person, place, time  . judgment and insight appear intact  I have personally reviewed the following:   Today's Data  . Vitals, BMP, CBC  Micro Data  . C Diff antigen positive . C diff toxin positive  Imaging  . CT abdomen and pelvis: 06/24/2020  Scheduled Meds: . aspirin  81 mg Oral QODAY  . cholecalciferol  1,000 Units Oral BH-q7a  . feeding supplement  1 Container Oral TID BM  . heparin  5,000 Units Subcutaneous Q8H  . loperamide  2 mg Oral Q6H  . multivitamin with minerals  1 tablet Oral Daily  . pantoprazole  40 mg Oral Daily  . vancomycin  125 mg Oral QID   Continuous Infusions: . sodium chloride 100 mL/hr at 06/28/20 1545    Principal Problem:   Acute colitis Active Problems:   CAD (coronary artery disease)   HTN (hypertension)    Sepsis (HCC)   Diarrhea   Generalized weakness   Microcytic anemia   Leukocytosis   Serum total bilirubin elevated   Dehydration   Malnutrition of moderate degree   LOS: 4 days   A & P   Sepsis - POA/Diarrhea due to colitis from C. Difficile and norovirus: Presented with 3 weeks of diarrhea not responding to antibiotics and antidiarrheals. CT abdomen with diffuse colitis.  No perforation. C. difficile assay on admission positive.  GI pathogen panel also positive for norovirus. Currently on oral vancomycin, IV fluids.  No recurrence of fever in last 24 hours and WBC count trending down but patient continues to have significant amount of diarrhea. I will continue him on scheduled Imodium 2 mg every 6 hours for next 24 hours. Continue to monitor. May benefit from a Flexi-Seal because of persistent severe frequent watery diarrhea  AKI: Resolved. Due to volume depletion due to GI losses.  Hypokalemia/hypomagnesemia/hypophosphatemia: Low electrolytes levels because of GI losses. Continue to monitor and supplement as necessary.  Microcytic anemia: Iron deficiency with a normal ferritin level. Hemoglobin is stable. Supplement iron with feraheme.  Hypertension/pulmonary hypertension: Blood pressures are low. Home meds including Imdur 60 mg daily, amlodipine 5 mg daily are being held.   CAD: Continue aspirin and statin.  DVT prophylaxis: Heparin injection 5,000 Units Start: 06/24/20 2200 SCDs Start: 06/24/20 2037 Code Status:  Code Status: Full Code  Family Communication:None available. Disposition:  Status is: Inpatient  Remains inpatient appropriate because:Persistent severe electrolyte disturbances, Ongoing diagnostic testing needed not appropriate for outpatient work up and IV treatments appropriate due to intensity of illness or inability to take PO   Dispo: The patient is from: Home  Anticipated d/c is to: Home pending PT eval  Anticipated d/c date is:  2 days  Patient currently is not medically stable to d/c.  Fermin Yan, DO [facility] Direct contact: see [email or web address]  7PM-7AM contact night coverage as above 06/28/2020, 6:40 PM  LOS: 4 days

## 2020-06-29 LAB — BASIC METABOLIC PANEL
Anion gap: 8 (ref 5–15)
BUN: 11 mg/dL (ref 8–23)
CO2: 25 mmol/L (ref 22–32)
Calcium: 7.8 mg/dL — ABNORMAL LOW (ref 8.9–10.3)
Chloride: 102 mmol/L (ref 98–111)
Creatinine, Ser: 0.86 mg/dL (ref 0.61–1.24)
GFR calc Af Amer: >60 mL/min (ref >60)
GFR calc non Af Amer: >60 mL/min (ref >60)
Glucose, Bld: 112 mg/dL — ABNORMAL HIGH (ref 70–99)
Potassium: 3.3 mmol/L — ABNORMAL LOW (ref 3.5–5.1)
Sodium: 135 mmol/L (ref 135–145)

## 2020-06-29 LAB — CBC WITH DIFFERENTIAL/PLATELET
Abs Immature Granulocytes: 0.17 10*3/uL — ABNORMAL HIGH (ref 0.00–0.07)
Basophils Absolute: 0.1 10*3/uL (ref 0.0–0.1)
Basophils Relative: 1 %
Eosinophils Absolute: 0.2 10*3/uL (ref 0.0–0.5)
Eosinophils Relative: 2 %
HCT: 41.1 % (ref 39.0–52.0)
Hemoglobin: 12.5 g/dL — ABNORMAL LOW (ref 13.0–17.0)
Immature Granulocytes: 2 %
Lymphocytes Relative: 10 %
Lymphs Abs: 1.1 10*3/uL (ref 0.7–4.0)
MCH: 23.2 pg — ABNORMAL LOW (ref 26.0–34.0)
MCHC: 30.4 g/dL (ref 30.0–36.0)
MCV: 76.4 fL — ABNORMAL LOW (ref 80.0–100.0)
Monocytes Absolute: 0.8 10*3/uL (ref 0.1–1.0)
Monocytes Relative: 7 %
Neutro Abs: 9.1 10*3/uL — ABNORMAL HIGH (ref 1.7–7.7)
Neutrophils Relative %: 78 %
Platelets: 323 10*3/uL (ref 150–400)
RBC: 5.38 MIL/uL (ref 4.22–5.81)
RDW: 16.8 % — ABNORMAL HIGH (ref 11.5–15.5)
WBC: 11.4 10*3/uL — ABNORMAL HIGH (ref 4.0–10.5)
nRBC: 0 % (ref 0.0–0.2)

## 2020-06-29 MED ORDER — POTASSIUM CHLORIDE CRYS ER 20 MEQ PO TBCR
40.0000 meq | EXTENDED_RELEASE_TABLET | Freq: Once | ORAL | Status: AC
  Administered 2020-06-29: 40 meq via ORAL
  Filled 2020-06-29: qty 2

## 2020-06-29 NOTE — Progress Notes (Signed)
PROGRESS NOTE  Troy Sanchez BMW:[id number] DOB: 19-Nov-1940 DOA: 06/24/2020 PCP: Deland Pretty, MD  Brief History   Troy Sanchez a 79 y.o.malewith medical history significant forpulmonary hypertension, hypertension, CAD, hyperlipidemia and GERD. Patient presented to the ED on 9/14 with 3-week history of diarrhea and progressive generalized weakness.  8/28, he went to an urgent care center and was prescribed antibiotic which did not help.  He tried over-the-counter antidiarrheals as well without much help.  Diarrhea progressed to be more in amount and frequency up to 4-6 times a day.  Also started having fever. 9/14, return to urgent care due to persistence of symptoms and was referred to ED.  In the ED, patient had a fever of one 1.1, tachycardic to 103, blood pressure maintained, breathing on room air.   Labs showed WBC count elevated to 22.3, creatinine elevated to 1.29. COVID-19 PCR negative. CT abdomen and pelvis with contrast was consistent with diffuse colitis. C. difficile assay sent on admission was positive. Patient was admitted under hospitalist service.   Consultants  . None  Procedures  . None  Antibiotics   Anti-infectives (From admission, onward)   Start     Dose/Rate Route Frequency Ordered Stop   06/25/20 0800  cefTRIAXone (ROCEPHIN) 2 g in sodium chloride 0.9 % 100 mL IVPB  Status:  Discontinued        2 g 200 mL/hr over 30 Minutes Intravenous Every 24 hours 06/24/20 2037 06/24/20 2324   06/25/20 0400  metroNIDAZOLE (FLAGYL) IVPB 500 mg  Status:  Discontinued        500 mg 100 mL/hr over 60 Minutes Intravenous Every 8 hours 06/24/20 2037 06/24/20 2324   06/24/20 2330  vancomycin (VANCOCIN) 50 mg/mL oral solution 125 mg        125 mg Oral 4 times daily 06/24/20 2323 07/04/20 2159   06/24/20 1945  cefTRIAXone (ROCEPHIN) 2 g in sodium chloride 0.9 % 100 mL IVPB       "And" Linked Group Details   2 g 200 mL/hr over 30 Minutes Intravenous  Once 06/24/20 1934  06/24/20 2320   06/24/20 1945  metroNIDAZOLE (FLAGYL) IVPB 500 mg       "And" Linked Group Details   500 mg 100 mL/hr over 60 Minutes Intravenous  Once 06/24/20 1934 06/25/20 0031     Subjective  The patient is resting comfortably. No new complaints.  Objective   Vitals:  Vitals:   06/28/20 1341 06/28/20 2138  BP: 97/72 109/72  Pulse: 74 82  Resp: 20 18  Temp: 97.6 F (36.4 C) 98.4 F (36.9 C)  SpO2: 100% 94%   Exam:  Constitutional:  . The patient is awake, alert, and oriented x 3. No acute distress. Respiratory:  . No increased work of breathing. . No wheezes, rales, or rhonchi . No tactile fremitus Cardiovascular:  . Regular rate and rhythm . No murmurs, ectopy, or gallups. . No lateral PMI. No thrills. Abdomen:  . Abdomen is soft, non-tender, non-distended . No hernias, masses, or organomegaly . Normoactive bowel sounds.  Musculoskeletal:  . No cyanosis, clubbing, or edema Skin:  . No rashes, lesions, ulcers . palpation of skin: no induration or nodules Neurologic:  . CN 2-12 intact . Sensation all 4 extremities intact Psychiatric:  . Mental status o Mood, affect appropriate o Orientation to person, place, time  . judgment and insight appear intact  I have personally reviewed the following:   Today's Data  . Vitals, BMP, CBC  Micro  Data  . C Diff antigen positive . C diff toxin positive  Imaging  . CT abdomen and pelvis: 06/24/2020  Scheduled Meds: . aspirin  81 mg Oral QODAY  . cholecalciferol  1,000 Units Oral BH-q7a  . feeding supplement  1 Container Oral TID BM  . heparin  5,000 Units Subcutaneous Q8H  . loperamide  2 mg Oral Q6H  . multivitamin with minerals  1 tablet Oral Daily  . pantoprazole  40 mg Oral Daily  . vancomycin  125 mg Oral QID   Continuous Infusions: . sodium chloride 100 mL/hr at 06/28/20 1545    Principal Problem:   Acute colitis Active Problems:   CAD (coronary artery disease)   HTN (hypertension)   Sepsis  (HCC)   Diarrhea   Generalized weakness   Microcytic anemia   Leukocytosis   Serum total bilirubin elevated   Dehydration   Malnutrition of moderate degree   LOS: 5 days   A & P   Sepsis - POA/Diarrhea due to colitis from C. Difficile and norovirus: Presented with 3 weeks of diarrhea not responding to antibiotics and antidiarrheals. CT abdomen with diffuse colitis.  No perforation. C. difficile assay on admission positive.  GI pathogen panel also positive for norovirus. Currently on oral vancomycin, IV fluids.  No recurrence of fever in last 24 hours and WBC count trending down but patient continues to have significant amount of diarrhea. I will continue him on scheduled Imodium 2 mg every 6 hours for next 24 hours. Continue to monitor. May benefit from a Flexi-Seal because of persistent severe frequent watery diarrhea.  AKI: Resolved. Due to volume depletion due to GI losses.  Hypokalemia/hypomagnesemia/hypophosphatemia: Low electrolytes levels because of GI losses. Continue to monitor and supplement as necessary.  Microcytic anemia: Iron deficiency with a normal ferritin level. Hemoglobin is stable. Supplement iron with feraheme.  Hypertension/pulmonary hypertension: Blood pressures are low. Home meds including Imdur 60 mg daily, amlodipine 5 mg daily are being held.   CAD: Continue aspirin and statin.  DVT prophylaxis: Heparin injection 5,000 Units Start: 06/24/20 2200 SCDs Start: 06/24/20 2037 Code Status:  Code Status: Full Code  Family Communication:None available. Disposition:  Status is: Inpatient  Remains inpatient appropriate because:Persistent severe electrolyte disturbances, Ongoing diagnostic testing needed not appropriate for outpatient work up and IV treatments appropriate due to intensity of illness or inability to take PO   Dispo: The patient is from: Home  Anticipated d/c is to: Home pending PT eval  Anticipated d/c date is: 1  days  Patient currently is not medically stable to d/c.  Winry Egnew, DO [facility] Direct contact: see [email or web address]  7PM-7AM contact night coverage as above 06/29/2020, 12:39 PM  LOS: 4 days

## 2020-06-29 NOTE — Plan of Care (Signed)

## 2020-06-30 NOTE — Progress Notes (Signed)
PROGRESS NOTE  BENFORD ASCH RSW:[id number] DOB: 07-05-1941 DOA: 06/24/2020 PCP: Deland Pretty, MD  Brief History   [name] a 79 y.o.malewith medical history significant forpulmonary hypertension, hypertension, CAD, hyperlipidemia and GERD. Patient presented to the ED on 9/14 with 3-week history of diarrhea and progressive generalized weakness.  8/28, he went to an urgent care center and was prescribed antibiotic which did not help.  He tried over-the-counter antidiarrheals as well without much help.  Diarrhea progressed to be more in amount and frequency up to 4-6 times a day.  Also started having fever. 9/14, return to urgent care due to persistence of symptoms and was referred to ED.  In the ED, patient had a fever of one 1.1, tachycardic to 103, blood pressure maintained, breathing on room air.   Labs showed WBC count elevated to 22.3, creatinine elevated to 1.29. COVID-19 PCR negative. CT abdomen and pelvis with contrast was consistent with diffuse colitis. C. difficile assay sent on admission was positive. Patient was admitted under hospitalist service.   Consultants  . None  Procedures  . None  Antibiotics   Anti-infectives (From admission, onward)   Start     Dose/Rate Route Frequency Ordered Stop   06/25/20 0800  cefTRIAXone (ROCEPHIN) 2 g in sodium chloride 0.9 % 100 mL IVPB  Status:  Discontinued        2 g 200 mL/hr over 30 Minutes Intravenous Every 24 hours 06/24/20 2037 06/24/20 2324   06/25/20 0400  metroNIDAZOLE (FLAGYL) IVPB 500 mg  Status:  Discontinued        500 mg 100 mL/hr over 60 Minutes Intravenous Every 8 hours 06/24/20 2037 06/24/20 2324   06/24/20 2330  vancomycin (VANCOCIN) 50 mg/mL oral solution 125 mg        125 mg Oral 4 times daily 06/24/20 2323 07/04/20 2159   06/24/20 1945  cefTRIAXone (ROCEPHIN) 2 g in sodium chloride 0.9 % 100 mL IVPB       "And" Linked Group Details   2 g 200 mL/hr over 30 Minutes Intravenous  Once 06/24/20 1934  06/24/20 2320   06/24/20 1945  metroNIDAZOLE (FLAGYL) IVPB 500 mg       "And" Linked Group Details   500 mg 100 mL/hr over 60 Minutes Intravenous  Once 06/24/20 1934 06/25/20 0031     Subjective  The patient is resting comfortably. No new complaints.  Objective   Vitals:  Vitals:   06/29/20 2135 06/30/20 0622  BP: 111/80 110/71  Pulse: 78 80  Resp:  17  Temp: 97.9 F (36.6 C) 98.9 F (37.2 C)  SpO2: 94% 95%   Exam:  Constitutional:  . The patient is awake, alert, and oriented x 3. No acute distress. Respiratory:  . No increased work of breathing. . No wheezes, rales, or rhonchi . No tactile fremitus Cardiovascular:  . Regular rate and rhythm . No murmurs, ectopy, or gallups. . No lateral PMI. No thrills. Abdomen:  . Abdomen is soft, non-tender, non-distended . No hernias, masses, or organomegaly . Normoactive bowel sounds.  Musculoskeletal:  . No cyanosis, clubbing, or edema Skin:  . No rashes, lesions, ulcers . palpation of skin: no induration or nodules Neurologic:  . CN 2-12 intact . Sensation all 4 extremities intact Psychiatric:  . Mental status o Mood, affect appropriate o Orientation to person, place, time  . judgment and insight appear intact  I have personally reviewed the following:   Today's Data  . Vitals, BMP, CBC  Micro  Data  . C Diff antigen positive . C diff toxin positive  Imaging  . CT abdomen and pelvis: 06/24/2020  Scheduled Meds: . aspirin  81 mg Oral QODAY  . cholecalciferol  1,000 Units Oral BH-q7a  . feeding supplement  1 Container Oral TID BM  . heparin  5,000 Units Subcutaneous Q8H  . multivitamin with minerals  1 tablet Oral Daily  . pantoprazole  40 mg Oral Daily  . vancomycin  125 mg Oral QID   Continuous Infusions: . sodium chloride 100 mL/hr at 06/28/20 1545    Principal Problem:   Acute colitis Active Problems:   CAD (coronary artery disease)   HTN (hypertension)   Sepsis (HCC)   Diarrhea   Generalized  weakness   Microcytic anemia   Leukocytosis   Serum total bilirubin elevated   Dehydration   Malnutrition of moderate degree   LOS: 6 days   A & P   Sepsis - POA/Diarrhea due to colitis from C. Difficile and norovirus: Presented with 3 weeks of diarrhea not responding to antibiotics and antidiarrheals. CT abdomen with diffuse colitis.  No perforation. C. difficile assay on admission positive.  GI pathogen panel also positive for norovirus. Currently on oral vancomycin, IV fluids.  No recurrence of fever in last 24 hours and WBC count trending down but patient continues to have significant amount of diarrhea. I will continue him on scheduled Imodium 2 mg every 6 hours for next 24 hours. Continue to monitor. May benefit from a Flexi-Seal because of persistent severe frequent watery diarrhea.  AKI: Resolved. Due to volume depletion due to GI losses.  Hypokalemia/hypomagnesemia/hypophosphatemia: Low electrolytes levels because of GI losses. Continue to monitor and supplement as necessary.  Microcytic anemia: Iron deficiency with a normal ferritin level. Hemoglobin is stable. Supplement iron with feraheme.  Hypertension/pulmonary hypertension: Blood pressures are low. Home meds including Imdur 60 mg daily, amlodipine 5 mg daily are being held.   CAD: Continue aspirin and statin.  DVT prophylaxis: Heparin injection 5,000 Units Start: 06/24/20 2200 SCDs Start: 06/24/20 2037 Code Status:  Code Status: Full Code  Family Communication:None available. Disposition:  Status is: Inpatient  Remains inpatient appropriate because:Persistent severe electrolyte disturbances, Ongoing diagnostic testing needed not appropriate for outpatient work up and IV treatments appropriate due to intensity of illness or inability to take PO   Dispo: The patient is from: Home  Anticipated d/c is to: Home vs rehab pending PT   Re-eval  Anticipated d/c date is: 1 days   Patient currently is not medically stable to d/c.  Hurbert Duran, DO [facility] Direct contact: see [email or web address]  7PM-7AM contact night coverage as above 06/30/2020, 3:49 PM  LOS: 4 days

## 2020-07-01 MED ORDER — LOPERAMIDE HCL 2 MG PO CAPS: 2.0000 mg | ORAL_CAPSULE | ORAL | Status: DC | PRN

## 2020-07-01 MED ORDER — LOPERAMIDE HCL 2 MG PO CAPS
4.0000 mg | ORAL_CAPSULE | Freq: Every morning | ORAL | Status: DC
  Administered 2020-07-02 – 2020-07-04 (×3): 4 mg via ORAL
  Filled 2020-07-01 (×3): qty 2

## 2020-07-01 NOTE — Progress Notes (Signed)
PROGRESS NOTE  Troy Sanchez OQH:[id number] DOB: [birth year] DOA: 06/24/2020 PCP: Deland Pretty, MD  Brief History   Troy Sanchez a 79 y.o.malewith medical history significant forpulmonary hypertension, hypertension, CAD, hyperlipidemia and GERD. Patient presented to the ED on 9/14 with 3-week history of diarrhea and progressive generalized weakness.  8/28, he went to an urgent care center and was prescribed antibiotic which did not help.  He tried over-the-counter antidiarrheals as well without much help.  Diarrhea progressed to be more in amount and frequency up to 4-6 times a day.  Also started having fever. 9/14, return to urgent care due to persistence of symptoms and was referred to ED.  In the ED, patient had a fever of one 1.1, tachycardic to 103, blood pressure maintained, breathing on room air.  Labs showed WBC count elevated to 22.3, creatinine elevated to 1.29. COVID-19 PCR negative. CT abdomen and pelvis with contrast was consistent with diffuse colitis. C. difficile assay sent on admission was positive. Patient was admitted under hospitalist service.   Consultants  . None  Procedures  . None  Antibiotics   Anti-infectives (From admission, onward)   Start     Dose/Rate Route Frequency Ordered Stop   06/25/20 0800  cefTRIAXone (ROCEPHIN) 2 g in sodium chloride 0.9 % 100 mL IVPB  Status:  Discontinued        2 g 200 mL/hr over 30 Minutes Intravenous Every 24 hours 06/24/20 2037 06/24/20 2324   06/25/20 0400  metroNIDAZOLE (FLAGYL) IVPB 500 mg  Status:  Discontinued        500 mg 100 mL/hr over 60 Minutes Intravenous Every 8 hours 06/24/20 2037 06/24/20 2324   06/24/20 2330  vancomycin (VANCOCIN) 50 mg/mL oral solution 125 mg        125 mg Oral 4 times daily 06/24/20 2323 07/04/20 2159   06/24/20 1945  cefTRIAXone (ROCEPHIN) 2 g in sodium chloride 0.9 % 100 mL IVPB       "And" Linked Group Details   2 g 200 mL/hr over 30 Minutes Intravenous  Once 06/24/20 1934  06/24/20 2320   06/24/20 1945  metroNIDAZOLE (FLAGYL) IVPB 500 mg       "And" Linked Group Details   500 mg 100 mL/hr over 60 Minutes Intravenous  Once 06/24/20 1934 06/25/20 0031     Subjective  The patient is resting comfortably. Although no stools were recorded for 06/30/2020 and only one for 06/29/2020, the patient asserts and presents documentation that he is having 8-9 stools a day. He stated that he had had 3 by 10:00 am this morning (the time of my visit).  Objective   Vitals:  Vitals:   06/30/20 2045 07/01/20 0512  BP: 110/68 122/61  Pulse: 79 75  Resp: 16 16  Temp: 98.4 F (36.9 C) 99.1 F (37.3 C)  SpO2: 99% 90%   Exam:  Constitutional:  . The patient is awake, alert, and oriented x 3. No acute distress. Respiratory:  . No increased work of breathing. . No wheezes, rales, or rhonchi . No tactile fremitus Cardiovascular:  . Regular rate and rhythm . No murmurs, ectopy, or gallups. . No lateral PMI. No thrills. Abdomen:  . Abdomen is soft, non-tender, non-distended . No hernias, masses, or organomegaly . Normoactive bowel sounds.  Musculoskeletal:  . No cyanosis, clubbing, or edema Skin:  . No rashes, lesions, ulcers . palpation of skin: no induration or nodules Neurologic:  . CN 2-12 intact . Sensation all 4 extremities intact Psychiatric:  .  Mental status o Mood, affect appropriate o Orientation to person, place, time  . judgment and insight appear intact  I have personally reviewed the following:   Today's Data  . Orthoptist  . C Diff antigen positive . C diff toxin positive  Imaging  . CT abdomen and pelvis: 06/24/2020  Scheduled Meds: . aspirin  81 mg Oral QODAY  . cholecalciferol  1,000 Units Oral BH-q7a  . feeding supplement  1 Container Oral TID BM  . heparin  5,000 Units Subcutaneous Q8H  . [START ON 07/02/2020] loperamide  4 mg Oral q AM  . multivitamin with minerals  1 tablet Oral Daily  . pantoprazole  40 mg Oral Daily    . vancomycin  125 mg Oral QID   Continuous Infusions: . sodium chloride 100 mL/hr at 06/30/20 2321    Principal Problem:   Acute colitis Active Problems:   CAD (coronary artery disease)   HTN (hypertension)   Sepsis (HCC)   Diarrhea   Generalized weakness   Microcytic anemia   Leukocytosis   Serum total bilirubin elevated   Dehydration   Malnutrition of moderate degree   LOS: 7 days   A & P   Sepsis -Resolved.  POA/Diarrhea due to colitis from C. Difficile and norovirus: Presented with 3 weeks of diarrhea not responding to antibiotics and antidiarrheals. CT abdomen with diffuse colitis.  No perforation. C. difficile assay on admission positive.  GI pathogen panel also positive for norovirus. Currently on oral vancomycin, IV fluids.  No recurrence of fever in last 24 hours and WBC count trending down but patient continues to have significant amount of diarrhea by his report. I have added back imodium, and discussed with nursing improved reporting of numbers of stools. Will follow chemistry to determine whether or not diarrhea remains so severe that the patient requires inpatient monitoring and care. Continue to monitor.  AKI: Resolved. Due to volume depletion due to GI losses.  Hypokalemia/hypomagnesemia/hypophosphatemia: Low electrolytes levels because of GI losses. Continue to monitor and supplement as necessary.  Microcytic anemia: Iron deficiency with a normal ferritin level. Hemoglobin is stable. Supplement iron with feraheme.  Hypertension/pulmonary hypertension: Blood pressures are low. Home meds including Imdur 60 mg daily, amlodipine 5 mg daily are being held.   CAD: Continue aspirin and statin.  I have seen and examined this patient myself. I have spent 34 minutes in his evaluation and care.  DVT prophylaxis: Heparin injection 5,000 Units Start: 06/24/20 2200 SCDs Start: 06/24/20 2037 Code Status:  Code Status: Full Code  Family Communication:None  available. Disposition:  Status is: Inpatient  Remains inpatient appropriate because:Persistent severe electrolyte disturbances, Ongoing diagnostic testing needed not appropriate for outpatient work up and IV treatments appropriate due to intensity of illness or inability to take PO   Dispo: The patient is from: Home  Anticipated d/c is to: Home vs rehab pending PT   Re-eval  Anticipated d/c date is: 1 days  Patient currently is not medically stable to d/c.  Clarissia Mckeen, DO [facility] Direct contact: see [email or web address]  7PM-7AM contact night coverage as above 07/01/2020, 4:15 PM  LOS: 4 days

## 2020-07-01 NOTE — Progress Notes (Signed)
Physical Therapy Treatment Patient Details Name: Troy Sanchez MRN: [id number] DOB: 1940-10-22 Today's Date: 07/01/2020    History of Present Illness Troy Sanchez is a 79 y.o. male with medical history significant for pulmonary hypertension, hypertension, CAD, hyperlipidemia and GERD who presents to the emergency department due to 3-week onset of diarrhea and generalized weakness.  Patient states that he has been to urgent care twice prior to returning today, he states that he was prescribed an antibiotic due to the diarrhea when he first went around August 28th (he was unable to remember the name of antibiotic) and there was no improvement in diarrhea despite the antibiotic.  Diarrhea was watery in nature and was 4-6 episodes daily, he endorsed loss of sense of taste shortly after onset of diarrhea and he also complained of fever.  Patient states that he has been taking over-the-counter antidiarrheal medication (he did not remember the name of medication) without improvement.  He returned to urgent care today due to persistence of symptoms and increased weakness and he was asked to go to the ED for further evaluation and management.  Patient denies abdominal pain, nausea, vomiting, chest pain, shortness of breath, he also denies any sick contact.    PT Comments    Patient demonstrates increased endurance/distance for ambulation with good return for using RW, no loss of balance and limited secondary to fatigue.  Patient demonstrates good return for completing BLE ROM/strengthening exercises with verbal cues and demonstration while seated at bedside.  Patient will benefit from continued physical therapy in hospital and recommended venue below to increase strength, balance, endurance for safe ADLs and gait.    Follow Up Recommendations  Home health PT;Supervision - Intermittent     Equipment Recommendations  None recommended by PT    Recommendations for Other Services       Precautions /  Restrictions Precautions Precautions: Fall Restrictions Weight Bearing Restrictions: No    Mobility  Bed Mobility Overal bed mobility: Modified Independent                Transfers Overall transfer level: Modified independent                  Ambulation/Gait Ambulation/Gait assistance: Supervision Gait Distance (Feet): 135 Feet Assistive device: Rolling walker (2 wheeled) Gait Pattern/deviations: Decreased step length - right;Decreased step length - left;Decreased stride length Gait velocity: decreased   General Gait Details: unsteady on feet when taking steps without AD, used RW for safety and patient demonstrates increased endurance/distance for ambulation withut loss of balance   Stairs             Wheelchair Mobility    Modified Rankin (Stroke Patients Only)       Balance Overall balance assessment: Needs assistance Sitting-balance support: Feet supported;No upper extremity supported Sitting balance-Leahy Scale: Good Sitting balance - Comments: sitting EOB   Standing balance support: During functional activity;No upper extremity supported Standing balance-Leahy Scale: Fair Standing balance comment: fair/good using RW                            Cognition Arousal/Alertness: Awake/alert Behavior During Therapy: WFL for tasks assessed/performed Overall Cognitive Status: Within Functional Limits for tasks assessed                                        Exercises General Exercises -  Lower Extremity Long Arc Quad: Seated;AROM;Strengthening;Both;10 reps Hip Flexion/Marching: Seated;AROM;Strengthening;10 reps Toe Raises: Seated;AROM;Strengthening;10 reps Heel Raises: Seated;AROM;Strengthening;10 reps    General Comments        Pertinent Vitals/Pain Pain Assessment: No/denies pain    Home Living                      Prior Function            PT Goals (current goals can now be found in the care  plan section) Acute Rehab PT Goals Patient Stated Goal: return home with family to assist PT Goal Formulation: With patient Time For Goal Achievement: 07/03/20 Potential to Achieve Goals: Good Progress towards PT goals: Progressing toward goals    Frequency    Min 3X/week      PT Plan Current plan remains appropriate    Co-evaluation              AM-PAC PT "6 Clicks" Mobility   Outcome Measure  Help needed turning from your back to your side while in a flat bed without using bedrails?: None Help needed moving from lying on your back to sitting on the side of a flat bed without using bedrails?: None Help needed moving to and from a bed to a chair (including a wheelchair)?: None Help needed standing up from a chair using your arms (e.g., wheelchair or bedside chair)?: None Help needed to walk in hospital room?: A Little Help needed climbing 3-5 steps with a railing? : A Little 6 Click Score: 22    End of Session   Activity Tolerance: Patient tolerated treatment well;Patient limited by fatigue Patient left: in bed;with call bell/phone within reach Nurse Communication: Mobility status PT Visit Diagnosis: Unsteadiness on feet (R26.81);Other abnormalities of gait and mobility (R26.89);Muscle weakness (generalized) (M62.81)     Time: 2229-7989 PT Time Calculation (min) (ACUTE ONLY): 24 min  Charges:  $Gait Training: 8-22 mins $Therapeutic Exercise: 8-22 mins                     4:03 PM, 07/01/20 Lonell Grandchild, MPT Physical Therapist with [phone] office [phone] mobile phone

## 2020-07-02 DIAGNOSIS — E440 Moderate protein-calorie malnutrition: Secondary | ICD-10-CM

## 2020-07-02 DIAGNOSIS — R609 Edema, unspecified: Secondary | ICD-10-CM

## 2020-07-02 LAB — BASIC METABOLIC PANEL
Anion gap: 8 (ref 5–15)
BUN: 6 mg/dL — ABNORMAL LOW (ref 8–23)
CO2: 25 mmol/L (ref 22–32)
Calcium: 7.6 mg/dL — ABNORMAL LOW (ref 8.9–10.3)
Chloride: 103 mmol/L (ref 98–111)
Creatinine, Ser: 0.87 mg/dL (ref 0.61–1.24)
GFR calc Af Amer: >60 mL/min (ref >60)
GFR calc non Af Amer: >60 mL/min (ref >60)
Glucose, Bld: 101 mg/dL — ABNORMAL HIGH (ref 70–99)
Potassium: 3.1 mmol/L — ABNORMAL LOW (ref 3.5–5.1)
Sodium: 136 mmol/L (ref 135–145)

## 2020-07-02 MED ORDER — ADULT MULTIVITAMIN W/MINERALS CH: 1.0000 | ORAL_TABLET | Freq: Every day | ORAL | 0 refills | Status: DC

## 2020-07-02 MED ORDER — FUROSEMIDE 10 MG/ML IJ SOLN
20.0000 mg | Freq: Two times a day (BID) | INTRAMUSCULAR | Status: DC
  Administered 2020-07-02 – 2020-07-04 (×4): 20 mg via INTRAVENOUS
  Filled 2020-07-02 (×4): qty 2

## 2020-07-02 MED ORDER — FUROSEMIDE 10 MG/ML IJ SOLN
INTRAMUSCULAR | Status: AC
  Administered 2020-07-02: 20 mg via INTRAVENOUS
  Filled 2020-07-02: qty 4

## 2020-07-02 MED ORDER — POTASSIUM CHLORIDE CRYS ER 20 MEQ PO TBCR
40.0000 meq | EXTENDED_RELEASE_TABLET | Freq: Once | ORAL | Status: AC
  Administered 2020-07-02: 40 meq via ORAL
  Filled 2020-07-02: qty 2

## 2020-07-02 MED ORDER — VANCOMYCIN HCL 125 MG PO CAPS: 125.0000 mg | ORAL_CAPSULE | Freq: Four times a day (QID) | ORAL | 0 refills | Status: DC

## 2020-07-02 NOTE — TOC Progression Note (Signed)
Transition of Care [facility]) - Progression Note    Patient Details  Name: TYRELLE RACZKA MRN: [id number] Date of Birth: [birth year]  Transition of Care [facility]) CM/SW Contact  Shade Flood, LCSW Phone Number: 07/02/2020, 9:19 AM  Clinical Narrative:     TOC following. Spoke with pt today to discuss dc planning. Pt reports that he wants to see the doctor this AM as he has a few concerns. Discussed PT recommendation for [facility] PT with pt who states that he doesn't think he needs that.   Updated MD of pt's request to speak with her.  Will update Corey at [place] as pt was previously referred there for [facility] at dc.  Expected Discharge Plan: Home/Self Care Barriers to Discharge: Continued Medical Work up  Expected Discharge Plan and Services Expected Discharge Plan: Home/Self Care         Expected Discharge Date: 07/02/20                         HH Arranged: PT [place]: [place] Date [place]: 06/27/20   Representative spoke with at [place]: [place] Interventions    Readmission Risk Interventions No flowsheet data found.

## 2020-07-02 NOTE — Progress Notes (Signed)
PROGRESS NOTE  MUSAB WINGARD JIR:[id number] DOB: 07-10-1941 DOA: 06/24/2020 PCP: Deland Pretty, MD  Brief History   [name] a 79 y.o.malewith medical history significant forpulmonary hypertension, hypertension, CAD, hyperlipidemia and GERD. Patient presented to the ED on 9/14 with 3-week history of diarrhea and progressive generalized weakness.  8/28, he went to an urgent care center and was prescribed antibiotic which did not help.  He tried over-the-counter antidiarrheals as well without much help.  Diarrhea progressed to be more in amount and frequency up to 4-6 times a day.  Also started having fever. 9/14, return to urgent care due to persistence of symptoms and was referred to ED.  In the ED, patient had a fever of one 1.1, tachycardic to 103, blood pressure maintained, breathing on room air.  Labs showed WBC count elevated to 22.3, creatinine elevated to 1.29. COVID-19 PCR negative. CT abdomen and pelvis with contrast was consistent with diffuse colitis. C. difficile assay sent on admission was positive. Patient was admitted under hospitalist service.   Consultants  . None  Procedures  . None  Antibiotics   Anti-infectives (From admission, onward)   Start     Dose/Rate Route Frequency Ordered Stop   07/02/20 0000  vancomycin (VANCOCIN) 125 MG capsule        125 mg Oral 4 times daily 07/02/20 0909     06/25/20 0800  cefTRIAXone (ROCEPHIN) 2 g in sodium chloride 0.9 % 100 mL IVPB  Status:  Discontinued        2 g 200 mL/hr over 30 Minutes Intravenous Every 24 hours 06/24/20 2037 06/24/20 2324   06/25/20 0400  metroNIDAZOLE (FLAGYL) IVPB 500 mg  Status:  Discontinued        500 mg 100 mL/hr over 60 Minutes Intravenous Every 8 hours 06/24/20 2037 06/24/20 2324   06/24/20 2330  vancomycin (VANCOCIN) 50 mg/mL oral solution 125 mg        125 mg Oral 4 times daily 06/24/20 2323 07/04/20 2159   06/24/20 1945  cefTRIAXone (ROCEPHIN) 2 g in sodium chloride 0.9 % 100 mL  IVPB       "And" Linked Group Details   2 g 200 mL/hr over 30 Minutes Intravenous  Once 06/24/20 1934 06/24/20 2320   06/24/20 1945  metroNIDAZOLE (FLAGYL) IVPB 500 mg       "And" Linked Group Details   500 mg 100 mL/hr over 60 Minutes Intravenous  Once 06/24/20 1934 06/25/20 0031     Subjective  The patient is resting comfortably in chair. Although 3 stools were recorded for 07/01/2020, the patient asserts and presents documentation that he is having 8-9 stools a day. He states that the stools are very watery. He is also complaining of swelling of his scrotum and penis.I have asked him to report his stools to nursing instead of writing them down.  Objective   Vitals:  Vitals:   07/02/20 0645 07/02/20 1351  BP: 111/74 106/66  Pulse: 76 79  Resp: 17 18  Temp: 99.2 F (37.3 C) 98.4 F (36.9 C)  SpO2: 95% 100%   Exam:  Constitutional:  . The patient is awake, alert, and oriented x 3. No acute distress. Respiratory:  . No increased work of breathing. . No wheezes, rales, or rhonchi . No tactile fremitus Cardiovascular:  . Regular rate and rhythm . No murmurs, ectopy, or gallups. . No lateral PMI. No thrills. Abdomen:  . Abdomen is soft, non-tender, non-distended . No hernias, masses, or organomegaly .  Normoactive bowel sounds.  Musculoskeletal:  . No cyanosis or clubbing . Positive for edema of lower extremities bilaterally Skin:  . No rashes, lesions, ulcers . palpation of skin: no induration or nodules Neurologic:  . CN 2-12 intact . Sensation all 4 extremities intact Psychiatric:  . Mental status o Mood, affect appropriate o Orientation to person, place, time  . judgment and insight appear intact  I have personally reviewed the following:   Today's Data  . Vitals, BMP  Micro Data  . C Diff antigen positive . C diff toxin positive  Imaging  . CT abdomen and pelvis: 06/24/2020  Scheduled Meds: . aspirin  81 mg Oral QODAY  . cholecalciferol  1,000  Units Oral BH-q7a  . feeding supplement  1 Container Oral TID BM  . furosemide  20 mg Intravenous Q12H  . heparin  5,000 Units Subcutaneous Q8H  . loperamide  4 mg Oral q AM  . multivitamin with minerals  1 tablet Oral Daily  . pantoprazole  40 mg Oral Daily  . vancomycin  125 mg Oral QID   Continuous Infusions:   Principal Problem:   Acute colitis Active Problems:   CAD (coronary artery disease)   HTN (hypertension)   Sepsis (HCC)   Diarrhea   Generalized weakness   Microcytic anemia   Leukocytosis   Serum total bilirubin elevated   Dehydration   Malnutrition of moderate degree   LOS: 8 days   A & P   Sepsis -Resolved.  POA/Diarrhea due to colitis from C. Difficile and norovirus: Presented with 3 weeks of diarrhea not responding to antibiotics and antidiarrheals. CT abdomen with diffuse colitis.  No perforation. C. difficile assay on admission positive.  GI pathogen panel also positive for norovirus. Currently on oral vancomycin, IV fluids.  No recurrence of fever in last 24 hours and WBC count trending down but patient continues to have significant amount of diarrhea by his report. I have added back imodium, and discussed with nursing improved reporting of numbers of stools. Will follow chemistry to determine whether or not diarrhea remains so severe that the patient requires inpatient monitoring and care. Continue to monitor.  AKI: Resolved. Due to volume depletion due to GI losses.  Edema: Scrotum, penis and lower extremities. IV fluids have been discontinued and lasix started. Last echocardiogram was obtained in 10/2017. I will order another echocardiogram given edema and low blood pressures.  Hypokalemia/hypomagnesemia/hypophosphatemia:Supplement and monitor.  Microcytic anemia: Iron deficiency with a normal ferritin level. Hemoglobin is stable. Supplement iron with feraheme.  Hypertension/pulmonary hypertension: Blood pressures are low. Home meds including Imdur 60 mg  daily, amlodipine 5 mg daily are being held. The patient has been receiving IV fluids due to marginal blood pressures, but now these have been stopped due to the development of edema. Lasix has been started, although it is not known how the patient will tolerate diuresis with his low baseline blood pressures.  CAD: Continue aspirin and statin.  I have seen and examined this patient myself. I have spent 34 minutes in his evaluation and care.  DVT prophylaxis: Heparin injection 5,000 Units Start: 06/24/20 2200 SCDs Start: 06/24/20 2037 Code Status:  Code Status: Full Code  Family Communication:None available. Disposition:  Status is: Inpatient  Remains inpatient appropriate because:Persistent severe electrolyte disturbances, Ongoing diagnostic testing needed not appropriate for outpatient work up and IV treatments appropriate due to intensity of illness or inability to take PO   Dispo: The patient is from: Home  Anticipated d/c  is to: Home vs rehab pending PT   Re-eval  Anticipated d/c date is: 1 days  Patient currently is not medically stable to d/c.  Cena Bruhn, DO [facility] Direct contact: see [email or web address]  7PM-7AM contact night coverage as above 07/02/2020, 4:42 PM  LOS: 4 days

## 2020-07-02 NOTE — Progress Notes (Signed)
Physical Therapy Treatment Patient Details Name: Troy Sanchez MRN: [id number] DOB: 07-07-1941 Today's Date: 07/02/2020    History of Present Illness Troy Sanchez is a 79 y.o. male with medical history significant for pulmonary hypertension, hypertension, CAD, hyperlipidemia and GERD who presents to the emergency department due to 3-week onset of diarrhea and generalized weakness.  Patient states that he has been to urgent care twice prior to returning today, he states that he was prescribed an antibiotic due to the diarrhea when he first went around August 28th (he was unable to remember the name of antibiotic) and there was no improvement in diarrhea despite the antibiotic.  Diarrhea was watery in nature and was 4-6 episodes daily, he endorsed loss of sense of taste shortly after onset of diarrhea and he also complained of fever.  Patient states that he has been taking over-the-counter antidiarrheal medication (he did not remember the name of medication) without improvement.  He returned to urgent care today due to persistence of symptoms and increased weakness and he was asked to go to the ED for further evaluation and management.  Patient denies abdominal pain, nausea, vomiting, chest pain, shortness of breath, he also denies any sick contact.    PT Comments    Pt tolerates ambulation without AD, 1 minor LOB and able to recover independently. Pt able to perform seated exercises with increased reps and denies pain or SOB with mobility. Educated pt on performing exercises throughout the day and encouraged pt to ambulate with nursing staff when able. Patient will benefit from continued physical therapy in hospital and recommendations below to increase strength, balance, endurance for safe ADLs and gait.    Follow Up Recommendations  Home health PT;Supervision - Intermittent     Equipment Recommendations  None recommended by PT    Recommendations for Other Services       Precautions /  Restrictions Precautions Precautions: None Restrictions Weight Bearing Restrictions: No    Mobility  Bed Mobility  General bed mobility comments: in chair upon arrival  Transfers Overall transfer level: Independent Equipment used: None   Ambulation/Gait Ambulation/Gait assistance: Supervision Gait Distance (Feet): 200 Feet Assistive device: None Gait Pattern/deviations: WFL(Within Functional Limits);Wide base of support Gait velocity: slightly decreased   General Gait Details: pt with slightly increased BOS, ambulates without AD with 1 slight LOB but able to recover independently, denies SOB/pain/fatigue with ambulation   Stairs             Wheelchair Mobility    Modified Rankin (Stroke Patients Only)       Balance Overall balance assessment: No apparent balance deficits (not formally assessed)           Cognition Arousal/Alertness: Awake/alert Behavior During Therapy: WFL for tasks assessed/performed Overall Cognitive Status: Within Functional Limits for tasks assessed       Exercises General Exercises - Lower Extremity Long Arc Quad: Seated;AROM;Strengthening;Both;15 reps Hip Flexion/Marching: Seated;AROM;Strengthening;15 reps    General Comments        Pertinent Vitals/Pain Pain Assessment: No/denies pain    Home Living               Prior Function            PT Goals (current goals can now be found in the care plan section) Acute Rehab PT Goals Patient Stated Goal: return home with family to assist PT Goal Formulation: With patient Time For Goal Achievement: 07/03/20 Potential to Achieve Goals: Good Progress towards PT goals: Progressing toward goals  Frequency    Min 3X/week      PT Plan Current plan remains appropriate    Co-evaluation              AM-PAC PT "6 Clicks" Mobility   Outcome Measure  Help needed turning from your back to your side while in a flat bed without using bedrails?: None Help needed  moving from lying on your back to sitting on the side of a flat bed without using bedrails?: None Help needed moving to and from a bed to a chair (including a wheelchair)?: None Help needed standing up from a chair using your arms (e.g., wheelchair or bedside chair)?: None Help needed to walk in hospital room?: None Help needed climbing 3-5 steps with a railing? : A Little 6 Click Score: 23    End of Session   Activity Tolerance: Patient tolerated treatment well Patient left: in bed;with call bell/phone within reach Nurse Communication: Mobility status PT Visit Diagnosis: Unsteadiness on feet (R26.81);Other abnormalities of gait and mobility (R26.89);Muscle weakness (generalized) (M62.81)     Time: 0634-9494 PT Time Calculation (min) (ACUTE ONLY): 11 min  Charges:  $Therapeutic Exercise: 8-22 mins                      Talbot Grumbling PT, DPT 07/02/20, 11:33 AM [phone]

## 2020-07-02 NOTE — Care Management Important Message (Signed)
Important Message  Patient Details  Name: Troy Sanchez MRN: [id number] Date of Birth: [birth year]   Medicare Important Message Given:  Yes     Tommy Medal 07/02/2020, 10:04 AM

## 2020-07-03 ENCOUNTER — Inpatient Hospital Stay (HOSPITAL_COMMUNITY): Payer: Medicare Other

## 2020-07-03 DIAGNOSIS — I5021 Acute systolic (congestive) heart failure: Secondary | ICD-10-CM

## 2020-07-03 LAB — BASIC METABOLIC PANEL
Anion gap: 7 (ref 5–15)
BUN: 5 mg/dL — ABNORMAL LOW (ref 8–23)
CO2: 29 mmol/L (ref 22–32)
Calcium: 7.8 mg/dL — ABNORMAL LOW (ref 8.9–10.3)
Chloride: 99 mmol/L (ref 98–111)
Creatinine, Ser: 0.97 mg/dL (ref 0.61–1.24)
GFR calc Af Amer: >60 mL/min (ref >60)
GFR calc non Af Amer: >60 mL/min (ref >60)
Glucose, Bld: 109 mg/dL — ABNORMAL HIGH (ref 70–99)
Potassium: 3.3 mmol/L — ABNORMAL LOW (ref 3.5–5.1)
Sodium: 135 mmol/L (ref 135–145)

## 2020-07-03 LAB — ECHOCARDIOGRAM COMPLETE
AR max vel: 2.87 cm2
AV Area VTI: 3.06 cm2
AV Area mean vel: 2.84 cm2
AV Mean grad: 4.5 mmHg
AV Peak grad: 9.2 mmHg
Ao pk vel: 1.52 m/s
Area-P 1/2: 3.12 cm2
Height: 70 in
S' Lateral: 2.79 cm
Weight: 3070.4 oz

## 2020-07-03 MED ORDER — POTASSIUM CHLORIDE CRYS ER 20 MEQ PO TBCR
40.0000 meq | EXTENDED_RELEASE_TABLET | ORAL | Status: AC
  Administered 2020-07-03 (×2): 40 meq via ORAL
  Filled 2020-07-03 (×2): qty 2

## 2020-07-03 NOTE — Plan of Care (Signed)
  Problem: Education: Goal: Knowledge of General Education information will improve Description Including pain rating scale, medication(s)/side effects and non-pharmacologic comfort measures Outcome: Progressing   Problem: Health Behavior/Discharge Planning: Goal: Ability to manage health-related needs will improve Outcome: Progressing   

## 2020-07-03 NOTE — Progress Notes (Addendum)
PROGRESS NOTE    Troy Sanchez  HRC:[id number] DOB: 1940-11-07 DOA: 06/24/2020 PCP: Deland Pretty, MD   Brief Narrative:  Patient is a 79 year old male with history of pulmonary hypertension, hypertension, coronary disease, hyperlipidemia, GERD who presents to the emergency department with complaints of 3-week history of diarrhea, progressive generalized weakness.  On 8/28, he went to an urgent care and was prescribed antibiotic which did not help.  He tried over-the-counter antidiarrheals which did not help.  Also reported of having fever at home.  On presentation he was febrile, tachycardic.  He had elevated leukocytes, AKI.  CT abdomen/pelvis was consistent with diffuse colitis.  He was admitted and was managed for C. difficile colitis.  Diarrhea has finally slowed down.  Hospital course remarkable for bilateral lower extremity edema, echocardiogram pending.  Discharge planning tomorrow.  Assessment & Plan:   Principal Problem:   Acute colitis Active Problems:   CAD (coronary artery disease)   HTN (hypertension)   Sepsis (HCC)   Diarrhea   Generalized weakness   Microcytic anemia   Leukocytosis   Serum total bilirubin elevated   Dehydration   Malnutrition of moderate degree   Sepsis: Presented with fever, leukocytosis, AKI.  Secondary to C. difficile colitis.  Sepsis physiology has resolved.  Currently he is hemodynamically stable.  Blood cultures have been negative.  C. difficile colitis/norovirus: CT abdomen/pelvis showed diffuse colitis on presentation.  Denies any abdomen pain, nausea or vomiting today.  Abdomen is nondistended.  Diarrhea has almost stopped.  Continue vancomycin to complete 10 days course. GI pathogen also showed norovirus.  AKI: Resolved with IV fluids.  Leukocytosis: Resolved  Edema: Has bilateral lower extremity, penile, scrotal edema.  IV fluids have been discontinued.  Started on IV Lasix.  Echocardiogram pending.  Will change IV to oral Lasix  tomorrow depending upon the echo report.  Hypokalemia/hypomagnesemia/hypophosphatemia: Supplemented and being monitored.  Severe iron deficiency microcytic anemia: Low iron level.  Given IV iron.  History of hypertension/pulmonary hypertension: Follows with pulmonology.  Not on oxygen.  On Imdur, amlodipine.  Coronary artery disease: On aspirin and statin.  Debility/deconditioning: PT evaluated the patient and recommended home health on discharge.    Nutrition Problem: Moderate Malnutrition Etiology: acute illness (Norovirus)      DVT prophylaxis:Heparin [place] Code Status: Full Family Communication: None to the bedside Status is: Inpatient  Remains inpatient appropriate because:IV treatments appropriate due to intensity of illness or inability to take PO   Dispo: The patient is from: Home              Anticipated d/c is to: Home              Anticipated d/c date is: 1 day              Patient currently is not medically stable to d/c.    Still has significant bilateral lower extremity edema with IV Lasix.  Echo pending.  Consultants:   Procedures:  Antimicrobials:  Anti-infectives (From admission, onward)   Start     Dose/Rate Route Frequency Ordered Stop   07/02/20 0000  vancomycin (VANCOCIN) 125 MG capsule        125 mg Oral 4 times daily 07/02/20 0909     06/25/20 0800  cefTRIAXone (ROCEPHIN) 2 g in sodium chloride 0.9 % 100 mL IVPB  Status:  Discontinued        2 g 200 mL/hr over 30 Minutes Intravenous Every 24 hours 06/24/20 2037 06/24/20 2324   06/25/20 0400  metroNIDAZOLE (FLAGYL) IVPB 500 mg  Status:  Discontinued        500 mg 100 mL/hr over 60 Minutes Intravenous Every 8 hours 06/24/20 2037 06/24/20 2324   06/24/20 2330  vancomycin (VANCOCIN) 50 mg/mL oral solution 125 mg        125 mg Oral 4 times daily 06/24/20 2323 07/04/20 2159   06/24/20 1945  cefTRIAXone (ROCEPHIN) 2 g in sodium chloride 0.9 % 100 mL IVPB       "And" Linked Group Details   2 g 200  mL/hr over 30 Minutes Intravenous  Once 06/24/20 1934 06/24/20 2320   06/24/20 1945  metroNIDAZOLE (FLAGYL) IVPB 500 mg       "And" Linked Group Details   500 mg 100 mL/hr over 60 Minutes Intravenous  Once 06/24/20 1934 06/25/20 0031      Subjective:  Patient seen and examined at the bedside this afternoon.  Sitting on the chair.  Comfortable.  He had to bowel movements since this morning which were formed.  Denies any abdominal pain, nausea or vomiting.  Feels much better.  He still has bilateral significant lower extremity edema and had few crackles on bilateral bases.  Objective: Vitals:   07/02/20 0645 07/02/20 1351 07/02/20 2115 07/03/20 0656  BP: 111/74 106/66 105/73 115/71  Pulse: 76 79 80 81  Resp: 17 18 17 17   Temp: 99.2 F (37.3 C) 98.4 F (36.9 C) 99.3 F (37.4 C) 98.9 F (37.2 C)  TempSrc:  Oral  Oral  SpO2: 95% 100% 98% 92%  Weight:      Height:       No intake or output data in the 24 hours ending 07/03/20 1408 Filed Weights   06/25/20 1527  Weight: 87 kg    Examination:  General exam: Appears calm and comfortable ,Not in distress, pleasant elderly male HEENT:PERRL,Oral mucosa moist, Ear/Nose normal on gross exam Respiratory system: Bilateral equal air entry, few crackles in bilateral bases  cardiovascular system: S1 & S2 heard, RRR. No JVD, murmurs, rubs, gallops or clicks. No pedal edema. Gastrointestinal system: Abdomen is nondistended, soft and nontender. No organomegaly or masses felt. Normal bowel sounds heard. Central nervous system: Alert and oriented. No focal neurological deficits. Extremities: 2-3+ bilateral lower extremity pitting edema, no clubbing ,no cyanosis, distal peripheral pulses palpable. Skin: No rashes, lesions or ulcers,no icterus ,no pallor     Data Reviewed: I have personally reviewed following labs and imaging studies  CBC: Recent Labs  Lab 06/27/20 0838 06/28/20 0646 06/29/20 0749  WBC 18.2* 13.3* 11.4*  NEUTROABS  16.5* 11.4* 9.1*  HGB 11.0* 11.7* 12.5*  HCT 35.8* 38.3* 41.1  MCV 77.2* 76.8* 76.4*  PLT 275 280 086   Basic Metabolic Panel: Recent Labs  Lab 06/27/20 0838 06/28/20 0646 06/29/20 0749 07/02/20 0429 07/03/20 0743  NA 135 136 135 136 135  K 3.2* 3.9 3.3* 3.1* 3.3*  CL 105 105 102 103 99  CO2 24 25 25 25 29   GLUCOSE 101* 99 112* 101* 109*  BUN 20 14 11  6* 5*  CREATININE 0.86 0.82 0.86 0.87 0.97  CALCIUM 7.4* 7.6* 7.8* 7.6* 7.8*  MG 2.0 2.0  --   --   --   PHOS 2.1* 2.3*  --   --   --    GFR: Estimated Creatinine Clearance: 64.8 mL/min (by C-G formula based on SCr of 0.97 mg/dL). Liver Function Tests: No results for input(s): AST, ALT, ALKPHOS, BILITOT, PROT, ALBUMIN in the last 168 hours.  No results for input(s): LIPASE, AMYLASE in the last 168 hours. No results for input(s): AMMONIA in the last 168 hours. Coagulation Profile: No results for input(s): INR, PROTIME in the last 168 hours. Cardiac Enzymes: No results for input(s): CKTOTAL, CKMB, CKMBINDEX, TROPONINI in the last 168 hours. BNP (last 3 results) No results for input(s): PROBNP in the last 8760 hours. HbA1C: No results for input(s): HGBA1C in the last 72 hours. CBG: No results for input(s): GLUCAP in the last 168 hours. Lipid Profile: No results for input(s): CHOL, HDL, LDLCALC, TRIG, CHOLHDL, LDLDIRECT in the last 72 hours. Thyroid Function Tests: No results for input(s): TSH, T4TOTAL, FREET4, T3FREE, THYROIDAB in the last 72 hours. Anemia Panel: No results for input(s): VITAMINB12, FOLATE, FERRITIN, TIBC, IRON, RETICCTPCT in the last 72 hours. Sepsis Labs: No results for input(s): PROCALCITON, LATICACIDVEN in the last 168 hours.  Recent Results (from the past 240 hour(s))  SARS Coronavirus 2 by RT PCR (hospital order, performed in [facility] hospital lab) Nasopharyngeal Nasopharyngeal Swab     Status: None   Collection Time: 06/24/20  1:48 PM   Specimen: Nasopharyngeal Swab  Result Value Ref Range  Status   SARS Coronavirus 2 NEGATIVE NEGATIVE Final    Comment: (NOTE) SARS-CoV-2 target nucleic acids are NOT DETECTED.  The SARS-CoV-2 RNA is generally detectable in upper and lower respiratory specimens during the acute phase of infection. The lowest concentration of SARS-CoV-2 viral copies this assay can detect is 250 copies / mL. A negative result does not preclude SARS-CoV-2 infection and should not be used as the sole basis for treatment or other patient management decisions.  A negative result may occur with improper specimen collection / handling, submission of specimen other than nasopharyngeal swab, presence of viral mutation(s) within the areas targeted by this assay, and inadequate number of viral copies (<250 copies / mL). A negative result must be combined with clinical observations, patient history, and epidemiological information.  Fact Sheet for Patients:   StrictlyIdeas.no  Fact Sheet for Healthcare Providers: BankingDealers.co.za  This test is not yet approved or  cleared by the [place] FDA and has been authorized for detection and/or diagnosis of SARS-CoV-2 by FDA under an Emergency Use Authorization (EUA).  This EUA will remain in effect (meaning this test can be used) for the duration of the COVID-19 declaration under Section 564(b)(1) of the Act, 21 U.S.C. section 360bbb-3(b)(1), unless the authorization is terminated or revoked sooner.  Performed at [facility], [address], [place], [place] [address]   Gastrointestinal Panel by PCR , Stool     Status: Abnormal   Collection Time: 06/24/20  4:39 PM   Specimen: Stool  Result Value Ref Range Status   Campylobacter species NOT DETECTED NOT DETECTED Final   Plesimonas shigelloides NOT DETECTED NOT DETECTED Final   Salmonella species NOT DETECTED NOT DETECTED Final   Yersinia enterocolitica NOT DETECTED NOT DETECTED Final   Vibrio species NOT DETECTED  NOT DETECTED Final   Vibrio cholerae NOT DETECTED NOT DETECTED Final   Enteroaggregative E coli (EAEC) NOT DETECTED NOT DETECTED Final   Enteropathogenic E coli (EPEC) NOT DETECTED NOT DETECTED Final   Enterotoxigenic E coli (ETEC) NOT DETECTED NOT DETECTED Final   Shiga like toxin producing E coli (STEC) NOT DETECTED NOT DETECTED Final   Shigella/Enteroinvasive E coli (EIEC) NOT DETECTED NOT DETECTED Final   Cryptosporidium NOT DETECTED NOT DETECTED Final   Cyclospora cayetanensis NOT DETECTED NOT DETECTED Final   Entamoeba histolytica NOT DETECTED  NOT DETECTED Final   Giardia lamblia NOT DETECTED NOT DETECTED Final   Adenovirus F40/41 NOT DETECTED NOT DETECTED Final   Astrovirus NOT DETECTED NOT DETECTED Final   Norovirus GI/GII DETECTED (A) NOT DETECTED Final    Comment: RESULT CALLED TO, READ BACK BY AND VERIFIED WITH: TIFFANY HARRISON RN 2221 06/25/20 HNM    Rotavirus A NOT DETECTED NOT DETECTED Final   Sapovirus (I, II, IV, and V) NOT DETECTED NOT DETECTED Final    Comment: Performed at [facility], [address] [place], [place] [address]  C Difficile Quick Screen w PCR reflex     Status: Abnormal   Collection Time: 06/24/20  4:39 PM   Specimen: Stool  Result Value Ref Range Status   C Diff antigen POSITIVE (A) NEGATIVE Final   C Diff toxin POSITIVE (A) NEGATIVE Final    Comment: CRITICAL RESULT CALLED TO, READ BACK BY AND VERIFIED WITH: M DOSS,RN@[id number]  06/24/20 MKELLY    C Diff interpretation Toxin producing C. difficile detected.  Final    Comment: Performed at [facility], [address], [place], [place] [address]         Radiology Studies: No results found.      Scheduled Meds: . aspirin  81 mg Oral QODAY  . cholecalciferol  1,000 Units Oral BH-[id number]  . feeding supplement  1 Container Oral TID BM  . furosemide  20 mg Intravenous Q12H  . heparin  5,000 Units Subcutaneous Q8H  . loperamide  4 mg Oral q AM  . multivitamin with minerals  1 tablet  Oral Daily  . vancomycin  125 mg Oral QID   Continuous Infusions:   LOS: 9 days    Time spent: 35 mins.More than 50% of that time was spent in counseling and/or coordination of care.      Shelly Coss, MD [facility] P9/23/2021, 2:08 PM

## 2020-07-03 NOTE — Progress Notes (Signed)
*  PRELIMINARY RESULTS* Echocardiogram 2D Echocardiogram has been performed.  Leavy Cella 07/03/2020, 4:32 PM

## 2020-07-04 LAB — BASIC METABOLIC PANEL
Anion gap: 7 (ref 5–15)
BUN: 6 mg/dL — ABNORMAL LOW (ref 8–23)
CO2: 30 mmol/L (ref 22–32)
Calcium: 7.9 mg/dL — ABNORMAL LOW (ref 8.9–10.3)
Chloride: 98 mmol/L (ref 98–111)
Creatinine, Ser: 0.95 mg/dL (ref 0.61–1.24)
GFR calc Af Amer: >60 mL/min (ref >60)
GFR calc non Af Amer: >60 mL/min (ref >60)
Glucose, Bld: 109 mg/dL — ABNORMAL HIGH (ref 70–99)
Potassium: 3.8 mmol/L (ref 3.5–5.1)
Sodium: 135 mmol/L (ref 135–145)

## 2020-07-04 MED ORDER — FAMOTIDINE 20 MG PO TABS: 20.0000 mg | ORAL_TABLET | Freq: Every day | ORAL | 0 refills | Status: DC

## 2020-07-04 MED ORDER — FUROSEMIDE 20 MG PO TABS: 20.0000 mg | ORAL_TABLET | Freq: Every day | ORAL | 0 refills | Status: DC

## 2020-07-04 NOTE — Progress Notes (Signed)
Physical Therapy Treatment Patient Details Name: Troy Sanchez MRN: [id number] DOB: 10-Jul-1941 Today's Date: 07/04/2020    History of Present Illness Troy Sanchez is a 79 y.o. male with medical history significant for pulmonary hypertension, hypertension, CAD, hyperlipidemia and GERD who presents to the emergency department due to 3-week onset of diarrhea and generalized weakness.  Patient states that he has been to urgent care twice prior to returning today, he states that he was prescribed an antibiotic due to the diarrhea when he first went around August 28th (he was unable to remember the name of antibiotic) and there was no improvement in diarrhea despite the antibiotic.  Diarrhea was watery in nature and was 4-6 episodes daily, he endorsed loss of sense of taste shortly after onset of diarrhea and he also complained of fever.  Patient states that he has been taking over-the-counter antidiarrheal medication (he did not remember the name of medication) without improvement.  He returned to urgent care today due to persistence of symptoms and increased weakness and he was asked to go to the ED for further evaluation and management.  Patient denies abdominal pain, nausea, vomiting, chest pain, shortness of breath, he also denies any sick contact.    PT Comments    Patient demonstrates good return for bed mobility, transfers and ambulation in hallway/stairs without loss of balance.  Plan:  Patient discharged from physical therapy to care of nursing for ambulation daily as tolerated for length of stay.   Follow Up Recommendations        Equipment Recommendations       Recommendations for Other Services       Precautions / Restrictions Precautions Precautions: None Restrictions Weight Bearing Restrictions: No    Mobility  Bed Mobility Overal bed mobility: Modified Independent                Transfers Overall transfer level: Modified independent                   Ambulation/Gait Ambulation/Gait assistance: Modified independent (Device/Increase time) Gait Distance (Feet): 300 Feet Assistive device: None Gait Pattern/deviations: WFL(Within Functional Limits) Gait velocity: slightly decreased   General Gait Details: demonstrates good return for ambulation on level, inclined and declined surfaces without loss of balance   Stairs Stairs: Yes Stairs assistance: Modified independent (Device/Increase time) Stair Management: One rail Left;One rail Right;Alternating pattern;Step to pattern Number of Stairs: 10 General stair comments: demonstrates good return for alternating pattern going up stairs, step to pattern coming down using 1 siderail without loss of balance   Wheelchair Mobility    Modified Rankin (Stroke Patients Only)       Balance Overall balance assessment: No apparent balance deficits (not formally assessed)                                          Cognition Arousal/Alertness: Awake/alert Behavior During Therapy: WFL for tasks assessed/performed Overall Cognitive Status: Within Functional Limits for tasks assessed                                        Exercises      General Comments        Pertinent Vitals/Pain Pain Assessment: No/denies pain    Home Living  Prior Function            PT Goals (current goals can now be found in the care plan section) Acute Rehab PT Goals Patient Stated Goal: return home with family to assist PT Goal Formulation: With patient Time For Goal Achievement: 07/04/20 Potential to Achieve Goals: Good Progress towards PT goals: Goals met and updated - see care plan    Frequency           PT Plan Other (comment) (Patient discharged to nursing for ambulation daily as tolerated)    Co-evaluation              AM-PAC PT "6 Clicks" Mobility   Outcome Measure  Help needed turning from your back to your side  while in a flat bed without using bedrails?: None Help needed moving from lying on your back to sitting on the side of a flat bed without using bedrails?: None Help needed moving to and from a bed to a chair (including a wheelchair)?: None Help needed standing up from a chair using your arms (e.g., wheelchair or bedside chair)?: None Help needed to walk in hospital room?: None Help needed climbing 3-5 steps with a railing? : None 6 Click Score: 24    End of Session   Activity Tolerance: Patient tolerated treatment well Patient left: in chair;with call bell/phone within reach Nurse Communication: Mobility status PT Visit Diagnosis: Unsteadiness on feet (R26.81);Other abnormalities of gait and mobility (R26.89);Muscle weakness (generalized) (M62.81)     Time: 2458-0998 PT Time Calculation (min) (ACUTE ONLY): 15 min  Charges:  $Gait Training: 8-22 mins                     12:33 PM, 07/04/20 Lonell Grandchild, MPT Physical Therapist with [facility] [phone] office [phone] mobile phone

## 2020-07-04 NOTE — Discharge Summary (Signed)
Physician Discharge Summary  Troy Sanchez GYF:[id number] DOB: [birth year] DOA: 06/24/2020  PCP: Deland Pretty, MD  Admit date: 06/24/2020 Discharge date: 07/04/2020  Admitted From: Home Disposition:  Home  Discharge Condition:Stable CODE STATUS:FULL Diet recommendation: Heart Healthy   Brief/Interim Summary:  Patient is a 79 year old male with history of pulmonary hypertension, hypertension, coronary disease, hyperlipidemia, GERD who presents to the emergency department with complaints of 3-week history of diarrhea, progressive generalized weakness.  On 8/28, he went to an urgent care and was prescribed antibiotic which did not help.  He tried over-the-counter antidiarrheals which did not help.  Also reported of having fever at home.  On presentation he was febrile, tachycardic.  He had elevated leukocytes, AKI.  CT abdomen/pelvis was consistent with diffuse colitis.  He was admitted and was managed for C. difficile colitis.  Diarrhea has finally slowed down and he stopped.  Hospital course remarkable for bilateral lower extremity edema.  He is hemodynamically stable for discharge home today.  Following problems were addressed during hospitalization:  Sepsis: Presented with fever, leukocytosis, AKI.  Secondary to C. difficile colitis.  Sepsis physiology has resolved.  Currently he is hemodynamically stable.  Blood cultures have been negative.  C. difficile colitis/norovirus: CT abdomen/pelvis showed diffuse colitis on presentation.  Denies any abdomen pain, nausea or vomiting today.  Abdomen is nondistended.  Diarrhea has stopped.  Continue vancomycin to complete the  course. GI pathogen also showed norovirus.  AKI: Resolved with IV fluids.  Leukocytosis: Resolved  Edema: Has bilateral lower extremity, penile, scrotal edema likely from aggressive fluid resuscitation.  IV fluids have been discontinued.  Started on IV Lasix.  Echocardiogram showed normal left ventricular function, normal  ejection fraction. Will change IV to oral Lasix which he can continue for 2 weeks  Hypokalemia/hypomagnesemia/hypophosphatemia: Supplemented ans corrected.  Severe iron deficiency microcytic anemia: Low iron level.  Given IV iron.  History of hypertension/pulmonary hypertension: Follows with pulmonology.  Not on oxygen.  On Imdur.  His blood pressures remain soft so amlodipine discontinued.  Coronary artery disease: On aspirin and statin.  Debility/deconditioning: PT evaluated the patient and recommended home health on discharge.  Discharge Diagnoses:  Principal Problem:   Acute colitis Active Problems:   CAD (coronary artery disease)   HTN (hypertension)   Sepsis (HCC)   Diarrhea   Generalized weakness   Microcytic anemia   Leukocytosis   Serum total bilirubin elevated   Dehydration   Malnutrition of moderate degree    Discharge Instructions  Discharge Instructions    Activity as tolerated - No restrictions   Complete by: As directed    Call MD for:  persistant nausea and vomiting   Complete by: As directed    Call MD for:  temperature >100.4   Complete by: As directed    Diet - low sodium heart healthy   Complete by: As directed    Discharge instructions   Complete by: As directed    Discharge to home Follow up with PCP in 7-10 days. Have Chemistry drawn on that visit.   Discharge instructions   Complete by: As directed    1)Please follow-up with your PCP in a week.  Do a BMP test during the follow-up. 2)Take prescribed medications as instructed. 3)Elevate your legs while lying on the bed or sitting on the chair.   Increase activity slowly   Complete by: As directed    Increase activity slowly   Complete by: As directed      Allergies as of  07/04/2020   No Known Allergies     Medication List    STOP taking these medications   amLODipine 5 MG tablet Commonly known as: NORVASC   diphenoxylate-atropine 2.5-0.025 MG tablet Commonly known as:  LOMOTIL   omeprazole 40 MG capsule Commonly known as: PRILOSEC     TAKE these medications   aspirin 325 MG tablet Take 325 mg by mouth daily.   atorvastatin 10 MG tablet Commonly known as: LIPITOR Take 10 mg by mouth daily.   cholecalciferol 1000 units tablet Commonly known as: VITAMIN D Take 1,000 Units by mouth every morning.   famotidine 20 MG tablet Commonly known as: PEPCID Take 1 tablet (20 mg total) by mouth daily.   furosemide 20 MG tablet Commonly known as: Lasix Take 1 tablet (20 mg total) by mouth daily for 14 days.   isosorbide mononitrate 60 MG 24 hr tablet Commonly known as: IMDUR Take 60 mg by mouth daily.   loperamide 2 MG capsule Commonly known as: IMODIUM Take 2 mg by mouth 4 (four) times daily as needed for diarrhea or loose stools.   multivitamin with minerals Tabs tablet Take 1 tablet by mouth daily. What changed: when to take this   vancomycin 125 MG capsule Commonly known as: VANCOCIN Take 1 capsule (125 mg total) by mouth 4 (four) times daily. Through 07/04/2020       Follow-up Information    [facility], [facility] Follow up.   Specialty: [place] Why: [facility] staff will call you to schedule home physical thearpy visits Contact information: [place] [address] [phone]        Deland Pretty, MD Follow up on 07/15/2020.   Specialty: Internal Medicine Why: Appointment time 9:30AM Contact information: [address] [place] [address] [phone]              No Known Allergies  Consultations: None  Procedures/Studies: CT ABDOMEN PELVIS W CONTRAST  Result Date: 06/24/2020 CLINICAL DATA:  Abdominal pain with leukocytosis and diarrhea EXAM: CT ABDOMEN AND PELVIS WITH CONTRAST TECHNIQUE: Multidetector CT imaging of the abdomen and pelvis was performed using the standard protocol following bolus administration of intravenous contrast. CONTRAST:  13mL  OMNIPAQUE IOHEXOL 300 MG/ML  SOLN COMPARISON:  October 03, 2014. FINDINGS: Lower chest: There is underlying fibrosis bilaterally. There is a small area of apparent loculated effusion in the right base region. There are multiple foci of coronary artery calcification. There is thickening of the distal esophageal wall. Hepatobiliary: There is a cyst in the anterior segment of the right lobe of the liver measuring 0.7 x 0.6 cm. No other focal liver lesions are evident. The gallbladder is absent. There is intrahepatic biliary duct dilatation. The common hepatic duct is enlarged, measuring 13 mm. The common bile duct tapers distally. No obstructing lesion is seen in the biliary ductal system by CT. Pancreas: There is no pancreatic mass or inflammatory focus. Spleen: No splenic lesions are appreciable. Adrenals/Urinary Tract: Adrenals bilaterally appear normal. There is a cyst arising from the posterior upper pole of the left kidney measuring 1.2 x 1.2 cm. There is no appreciable hydronephrosis on either side. There is a 3 x 2 mm calculus in the lower pole of the left kidney. There is a 1 mm calculus in the mid right kidney. There is a 2 mm calculus in the upper pole of the right kidney. There is a 2 x 2 mm calculus in the upper pole  of the left kidney. There is no appreciable ureteral calculus on either side. Urinary bladder is midline with wall thickness within normal limits. Stomach/Bowel: There is diffuse wall thickening throughout the entire:. There is patchy soft tissue stranding surrounding multiple loops of colon. No appreciable diverticular disease is evident. There is no colonic pneumatosis. There is no appreciable small bowel wall thickening or small bowel dilatation. No focal bowel obstruction is demonstrable on this study. The terminal ileum appears unremarkable. There is no appreciable free air or portal venous air. Slight fluid is seen surrounding several loops of bowel with fluid noted in the right and  left lateral conal fascia regions. There is no free air or portal venous air. There is an uncomplicated diverticulum arising in the third portion of the duodenum measuring 1.3 x 1.0 cm. Note that there is a degree of colonic interposition between the liver and right hemidiaphragm. Bowel in this area of colonic interposition is diffusely inflamed. Vascular/Lymphatic: There is no abdominal aortic aneurysm. There are foci of aortic atherosclerosis in the aorta and iliac arteries. No appreciable mesenteric arterial vascular narrowing evident. Major venous structures are patent. There is no appreciable adenopathy in the abdomen or pelvis. Reproductive: Prostate and seminal vesicles are normal in size and contour. There are occasional prostatic calculi. Other: Appendix appears normal. No evident abscess in the abdomen pelvis. Minimal ascites noted. There is evidence of midline abdominal wall soft tissue thickening, likely of postoperative etiology. There is rectus muscle thinning in this area. No bowel compromise seen in this area. There is evidence of fluid tracking into a small inguinal hernia on the right. No bowel extending into this area. Musculoskeletal: No blastic or lytic bone lesions. No intramuscular lesions are evident. IMPRESSION: 1. Diffuse colonic wall thickening throughout essentially the entire colon consistent with diffuse colitis. There is surrounding mesenteric thickening in areas of mild fluid tracking along the colon. No evident perforation. No appreciable diverticular disease. No pneumatosis. Etiology for the colitis uncertain. Infectious colitis could certainly present in this manner. No appreciable major mesenteric arterial vessel narrowing to suggest ischemic etiology. 2. Essentially normal appearing small bowel. Terminal ileum appears normal. No bowel obstruction. 3.  Appendix appears normal.  No abscess in the abdomen or pelvis. 4. Nonobstructing small calculi in each kidney. No hydronephrosis  or ureteral calculus on either side. Urinary bladder wall thickness normal. 5. Gallbladder absent. Intrahepatic and common hepatic bile duct dilatation noted. No obstructing focus in the biliary ductal system evident by CT. 6. Aortic Atherosclerosis (ICD10-I70.0). Foci of pelvic arterial vascular calcification as well as foci of coronary artery calcification noted. 7. Thickening of the distal esophageal wall. Question a degree of distal esophagitis. 8. Small amount ascites. A small amount of fluid tracks into a right inguinal hernia. Electronically Signed   By: Lowella Grip III M.D.   On: 06/24/2020 19:08   ECHOCARDIOGRAM COMPLETE  Result Date: 07/03/2020    ECHOCARDIOGRAM REPORT   Patient Name:   BARRIE WALE Date of Exam: 07/03/2020 Medical Rec #:  [id number]       Height:       70.0 in Accession #:    [id number]      Weight:       191.9 lb Date of Birth:  March 30, 1941       BSA:          2.051 m Patient Age:    46 years        BP:  112/70 mmHg Patient Gender: M               HR:           72 bpm. Exam Location:  [facility] Procedure: 2D Echo Indications:    CHF-Acute Systolic 193.79 / K24.09  History:        Patient has prior history of Echocardiogram examinations, most                 recent 10/31/2017. CAD, Signs/Symptoms:Dyspnea; Risk                 Factors:Hypertension and Non-Smoker.  Sonographer:    Leavy Cella RDCS (AE) Referring Phys: [id number] AVA SWAYZE IMPRESSIONS  1. Left ventricular ejection fraction, by estimation, is 65 to 70%. The left ventricle has normal function. The left ventricle has no regional wall motion abnormalities. Left ventricular diastolic parameters are indeterminate.  2. Right ventricular systolic function was not well visualized. The right ventricular size is not well visualized.  3. Left atrial size was moderately dilated.  4. The mitral valve is normal in structure. No evidence of mitral valve regurgitation. No evidence of mitral stenosis.  5. The aortic valve is  tricuspid. Aortic valve regurgitation is mild. No aortic stenosis is present.  6. The inferior vena cava is normal in size with greater than 50% respiratory variability, suggesting right atrial pressure of 3 mmHg. FINDINGS  Left Ventricle: Left ventricular ejection fraction, by estimation, is 65 to 70%. The left ventricle has normal function. The left ventricle has no regional wall motion abnormalities. The left ventricular internal cavity size was normal in size. There is  no left ventricular hypertrophy. Left ventricular diastolic parameters are indeterminate. Right Ventricle: The right ventricular size is not well visualized. Right vetricular wall thickness was not assessed. Right ventricular systolic function was not well visualized. Left Atrium: Left atrial size was moderately dilated. Right Atrium: Right atrial size was not well visualized. Pericardium: There is no evidence of pericardial effusion. Mitral Valve: The mitral valve is normal in structure. No evidence of mitral valve regurgitation. No evidence of mitral valve stenosis. Tricuspid Valve: The tricuspid valve is not well visualized. Tricuspid valve regurgitation is trivial. No evidence of tricuspid stenosis. Aortic Valve: The aortic valve is tricuspid. Aortic valve regurgitation is mild. No aortic stenosis is present. Aortic valve mean gradient measures 4.5 mmHg. Aortic valve peak gradient measures 9.2 mmHg. Aortic valve area, by VTI measures 3.06 cm. Pulmonic Valve: The pulmonic valve was not well visualized. Pulmonic valve regurgitation is not visualized. No evidence of pulmonic stenosis. Aorta: The aortic root is normal in size and structure. Pulmonary Artery: Indeterminant PASP, inadequate TR jet. Venous: The inferior vena cava is normal in size with greater than 50% respiratory variability, suggesting right atrial pressure of 3 mmHg. IAS/Shunts: The interatrial septum was not well visualized.  LEFT VENTRICLE PLAX 2D LVIDd:         4.31 cm   Diastology LVIDs:         2.79 cm  LV e' medial:    8.05 cm/s LV PW:         1.10 cm  LV E/e' medial:  6.9 LV IVS:        1.15 cm  LV e' lateral:   8.70 cm/s LVOT diam:     2.10 cm  LV E/e' lateral: 6.4 LV SV:         83 LV SV Index:   40 LVOT Area:  3.46 cm  RIGHT VENTRICLE RV S prime:     15.30 cm/s TAPSE (M-mode): 2.7 cm LEFT ATRIUM           Index       RIGHT ATRIUM           Index LA diam:      3.90 cm 1.90 cm/m  RA Area:     13.30 cm LA Vol (A2C): 34.7 ml 16.92 ml/m RA Volume:   32.10 ml  15.65 ml/m LA Vol (A4C): 74.0 ml 36.08 ml/m  AORTIC VALVE AV Area (Vmax):    2.87 cm AV Area (Vmean):   2.84 cm AV Area (VTI):     3.06 cm AV Vmax:           152.03 cm/s AV Vmean:          97.241 cm/s AV VTI:            0.270 m AV Peak Grad:      9.2 mmHg AV Mean Grad:      4.5 mmHg LVOT Vmax:         125.97 cm/s LVOT Vmean:        79.871 cm/s LVOT VTI:          0.239 m LVOT/AV VTI ratio: 0.88  AORTA Ao Root diam: 3.30 cm MITRAL VALVE               TRICUSPID VALVE MV Area (PHT): 3.12 cm    TR Peak grad:   7.1 mmHg MV Decel Time: 243 msec    TR Vmax:        133.00 cm/s MV E velocity: 55.30 cm/s MV A velocity: 46.70 cm/s  SHUNTS MV E/A ratio:  1.18        Systemic VTI:  0.24 m                            Systemic Diam: 2.10 cm Carlyle Dolly MD Electronically signed by Carlyle Dolly MD Signature Date/Time: 07/03/2020/4:37:17 PM    Final        Subjective: Patient seen and examined at the bedside this morning.  Hemodynamically stable for discharge today.  Discharge Exam: Vitals:   07/03/20 2207 07/04/20 0548  BP: 108/66 121/74  Pulse: 74 80  Resp: 16 15  Temp: 99.4 F (37.4 C) 98.6 F (37 C)  SpO2: 95% 91%   Vitals:   07/03/20 1554 07/03/20 2008 07/03/20 2207 07/04/20 0548  BP: 112/70  108/66 121/74  Pulse: 72  74 80  Resp: 16  16 15   Temp: 99.7 F (37.6 C)  99.4 F (37.4 C) 98.6 F (37 C)  TempSrc: Oral   Oral  SpO2: 100% 94% 95% 91%  Weight:      Height:        General: Pt is  alert, awake, not in acute distress Cardiovascular: RRR, S1/S2 +, no rubs, no gallops Respiratory: CTA bilaterally, no wheezing, no rhonchi Abdominal: Soft, NT, ND, bowel sounds + Extremities: no edema, no cyanosis    The results of significant diagnostics from this hospitalization (including imaging, microbiology, ancillary and laboratory) are listed below for reference.     Microbiology: Recent Results (from the past 240 hour(s))  SARS Coronavirus 2 by RT PCR (hospital order, performed in [facility] hospital lab) Nasopharyngeal Nasopharyngeal Swab     Status: None   Collection Time: 06/24/20  1:48 PM   Specimen: Nasopharyngeal Swab  Result  Value Ref Range Status   SARS Coronavirus 2 NEGATIVE NEGATIVE Final    Comment: (NOTE) SARS-CoV-2 target nucleic acids are NOT DETECTED.  The SARS-CoV-2 RNA is generally detectable in upper and lower respiratory specimens during the acute phase of infection. The lowest concentration of SARS-CoV-2 viral copies this assay can detect is 250 copies / mL. A negative result does not preclude SARS-CoV-2 infection and should not be used as the sole basis for treatment or other patient management decisions.  A negative result may occur with improper specimen collection / handling, submission of specimen other than nasopharyngeal swab, presence of viral mutation(s) within the areas targeted by this assay, and inadequate number of viral copies (<250 copies / mL). A negative result must be combined with clinical observations, patient history, and epidemiological information.  Fact Sheet for Patients:   StrictlyIdeas.no  Fact Sheet for Healthcare Providers: BankingDealers.co.za  This test is not yet approved or  cleared by the [place] FDA and has been authorized for detection and/or diagnosis of SARS-CoV-2 by FDA under an Emergency Use Authorization (EUA).  This EUA will remain in effect (meaning  this test can be used) for the duration of the COVID-19 declaration under Section 564(b)(1) of the Act, 21 U.S.C. section 360bbb-3(b)(1), unless the authorization is terminated or revoked sooner.  Performed at [facility], [address] [place], [place] [address]   Gastrointestinal Panel by PCR , Stool     Status: Abnormal   Collection Time: 06/24/20  4:39 PM   Specimen: Stool  Result Value Ref Range Status   Campylobacter species NOT DETECTED NOT DETECTED Final   Plesimonas shigelloides NOT DETECTED NOT DETECTED Final   Salmonella species NOT DETECTED NOT DETECTED Final   Yersinia enterocolitica NOT DETECTED NOT DETECTED Final   Vibrio species NOT DETECTED NOT DETECTED Final   Vibrio cholerae NOT DETECTED NOT DETECTED Final   Enteroaggregative E coli (EAEC) NOT DETECTED NOT DETECTED Final   Enteropathogenic E coli (EPEC) NOT DETECTED NOT DETECTED Final   Enterotoxigenic E coli (ETEC) NOT DETECTED NOT DETECTED Final   Shiga like toxin producing E coli (STEC) NOT DETECTED NOT DETECTED Final   Shigella/Enteroinvasive E coli (EIEC) NOT DETECTED NOT DETECTED Final   Cryptosporidium NOT DETECTED NOT DETECTED Final   Cyclospora cayetanensis NOT DETECTED NOT DETECTED Final   Entamoeba histolytica NOT DETECTED NOT DETECTED Final   Giardia lamblia NOT DETECTED NOT DETECTED Final   Adenovirus F40/41 NOT DETECTED NOT DETECTED Final   Astrovirus NOT DETECTED NOT DETECTED Final   Norovirus GI/GII DETECTED (A) NOT DETECTED Final    Comment: RESULT CALLED TO, READ BACK BY AND VERIFIED WITH: TIFFANY HARRISON RN [id number] 06/25/20 HNM    Rotavirus A NOT DETECTED NOT DETECTED Final   Sapovirus (I, II, IV, and V) NOT DETECTED NOT DETECTED Final    Comment: Performed at [facility], [address] [place], [place] [address]  C Difficile Quick Screen w PCR reflex     Status: Abnormal   Collection Time: 06/24/20  4:39 PM   Specimen: Stool  Result Value Ref Range Status   C Diff antigen  POSITIVE (A) NEGATIVE Final   C Diff toxin POSITIVE (A) NEGATIVE Final    Comment: CRITICAL RESULT CALLED TO, READ BACK BY AND VERIFIED WITH: M DOSS,RN@[id number]  06/24/20 MKELLY    C Diff interpretation Toxin producing C. difficile detected.  Final    Comment: Performed at [facility], [address] [place], [place] [address]  Labs: BNP (last 3 results) No results for input(s): BNP in the last 8760 hours. Basic Metabolic Panel: Recent Labs  Lab 06/28/20 0646 06/29/20 0749 07/02/20 0429 07/03/20 0743 07/04/20 0716  NA 136 135 136 135 135  K 3.9 3.3* 3.1* 3.3* 3.8  CL 105 102 103 99 98  CO2 25 25 25 29 30   GLUCOSE 99 112* 101* 109* 109*  BUN 14 11 6* 5* 6*  CREATININE 0.82 0.86 0.87 0.97 0.95  CALCIUM 7.6* 7.8* 7.6* 7.8* 7.9*  MG 2.0  --   --   --   --   PHOS 2.3*  --   --   --   --    Liver Function Tests: No results for input(s): AST, ALT, ALKPHOS, BILITOT, PROT, ALBUMIN in the last 168 hours. No results for input(s): LIPASE, AMYLASE in the last 168 hours. No results for input(s): AMMONIA in the last 168 hours. CBC: Recent Labs  Lab 06/28/20 0646 06/29/20 0749  WBC 13.3* 11.4*  NEUTROABS 11.4* 9.1*  HGB 11.7* 12.5*  HCT 38.3* 41.1  MCV 76.8* 76.4*  PLT 280 323   Cardiac Enzymes: No results for input(s): CKTOTAL, CKMB, CKMBINDEX, TROPONINI in the last 168 hours. BNP: Invalid input(s): POCBNP CBG: No results for input(s): GLUCAP in the last 168 hours. D-Dimer No results for input(s): DDIMER in the last 72 hours. Hgb A1c No results for input(s): HGBA1C in the last 72 hours. Lipid Profile No results for input(s): CHOL, HDL, LDLCALC, TRIG, CHOLHDL, LDLDIRECT in the last 72 hours. Thyroid function studies No results for input(s): TSH, T4TOTAL, T3FREE, THYROIDAB in the last 72 hours.  Invalid input(s): FREET3 Anemia work up No results for input(s): VITAMINB12, FOLATE, FERRITIN, TIBC, IRON, RETICCTPCT in the last 72 hours. Urinalysis    Component Value  Date/Time   COLORURINE YELLOW 06/24/2020 1349   APPEARANCEUR HAZY (A) 06/24/2020 1349   LABSPEC 1.035 (H) 06/24/2020 1349   PHURINE 5.0 06/24/2020 1349   GLUCOSEU NEGATIVE 06/24/2020 1349   HGBUR NEGATIVE 06/24/2020 1349   BILIRUBINUR NEGATIVE 06/24/2020 1349   KETONESUR NEGATIVE 06/24/2020 1349   PROTEINUR 30 (A) 06/24/2020 1349   UROBILINOGEN 4.0 (H) 03/08/2014 1449   NITRITE NEGATIVE 06/24/2020 1349   LEUKOCYTESUR NEGATIVE 06/24/2020 1349   Sepsis Labs Invalid input(s): PROCALCITONIN,  WBC,  LACTICIDVEN Microbiology Recent Results (from the past 240 hour(s))  SARS Coronavirus 2 by RT PCR (hospital order, performed in [facility] hospital lab) Nasopharyngeal Nasopharyngeal Swab     Status: None   Collection Time: 06/24/20  1:48 PM   Specimen: Nasopharyngeal Swab  Result Value Ref Range Status   SARS Coronavirus 2 NEGATIVE NEGATIVE Final    Comment: (NOTE) SARS-CoV-2 target nucleic acids are NOT DETECTED.  The SARS-CoV-2 RNA is generally detectable in upper and lower respiratory specimens during the acute phase of infection. The lowest concentration of SARS-CoV-2 viral copies this assay can detect is 250 copies / mL. A negative result does not preclude SARS-CoV-2 infection and should not be used as the sole basis for treatment or other patient management decisions.  A negative result may occur with improper specimen collection / handling, submission of specimen other than nasopharyngeal swab, presence of viral mutation(s) within the areas targeted by this assay, and inadequate number of viral copies (<250 copies / mL). A negative result must be combined with clinical observations, patient history, and epidemiological information.  Fact Sheet for Patients:   StrictlyIdeas.no  Fact Sheet for Healthcare Providers: BankingDealers.co.za  This test is  not yet approved or  cleared by the [place] and has been authorized for  detection and/or diagnosis of SARS-CoV-2 by FDA under an Emergency Use Authorization (EUA).  This EUA will remain in effect (meaning this test can be used) for the duration of the COVID-19 declaration under Section 564(b)(1) of the Act, 21 U.S.C. section 360bbb-3(b)(1), unless the authorization is terminated or revoked sooner.  Performed at [facility], [address], [place], [place] [address]   Gastrointestinal Panel by PCR , Stool     Status: Abnormal   Collection Time: 06/24/20  4:39 PM   Specimen: Stool  Result Value Ref Range Status   Campylobacter species NOT DETECTED NOT DETECTED Final   Plesimonas shigelloides NOT DETECTED NOT DETECTED Final   Salmonella species NOT DETECTED NOT DETECTED Final   Yersinia enterocolitica NOT DETECTED NOT DETECTED Final   Vibrio species NOT DETECTED NOT DETECTED Final   Vibrio cholerae NOT DETECTED NOT DETECTED Final   Enteroaggregative E coli (EAEC) NOT DETECTED NOT DETECTED Final   Enteropathogenic E coli (EPEC) NOT DETECTED NOT DETECTED Final   Enterotoxigenic E coli (ETEC) NOT DETECTED NOT DETECTED Final   Shiga like toxin producing E coli (STEC) NOT DETECTED NOT DETECTED Final   Shigella/Enteroinvasive E coli (EIEC) NOT DETECTED NOT DETECTED Final   Cryptosporidium NOT DETECTED NOT DETECTED Final   Cyclospora cayetanensis NOT DETECTED NOT DETECTED Final   Entamoeba histolytica NOT DETECTED NOT DETECTED Final   Giardia lamblia NOT DETECTED NOT DETECTED Final   Adenovirus F40/41 NOT DETECTED NOT DETECTED Final   Astrovirus NOT DETECTED NOT DETECTED Final   Norovirus GI/GII DETECTED (A) NOT DETECTED Final    Comment: RESULT CALLED TO, READ BACK BY AND VERIFIED WITH: TIFFANY HARRISON RN 2221 06/25/20 HNM    Rotavirus A NOT DETECTED NOT DETECTED Final   Sapovirus (I, II, IV, and V) NOT DETECTED NOT DETECTED Final    Comment: Performed at [facility], [address] [place], [place] [address]  C Difficile Quick Screen w PCR  reflex     Status: Abnormal   Collection Time: 06/24/20  4:39 PM   Specimen: Stool  Result Value Ref Range Status   C Diff antigen POSITIVE (A) NEGATIVE Final   C Diff toxin POSITIVE (A) NEGATIVE Final    Comment: CRITICAL RESULT CALLED TO, READ BACK BY AND VERIFIED WITH: M DOSS,RN@2254  06/24/20 MKELLY    C Diff interpretation Toxin producing C. difficile detected.  Final    Comment: Performed at [facility], [address], [place], [place] [address]    Please note: You were cared for by a hospitalist during your hospital stay. Once you are discharged, your primary care physician will handle any further medical issues. Please note that NO REFILLS for any discharge medications will be authorized once you are discharged, as it is imperative that you return to your primary care physician (or establish a relationship with a primary care physician if you do not have one) for your post hospital discharge needs so that they can reassess your need for medications and monitor your lab values.    Time coordinating discharge: 40 minutes  SIGNED:   Shelly Coss, MD  [facility] 07/04/2020, 11:33 AM Pager [phone]  If 7PM-7AM, please contact night-coverage [email or web address] Password [email or web address]

## 2020-07-04 NOTE — TOC Transition Note (Signed)
Transition of Care [facility]) - CM/SW Discharge Note   Patient Details  Name: Troy Sanchez MRN: [id number] Date of Birth: 12-11-1940  [facility] CM/SW Contact:  Shade Flood, LCSW Phone Number: 07/04/2020, 10:34 AM   Clinical Narrative:     Pt stable for dc today per MD. HHPT was ordered by MD. Damaris Schooner with pt who continues to state that he does not want a referral for HHPT. Updated MD. No other TOC needs identified for dc.  Final next level of care: Home/Self Care Barriers to Discharge: Barriers Resolved   Patient Goals and CMS Choice        Discharge Placement                       Discharge Plan and Services                          HH Arranged: PT [facility] Agency: [place] Date [facility] Agency Contacted: 06/27/20   Representative spoke with at [place]: [place] ([place]) Interventions     Readmission Risk Interventions Readmission Risk Prevention Plan 07/04/2020  Medication Screening Complete  Transportation Screening Complete  Some recent data might be hidden

## 2020-07-11 ENCOUNTER — Ambulatory Visit: Payer: Medicare Other | Admitting: Internal Medicine

## 2020-07-15 DIAGNOSIS — Z8619 Personal history of other infectious and parasitic diseases: Secondary | ICD-10-CM | POA: Diagnosis not present

## 2020-07-15 DIAGNOSIS — R600 Localized edema: Secondary | ICD-10-CM | POA: Diagnosis not present

## 2020-07-15 DIAGNOSIS — D508 Other iron deficiency anemias: Secondary | ICD-10-CM | POA: Diagnosis not present

## 2020-07-15 DIAGNOSIS — E785 Hyperlipidemia, unspecified: Secondary | ICD-10-CM | POA: Diagnosis not present

## 2020-07-15 DIAGNOSIS — Z09 Encounter for follow-up examination after completed treatment for conditions other than malignant neoplasm: Secondary | ICD-10-CM | POA: Diagnosis not present

## 2020-07-21 DIAGNOSIS — D508 Other iron deficiency anemias: Secondary | ICD-10-CM | POA: Diagnosis not present

## 2020-07-21 DIAGNOSIS — Z8619 Personal history of other infectious and parasitic diseases: Secondary | ICD-10-CM | POA: Diagnosis not present

## 2020-07-21 DIAGNOSIS — R600 Localized edema: Secondary | ICD-10-CM | POA: Diagnosis not present

## 2020-07-28 ENCOUNTER — Telehealth: Payer: Self-pay | Admitting: Gastroenterology

## 2020-07-28 NOTE — Telephone Encounter (Signed)
Yes he will need a follow up as he cancelled the follow up for 02-2020. For GERD and med refills

## 2020-07-30 NOTE — Telephone Encounter (Signed)
Dr Loletha Carrow, can we refill the Rx requested per patient.  He did cancel his last follow up for med refills

## 2020-07-30 NOTE — Telephone Encounter (Signed)
Pt is requesting a call back from a nurse to discuss any refills that could be made prior to his scheduled appt 08/18/2020

## 2020-07-30 NOTE — Telephone Encounter (Signed)
Yes, that would be fine. Thanks  - HD

## 2020-07-31 ENCOUNTER — Other Ambulatory Visit: Payer: Self-pay

## 2020-07-31 MED ORDER — FAMOTIDINE 20 MG PO TABS: 20.0000 mg | ORAL_TABLET | Freq: Every day | ORAL | 0 refills | Status: DC

## 2020-07-31 NOTE — Telephone Encounter (Signed)
Refill given

## 2020-08-01 ENCOUNTER — Other Ambulatory Visit: Payer: Self-pay

## 2020-08-01 ENCOUNTER — Ambulatory Visit (HOSPITAL_COMMUNITY)
Admission: EM | Admit: 2020-08-01 | Discharge: 2020-08-01 | Disposition: A | Discharge status: Home / Self Care | Payer: Medicare Other | Attending: Family Medicine | Admitting: Family Medicine

## 2020-08-01 ENCOUNTER — Encounter (HOSPITAL_COMMUNITY): Payer: Self-pay | Admitting: Emergency Medicine

## 2020-08-01 DIAGNOSIS — I2510 Atherosclerotic heart disease of native coronary artery without angina pectoris: Secondary | ICD-10-CM | POA: Diagnosis not present

## 2020-08-01 DIAGNOSIS — I10 Essential (primary) hypertension: Secondary | ICD-10-CM | POA: Insufficient documentation

## 2020-08-01 DIAGNOSIS — Z8619 Personal history of other infectious and parasitic diseases: Secondary | ICD-10-CM | POA: Insufficient documentation

## 2020-08-01 DIAGNOSIS — Z9049 Acquired absence of other specified parts of digestive tract: Secondary | ICD-10-CM | POA: Diagnosis not present

## 2020-08-01 DIAGNOSIS — R634 Abnormal weight loss: Secondary | ICD-10-CM | POA: Diagnosis not present

## 2020-08-01 DIAGNOSIS — Z79899 Other long term (current) drug therapy: Secondary | ICD-10-CM | POA: Insufficient documentation

## 2020-08-01 DIAGNOSIS — R197 Diarrhea, unspecified: Secondary | ICD-10-CM | POA: Diagnosis not present

## 2020-08-01 LAB — COMPREHENSIVE METABOLIC PANEL
ALT: 13 U/L (ref 0–44)
AST: 20 U/L (ref 15–41)
Albumin: 2.7 g/dL — ABNORMAL LOW (ref 3.5–5.0)
Alkaline Phosphatase: 65 U/L (ref 38–126)
Anion gap: 8 (ref 5–15)
BUN: 11 mg/dL (ref 8–23)
CO2: 27 mmol/L (ref 22–32)
Calcium: 8.7 mg/dL — ABNORMAL LOW (ref 8.9–10.3)
Chloride: 100 mmol/L (ref 98–111)
Creatinine, Ser: 1.04 mg/dL (ref 0.61–1.24)
GFR, Estimated: >60 mL/min (ref >60)
Glucose, Bld: 102 mg/dL — ABNORMAL HIGH (ref 70–99)
Potassium: 3.5 mmol/L (ref 3.5–5.1)
Sodium: 135 mmol/L (ref 135–145)
Total Bilirubin: 1.4 mg/dL — ABNORMAL HIGH (ref 0.3–1.2)
Total Protein: 6.1 g/dL — ABNORMAL LOW (ref 6.5–8.1)

## 2020-08-01 LAB — CBC WITH DIFFERENTIAL/PLATELET
Abs Immature Granulocytes: 0.08 10*3/uL — ABNORMAL HIGH (ref 0.00–0.07)
Basophils Absolute: 0.1 10*3/uL (ref 0.0–0.1)
Basophils Relative: 1 %
Eosinophils Absolute: 0.2 10*3/uL (ref 0.0–0.5)
Eosinophils Relative: 2 %
HCT: 33 % — ABNORMAL LOW (ref 39.0–52.0)
Hemoglobin: 10.2 g/dL — ABNORMAL LOW (ref 13.0–17.0)
Immature Granulocytes: 1 %
Lymphocytes Relative: 10 %
Lymphs Abs: 1.5 10*3/uL (ref 0.7–4.0)
MCH: 23.7 pg — ABNORMAL LOW (ref 26.0–34.0)
MCHC: 30.9 g/dL (ref 30.0–36.0)
MCV: 76.6 fL — ABNORMAL LOW (ref 80.0–100.0)
Monocytes Absolute: 0.7 10*3/uL (ref 0.1–1.0)
Monocytes Relative: 5 %
Neutro Abs: 12.6 10*3/uL — ABNORMAL HIGH (ref 1.7–7.7)
Neutrophils Relative %: 81 %
Platelets: 282 10*3/uL (ref 150–400)
RBC: 4.31 MIL/uL (ref 4.22–5.81)
RDW: 20.1 % — ABNORMAL HIGH (ref 11.5–15.5)
WBC: 15.2 10*3/uL — ABNORMAL HIGH (ref 4.0–10.5)
nRBC: 0 % (ref 0.0–0.2)

## 2020-08-01 NOTE — ED Notes (Signed)
Patient in restroom.

## 2020-08-01 NOTE — Discharge Instructions (Signed)
Call the GI specialist today and set up appointment as soon as able. We will call with stool and lab results and treat based on these. Continue to try and stay hydrated. If symptoms worsen prior to results returning go immediately to ER.

## 2020-08-01 NOTE — ED Triage Notes (Signed)
Pt presents with diarrhea xs 3 days. States was admitted into hospital in Sept with c-diff. States stool is dark and bad odor.

## 2020-08-02 ENCOUNTER — Telehealth (HOSPITAL_COMMUNITY): Payer: Self-pay | Admitting: Family Medicine

## 2020-08-02 LAB — GASTROINTESTINAL PANEL BY PCR, STOOL (REPLACES STOOL CULTURE)

## 2020-08-02 LAB — C DIFFICILE QUICK SCREEN W PCR REFLEX
C Diff antigen: POSITIVE — AB
C Diff interpretation: DETECTED
C Diff toxin: POSITIVE — AB

## 2020-08-02 MED ORDER — VANCOMYCIN HCL 125 MG PO CAPS: 125.0000 mg | ORAL_CAPSULE | Freq: Four times a day (QID) | ORAL | 0 refills | Status: DC

## 2020-08-02 NOTE — Telephone Encounter (Signed)
Called pt regarding continued positive c dif testing. Will restart PO vancomycin and have him f/u with PCP and GI Monday. Discussed strict ED precautions if worsening in any way, he states he feels well at this time and wishes not to go to ED.

## 2020-08-03 NOTE — ED Provider Notes (Signed)
[facility]    CSN: [id number] Arrival date & time: 08/01/20  1210      History   Chief Complaint Chief Complaint  Patient presents with  . Diarrhea    HPI Troy Sanchez is a 79 y.o. male.   Patient presenting today with 3 days of worsening watery diarrhea that is dark and significantly malodorous, decreased appetite, and mild fatigue. States he was admitted last month for sepsis due to c dif colitis and norovirus, sent home on 3 days of PO vancomycin which he completed and improved a bit but never had resolution of sxs. Has not yet followed up with PCP or GI for recheck and now sxs worsening again. States he's lost 40 lb since 6 weeks ago when this all started. Denies fever, chills, body aches, dizziness, syncope. Currently not taking anything OTC for sxs. No other known hx of chronic GI issues.      Past Medical History:  Diagnosis Date  . CAD (coronary artery disease) 07/02/2019  . Hiatal hernia   . HTN (hypertension) 07/02/2019  . Hypertension     Patient Active Problem List   Diagnosis Date Noted  . Malnutrition of moderate degree 06/28/2020  . Acute colitis 06/24/2020  . Sepsis ([place]) 06/24/2020  . Diarrhea 06/24/2020  . Generalized weakness 06/24/2020  . Microcytic anemia 06/24/2020  . Leukocytosis 06/24/2020  . Serum total bilirubin elevated 06/24/2020  . Dehydration 06/24/2020  . CAD (coronary artery disease) 07/02/2019  . HTN (hypertension) 07/02/2019  . 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 Dyspnea on exertion 05/16/2016  . 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 Postop check 03/22/2014  . Partial small bowel obstruction ([place]) 03/03/2014  . History of cholecystectomy 03/03/2014  . Hiatal hernia 03/03/2014  . Bladder mass 03/03/2014    Past Surgical History:  Procedure Laterality Date  . CARDIAC CATHETERIZATION N/A 05/18/2016   Procedure: Right/Left Heart Cath and Coronary Angiography;  Surgeon: Adrian Prows, MD;   Location: [place] [facility];  Service: Cardiovascular;  Laterality: N/A;  . CHOLECYSTECTOMY  2006  . GASTROSTOMY N/A 03/06/2014   Procedure: GASTROSTOMY;  Surgeon: [name], MD;  Location: [facility];  Service: General;  Laterality: N/A;  . HIATAL HERNIA REPAIR N/A 03/06/2014   Procedure: HERNIA REPAIR HIATAL;  Surgeon: [name], MD;  Location: [facility];  Service: General;  Laterality: N/A;  . LAPAROTOMY N/A 03/06/2014   Procedure: EXPLORATORY LAPAROTOMY;  Surgeon: [name], MD;  Location: [place];  Service: General;  Laterality: N/A;  . TRANSURETHRAL RESECTION OF PROSTATE N/A 08/15/2014   Procedure: TRANSURETHRAL RESECTION OF THE PROSTATE WITH GYRUS INSTRUMENTS;  Surgeon: Malka So, MD;  Location[facility];  Service: Urology;  Laterality: N/A;  . UMBILICAL HERNIA REPAIR         Home Medications    Prior to Admission medications   Medication Sig Start Date End Date Taking? Authorizing Provider  aspirin 325 MG tablet Take 325 mg by mouth daily.     [provider]  atorvastatin (LIPITOR) 10 MG tablet Take 10 mg by mouth daily.    [provider]  cholecalciferol (VITAMIN D) 1000 UNITS tablet Take 1,000 Units by mouth every morning.     [provider]  famotidine (PEPCID) 20 MG tablet Take 1 tablet (20 mg total) by mouth daily. 07/31/20 07/31/21  Doran Stabler, MD  furosemide (LASIX) 20 MG tablet Take 1 tablet (20 mg total) by mouth daily for  14 days. 07/04/20 07/18/20  Shelly Coss, MD  isosorbide mononitrate (IMDUR) 60 MG 24 hr tablet Take 60 mg by mouth daily.    [provider]  loperamide (IMODIUM) 2 MG capsule Take 2 mg by mouth 4 (four) times daily as needed for diarrhea or loose stools. 06/06/20   [provider]  Multiple Vitamin (MULTIVITAMIN WITH MINERALS) TABS tablet Take 1 tablet by mouth daily. 07/02/20   Swayze, Ava, DO  vancomycin (VANCOCIN) 125 MG capsule Take 1 capsule (125 mg total) by mouth 4 (four) times daily.  Through 07/04/2020 08/02/20   Volney American, PA-C    Family History History reviewed. No pertinent family history.  Social History Social History   Tobacco Use  . Smoking status: Never Smoker  . Smokeless tobacco: Never Used  Vaping Use  . Vaping Use: Never used  Substance Use Topics  . Alcohol use: Yes    Comment: ocassionally  . Drug use: No     Allergies   Patient has no known allergies.   Review of Systems Review of Systems PER HPI   Physical Exam Triage Vital Signs ED Triage Vitals  Enc Vitals Group     BP 08/01/20 1345 (!) 106/59     Pulse Rate 08/01/20 1345 76     Resp 08/01/20 1345 17     Temp 08/01/20 1345 99.2 F (37.3 C)     Temp Source 08/01/20 1345 Oral     SpO2 08/01/20 1345 98 %     Weight --      Height --      Head Circumference --      Peak Flow --      Pain Score 08/01/20 1344 0     Pain Loc --      Pain Edu? --      Excl. in [place]? --    No data found.  Updated Vital Signs BP (!) 106/59 (BP Location: Left Arm)   Pulse 76   Temp 99.2 F (37.3 C) (Oral)   Resp 17   SpO2 98%   Visual Acuity Right Eye Distance:   Left Eye Distance:   Bilateral Distance:    Right Eye Near:   Left Eye Near:    Bilateral Near:     Physical Exam Vitals and nursing note reviewed.  Constitutional:      Appearance: Normal appearance.  HENT:     Head: Atraumatic.  Eyes:     Extraocular Movements: Extraocular movements intact.     Conjunctiva/sclera: Conjunctivae normal.  Cardiovascular:     Rate and Rhythm: Normal rate and regular rhythm.  Pulmonary:     Effort: Pulmonary effort is normal.     Breath sounds: Normal breath sounds.  Abdominal:     Palpations: Abdomen is soft.     Tenderness: There is no abdominal tenderness. There is no right CVA tenderness, left CVA tenderness or guarding.     Comments: BSs hyperactive diffusely  Musculoskeletal:        General: Normal range of motion.     Cervical back: Normal range of motion and  neck supple.  Skin:    General: Skin is warm and dry.  Neurological:     General: No focal deficit present.     Mental Status: He is oriented to person, place, and time.  Psychiatric:        Mood and Affect: Mood normal.        Thought Content: Thought content normal.  Judgment: Judgment normal.    UC Treatments / Results  Labs (all labs ordered are listed, but only abnormal results are displayed) Labs Reviewed  C DIFFICILE QUICK SCREEN W PCR REFLEX - Abnormal; Notable for the following components:      Result Value   C Diff antigen POSITIVE (*)    C Diff toxin POSITIVE (*)    All other components within normal limits  CBC WITH DIFFERENTIAL/PLATELET - Abnormal; Notable for the following components:   WBC 15.2 (*)    Hemoglobin 10.2 (*)    HCT 33.0 (*)    MCV 76.6 (*)    MCH 23.7 (*)    RDW 20.1 (*)    Neutro Abs 12.6 (*)    Abs Immature Granulocytes 0.08 (*)    All other components within normal limits  COMPREHENSIVE METABOLIC PANEL - Abnormal; Notable for the following components:   Glucose, Bld 102 (*)    Calcium 8.7 (*)    Total Protein 6.1 (*)    Albumin 2.7 (*)    Total Bilirubin 1.4 (*)    All other components within normal limits  GASTROINTESTINAL PANEL BY PCR, STOOL (REPLACES STOOL CULTURE)    EKG   Radiology No results found.  Procedures Procedures (including critical care time)  Medications Ordered in UC Medications - No data to display  Initial Impression / Assessment and Plan / UC Course  I have reviewed the triage vital signs and the nursing notes.  Pertinent labs & imaging results that were available during my care of the patient were reviewed by me and considered in my medical decision making (see chart for details).     Vitals overall stable other than some mild hypotension, exam reassuring and stable. Blood work and stool studies pending but suspect c dif infection did not resolve from recent hospitalization. He declines restarting  abx prior to results returning, will await these. He is stable for now for discharge but gave strict return precautions to ED for worsening sxs.   Final Clinical Impressions(s) / UC Diagnoses   Final diagnoses:  Diarrhea, unspecified type  History of Clostridioides difficile colitis     Discharge Instructions     Call the GI specialist today and set up appointment as soon as able. We will call with stool and lab results and treat based on these. Continue to try and stay hydrated. If symptoms worsen prior to results returning go immediately to ER.     ED Prescriptions    None     PDMP not reviewed this encounter.   Volney American, [place] 08/03/20 1952

## 2020-08-07 DIAGNOSIS — D508 Other iron deficiency anemias: Secondary | ICD-10-CM | POA: Diagnosis not present

## 2020-08-07 DIAGNOSIS — Z1212 Encounter for screening for malignant neoplasm of rectum: Secondary | ICD-10-CM | POA: Diagnosis not present

## 2020-08-12 DIAGNOSIS — Z79899 Other long term (current) drug therapy: Secondary | ICD-10-CM | POA: Diagnosis not present

## 2020-08-12 DIAGNOSIS — G5732 Lesion of lateral popliteal nerve, left lower limb: Secondary | ICD-10-CM | POA: Diagnosis not present

## 2020-08-12 DIAGNOSIS — M21372 Foot drop, left foot: Secondary | ICD-10-CM | POA: Diagnosis not present

## 2020-08-12 DIAGNOSIS — M5000 Cervical disc disorder with myelopathy, unspecified cervical region: Secondary | ICD-10-CM | POA: Diagnosis not present

## 2020-08-17 ENCOUNTER — Other Ambulatory Visit: Payer: Self-pay | Admitting: Cardiology

## 2020-08-18 ENCOUNTER — Ambulatory Visit: Payer: Medicare Other | Admitting: Physician Assistant

## 2020-08-19 ENCOUNTER — Ambulatory Visit: Payer: Medicare Other | Admitting: Internal Medicine

## 2020-08-21 ENCOUNTER — Ambulatory Visit (HOSPITAL_COMMUNITY)
Admission: EM | Admit: 2020-08-21 | Discharge: 2020-08-21 | Disposition: A | Discharge status: Home / Self Care | Payer: Medicare Other | Attending: Family Medicine | Admitting: Family Medicine

## 2020-08-21 ENCOUNTER — Encounter (HOSPITAL_COMMUNITY): Payer: Self-pay

## 2020-08-21 ENCOUNTER — Telehealth (HOSPITAL_COMMUNITY): Payer: Self-pay | Admitting: Family Medicine

## 2020-08-21 ENCOUNTER — Other Ambulatory Visit: Payer: Self-pay

## 2020-08-21 ENCOUNTER — Ambulatory Visit (INDEPENDENT_AMBULATORY_CARE_PROVIDER_SITE_OTHER): Payer: Medicare Other

## 2020-08-21 DIAGNOSIS — R0602 Shortness of breath: Secondary | ICD-10-CM | POA: Diagnosis not present

## 2020-08-21 DIAGNOSIS — Z20822 Contact with and (suspected) exposure to covid-19: Secondary | ICD-10-CM | POA: Insufficient documentation

## 2020-08-21 DIAGNOSIS — J9811 Atelectasis: Secondary | ICD-10-CM | POA: Diagnosis not present

## 2020-08-21 DIAGNOSIS — R059 Cough, unspecified: Secondary | ICD-10-CM | POA: Diagnosis not present

## 2020-08-21 DIAGNOSIS — R531 Weakness: Secondary | ICD-10-CM | POA: Insufficient documentation

## 2020-08-21 DIAGNOSIS — R197 Diarrhea, unspecified: Secondary | ICD-10-CM | POA: Diagnosis present

## 2020-08-21 DIAGNOSIS — J90 Pleural effusion, not elsewhere classified: Secondary | ICD-10-CM | POA: Diagnosis not present

## 2020-08-21 LAB — CBC WITH DIFFERENTIAL/PLATELET
Abs Immature Granulocytes: 0.03 10*3/uL (ref 0.00–0.07)
Basophils Absolute: 0 10*3/uL (ref 0.0–0.1)
Basophils Relative: 0 %
Eosinophils Absolute: 0 10*3/uL (ref 0.0–0.5)
Eosinophils Relative: 0 %
HCT: 37.3 % — ABNORMAL LOW (ref 39.0–52.0)
Hemoglobin: 11.5 g/dL — ABNORMAL LOW (ref 13.0–17.0)
Immature Granulocytes: 0 %
Lymphocytes Relative: 9 %
Lymphs Abs: 0.8 10*3/uL (ref 0.7–4.0)
MCH: 25.2 pg — ABNORMAL LOW (ref 26.0–34.0)
MCHC: 30.8 g/dL (ref 30.0–36.0)
MCV: 81.6 fL (ref 80.0–100.0)
Monocytes Absolute: 0.5 10*3/uL (ref 0.1–1.0)
Monocytes Relative: 6 %
Neutro Abs: 7.8 10*3/uL — ABNORMAL HIGH (ref 1.7–7.7)
Neutrophils Relative %: 85 %
Platelets: 249 10*3/uL (ref 150–400)
RBC: 4.57 MIL/uL (ref 4.22–5.81)
RDW: 23.9 % — ABNORMAL HIGH (ref 11.5–15.5)
WBC: 9.2 10*3/uL (ref 4.0–10.5)
nRBC: 0 % (ref 0.0–0.2)

## 2020-08-21 LAB — COMPREHENSIVE METABOLIC PANEL
ALT: 14 U/L (ref 0–44)
AST: 23 U/L (ref 15–41)
Albumin: 3.3 g/dL — ABNORMAL LOW (ref 3.5–5.0)
Alkaline Phosphatase: 61 U/L (ref 38–126)
Anion gap: 9 (ref 5–15)
BUN: 12 mg/dL (ref 8–23)
CO2: 27 mmol/L (ref 22–32)
Calcium: 9.2 mg/dL (ref 8.9–10.3)
Chloride: 103 mmol/L (ref 98–111)
Creatinine, Ser: 0.94 mg/dL (ref 0.61–1.24)
GFR, Estimated: >60 mL/min (ref >60)
Glucose, Bld: 101 mg/dL — ABNORMAL HIGH (ref 70–99)
Potassium: 3.9 mmol/L (ref 3.5–5.1)
Sodium: 139 mmol/L (ref 135–145)
Total Bilirubin: 2 mg/dL — ABNORMAL HIGH (ref 0.3–1.2)
Total Protein: 6.3 g/dL — ABNORMAL LOW (ref 6.5–8.1)

## 2020-08-21 LAB — C DIFFICILE QUICK SCREEN W PCR REFLEX
C Diff antigen: POSITIVE — AB
C Diff interpretation: DETECTED
C Diff toxin: POSITIVE — AB

## 2020-08-21 LAB — RESP PANEL BY RT PCR (RSV, FLU A&B, COVID)
Influenza A by PCR: NEGATIVE
Influenza B by PCR: NEGATIVE
Respiratory Syncytial Virus by PCR: NEGATIVE
SARS Coronavirus 2 by RT PCR: NEGATIVE

## 2020-08-21 MED ORDER — VANCOMYCIN HCL 125 MG PO CAPS: 125.0000 mg | ORAL_CAPSULE | Freq: Four times a day (QID) | ORAL | 0 refills | Status: AC

## 2020-08-21 MED ORDER — ACETAMINOPHEN 325 MG PO TABS
ORAL_TABLET | ORAL | Status: AC
  Filled 2020-08-21: qty 2

## 2020-08-21 MED ORDER — ACETAMINOPHEN 325 MG PO TABS
650.0000 mg | ORAL_TABLET | Freq: Once | ORAL | Status: AC
  Administered 2020-08-21: 650 mg via ORAL

## 2020-08-21 NOTE — ED Triage Notes (Signed)
Pt reports having diarrhea dn feeling weak x 1 day. States stools are dark, watery and malodorous. Pt reports he used restroom over 5 times yesterday and 3 time this morning. Pt thin he is having c-diff as he was treated fot on 08/01/2020 for the same complaints.  Denies fever, chills, abdominal pian.

## 2020-08-21 NOTE — Telephone Encounter (Signed)
Went over labs with pt.  C diff positive Consulted with Janett Billow PA from [facility] Started on vancomycin  X  2 weeks and will have him see GI on the 22nd as planned

## 2020-08-21 NOTE — ED Provider Notes (Signed)
[facility]    CSN: [id number] Arrival date & time: 08/21/20  8453      History   Chief Complaint Chief Complaint  Patient presents with  . Diarrhea  . Weakness    HPI Troy Sanchez is a 79 y.o. male.   Patient is a 79 year old male with past medical history of CAD, hypertension, failure, sepsis, diarrhea, C. difficile, partial small bowel obstruction, cholecystectomy.  He presents today with roughly 1 day, diarrhea, weakness.  Reporting stools are dark, watery and malodorous.  He used bathroom 5 times yesterday and 3 times approximately.  Patient recently hospitalized in September and treated for C. difficile infection.  Was seen here on 10/22 and treated for continued C. difficile with 2 weeks of vancomycin.  Went to his doctor for follow-up and reports the infection had cleared and his stool sample was normal.  He was feeling better.  He has had some over the last fatigue, weakness, body aches. Mild cough over the last few days with some shortness of breath. Fever of 101.2 here today. Had covid in may of this year and had 1st vaccine in august. Has not had second vaccine.      Past Medical History:  Diagnosis Date  . CAD (coronary artery disease) 07/02/2019  . Hiatal hernia   . HTN (hypertension) 07/02/2019  . Hypertension     Patient Active Problem List   Diagnosis Date Noted  . Malnutrition of moderate degree 06/28/2020  . Acute colitis 06/24/2020  . Sepsis ([place]) 06/24/2020  . Diarrhea 06/24/2020  . Generalized weakness 06/24/2020  . Microcytic anemia 06/24/2020  . Leukocytosis 06/24/2020  . Serum total bilirubin elevated 06/24/2020  . Dehydration 06/24/2020  . CAD (coronary artery disease) 07/02/2019  . HTN (hypertension) 07/02/2019  . 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 Dyspnea on exertion 05/16/2016  . 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 Postop check 03/22/2014  . Partial small bowel obstruction ([place]) 03/03/2014    . History of cholecystectomy 03/03/2014  . Hiatal hernia 03/03/2014  . Bladder mass 03/03/2014    Past Surgical History:  Procedure Laterality Date  . CARDIAC CATHETERIZATION N/A 05/18/2016   Procedure: Right/Left Heart Cath and Coronary Angiography;  Surgeon: Adrian Prows, MD;  Location: [facility] [facility];  Service: Cardiovascular;  Laterality: N/A;  . CHOLECYSTECTOMY  2006  . GASTROSTOMY N/A 03/06/2014   Procedure: GASTROSTOMY;  Surgeon: [name], MD;  Location: [facility];  Service: General;  Laterality: N/A;  . HIATAL HERNIA REPAIR N/A 03/06/2014   Procedure: HERNIA REPAIR HIATAL;  Surgeon: [name], MD;  Location: [facility];  Service: General;  Laterality: N/A;  . LAPAROTOMY N/A 03/06/2014   Procedure: EXPLORATORY LAPAROTOMY;  Surgeon: [name], MD;  Location: [facility];  Service: General;  Laterality: N/A;  . TRANSURETHRAL RESECTION OF PROSTATE N/A 08/15/2014   Procedure: TRANSURETHRAL RESECTION OF THE PROSTATE WITH GYRUS INSTRUMENTS;  Surgeon: Malka So, MD;  [facility];  Service: Urology;  Laterality: N/A;  . UMBILICAL HERNIA REPAIR         Home Medications    Prior to Admission medications   Medication Sig Start Date End Date Taking? Authorizing Provider  aspirin 325 MG tablet Take 325 mg by mouth daily.     [provider]  atorvastatin (LIPITOR) 10 MG tablet Take 10 mg by mouth daily.    [provider]  cholecalciferol (VITAMIN D) 1000 UNITS tablet Take 1,000 Units by mouth every  morning.     [provider]  famotidine (PEPCID) 20 MG tablet Take 1 tablet (20 mg total) by mouth daily. 07/31/20 07/31/21  Doran Stabler, MD  furosemide (LASIX) 20 MG tablet Take 1 tablet (20 mg total) by mouth daily for 14 days. 07/04/20 07/18/20  Shelly Coss, MD  isosorbide mononitrate (IMDUR) 60 MG 24 hr tablet Take 60 mg by mouth daily.    [provider]  loperamide (IMODIUM) 2 MG capsule Take 2 mg by mouth 4 (four) times daily as needed  for diarrhea or loose stools. 06/06/20   [provider]  Multiple Vitamin (MULTIVITAMIN WITH MINERALS) TABS tablet Take 1 tablet by mouth daily. 07/02/20   Swayze, Ava, DO  vancomycin (VANCOCIN) 125 MG capsule Take 1 capsule (125 mg total) by mouth 4 (four) times daily. Through 07/04/2020 08/02/20   Volney American, PA-C    Family History History reviewed. No pertinent family history.  Social History Social History   Tobacco Use  . Smoking status: Never Smoker  . Smokeless tobacco: Never Used  Vaping Use  . Vaping Use: Never used  Substance Use Topics  . Alcohol use: Yes    Comment: ocassionally  . Drug use: No     Allergies   Patient has no known allergies.   Review of Systems Review of Systems   Physical Exam Triage Vital Signs ED Triage Vitals  Enc Vitals Group     BP 08/21/20 0952 112/65     Pulse Rate 08/21/20 0952 88     Resp 08/21/20 0952 (!) 23     Temp 08/21/20 0952 (!) 101.2 F (38.4 C)     Temp Source 08/21/20 0952 Oral     SpO2 08/21/20 0952 100 %     Weight --      Height --      Head Circumference --      Peak Flow --      Pain Score 08/21/20 0950 0     Pain Loc --      Pain Edu? --      Excl. in [place]? --    No data found.  Updated Vital Signs BP 112/65 (BP Location: Right Arm)   Pulse 88   Temp (!) 100.6 F (38.1 C) (Oral)   Resp (!) 23   SpO2 100%   Visual Acuity Right Eye Distance:   Left Eye Distance:   Bilateral Distance:    Right Eye Near:   Left Eye Near:    Bilateral Near:     Physical Exam Constitutional:      General: He is not in acute distress.    Appearance: He is ill-appearing. He is not toxic-appearing or diaphoretic.  HENT:     Head: Normocephalic and atraumatic.  Eyes:     Conjunctiva/sclera: Conjunctivae normal.  Cardiovascular:     Rate and Rhythm: Normal rate and regular rhythm.  Pulmonary:     Effort: Pulmonary effort is normal.     Breath sounds: Normal breath sounds.     Comments:  Decreased lung sounds in the bases  Musculoskeletal:        General: Normal range of motion.  Skin:    General: Skin is warm and dry.  Neurological:     General: No focal deficit present.  Psychiatric:        Mood and Affect: Mood normal.      UC Treatments / Results  Labs (all labs ordered are listed, but only  abnormal results are displayed) Labs Reviewed  C DIFFICILE QUICK SCREEN W PCR REFLEX  RESP PANEL BY RT PCR (RSV, FLU A&B, COVID)  CBC WITH DIFFERENTIAL/PLATELET  COMPREHENSIVE METABOLIC PANEL    EKG   Radiology DG Chest 2 View  Result Date: 08/21/2020 CLINICAL DATA:  Shortness of breath, cough. EXAM: CHEST - 2 VIEW COMPARISON:  November 04, 2017. FINDINGS: Stable cardiomediastinal silhouette. No pneumothorax or pleural effusion is noted. Stable bibasilar subsegmental atelectasis or scarring is noted. Bony thorax is unremarkable. IMPRESSION: Stable bibasilar subsegmental atelectasis or scarring. Electronically Signed   By: Marijo Conception M.D.   On: 08/21/2020 10:58    Procedures Procedures (including critical care time)  Medications Ordered in UC Medications  acetaminophen (TYLENOL) tablet 650 mg (650 mg Oral Given 08/21/20 1023)    Initial Impression / Assessment and Plan / UC Course  I have reviewed the triage vital signs and the nursing notes.  Pertinent labs & imaging results that were available during my care of the patient were reviewed by me and considered in my medical decision making (see chart for details).     79 year old male with past medical history of diarrhea, C. difficile, weakness. Was recently cleared for C. difficile infection with repeat stool sample done a few weeks ago.  Started feeling sick yesterday with fever, chills, body aches, diarrhea.  No abdominal pain, vomiting. X-ray today did not reveal any acute infection or other concerns. We are doing another stool sample to check for C. difficile and basic lab work Also checking for  flu and Covid. Recommended rest, increase fluids to rehydrate and Tylenol every 8 hours for fever For any worsening symptoms go to the ER Final Clinical Impressions(s) / UC Diagnoses   Final diagnoses:  Weakness  Diarrhea, unspecified type     Discharge Instructions     Your x-ray did not show any signs of infection. Your lab work is still pending.  I will call you with any concerning results. We are also checking you for flu and Covid.  We should have these results back in a couple hours. Would like to retest her stool with you are able to give [place] a sample today. Rest, increase fluid intake to include water and Gatorade to stay hydrated and replace electrolytes Rest, Tylenol every 8 hours For worsening symptoms please go to the ER    ED Prescriptions    None     PDMP not reviewed this encounter.   Orvan July, NP 08/21/20 1218

## 2020-08-21 NOTE — Discharge Instructions (Addendum)
Your x-ray did not show any signs of infection. Your lab work is still pending.  I will call you with any concerning results. We are also checking you for flu and Covid.  We should have these results back in a couple hours. Would like to retest her stool with you are able to give [place] a sample today. Rest, increase fluid intake to include water and Gatorade to stay hydrated and replace electrolytes Rest, Tylenol every 8 hours For worsening symptoms please go to the ER

## 2020-08-22 ENCOUNTER — Other Ambulatory Visit: Payer: Self-pay | Admitting: Gastroenterology

## 2020-09-01 ENCOUNTER — Encounter: Payer: Self-pay | Admitting: Physician Assistant

## 2020-09-01 ENCOUNTER — Ambulatory Visit: Payer: Medicare Other | Admitting: Physician Assistant

## 2020-09-01 DIAGNOSIS — A0472 Enterocolitis due to Clostridium difficile, not specified as recurrent: Secondary | ICD-10-CM | POA: Diagnosis not present

## 2020-09-01 MED ORDER — VANCOMYCIN HCL 125 MG PO CAPS: ORAL_CAPSULE | ORAL | 0 refills | Status: AC

## 2020-09-01 MED ORDER — SACCHAROMYCES BOULARDII 250 MG PO CAPS: 250.0000 mg | ORAL_CAPSULE | Freq: Two times a day (BID) | ORAL | 2 refills | Status: DC

## 2020-09-01 NOTE — Progress Notes (Signed)
Subjective:    Patient ID: Troy Sanchez, male    DOB: 05-25-1941, 79 y.o.   MRN: [id number]  HPI Troy Sanchez is a pleasant 79 year old white male, established with Dr. Loletha Carrow who is referred back today with relapsing C. difficile colitis.  His PCP is Dr.Pharr..  He had a recent urgent care visit through [facility] on 08/21/2020 which prompted referral. Patient was initially admitted to the hospital in September 2021 with sepsis secondary to C. difficile and had CT scan at that time showing a diffuse colitis.  Stool for C. difficile PCR was positive and he was treated with a 14-day course of vancomycin. He had recurrence of diarrhea on 08/01/2020 and was given another 2-week course of vancomycin.  He says symptoms improved fairly quickly after restarting vancomycin and diarrhea resolves.  He reports being tested for C. difficile after he completed that course and test was negative.  A few days later he developed acute diarrhea very reminiscent of the previous C. difficile and then fever to 101.  He was seen in urgent care and stool for C. difficile quick screen was done with positive C. difficile antigen and toxin, and again is being treated with a course of vancomycin which he will complete later this week. He says he is feeling pretty good overall though remains fatigued since his initial illness which was more severe.  His diarrhea has resolved and he is having fairly normal bowel movements.  He denies any melena or hematochezia.  No abdominal pain or cramping no nausea or vomiting.  No further fever or chills.  Appetite has been fair. Labs on 08/21/2020 WBC of 9.2, hemoglobin 11.5/hematocrit of 37, creatinine 0.94 electrolytes within normal limits.  He has not had colonoscopy since 2004 at which time he had 5 small polyps removed, no path available. EGD in 2019 per Dr. Loletha Carrow with finding of grade B esophagitis.     Review of Systems Pertinent positive and negative review of systems were noted in  the above HPI section.  All other review of systems was otherwise negative.  Outpatient Encounter Medications as of 09/01/2020  Medication Sig  . aspirin EC 81 MG tablet Take 81 mg by mouth daily. Swallow whole.  Marland Kitchen atorvastatin (LIPITOR) 10 MG tablet Take 10 mg by mouth daily.  . cholecalciferol (VITAMIN D) 1000 UNITS tablet Take 1,000 Units by mouth every morning.   . famotidine (PEPCID) 20 MG tablet TAKE 1 TABLET BY MOUTH EVERY DAY  . ferrous sulfate 325 (65 FE) MG tablet Take 1 tablet by mouth every other day.  . isosorbide mononitrate (IMDUR) 60 MG 24 hr tablet Take 60 mg by mouth daily.  . Multiple Vitamin (MULTIVITAMIN WITH MINERALS) TABS tablet Take 1 tablet by mouth daily.  . vancomycin (VANCOCIN HCL) 125 MG capsule Take 1 capsule (125 mg total) by mouth 4 (four) times daily for 14 days.  Marland Kitchen saccharomyces boulardii (FLORASTOR) 250 MG capsule Take 1 capsule (250 mg total) by mouth 2 (two) times daily.  . vancomycin (VANCOCIN) 125 MG capsule Take 1 capsule (125 mg total) by mouth in the morning, at noon, and at bedtime for 14 days, THEN 1 capsule (125 mg total) 2 (two) times daily for 7 days, THEN 1 capsule (125 mg total) daily for 7 days.  . [DISCONTINUED] aspirin 325 MG tablet Take 325 mg by mouth daily.   . [DISCONTINUED] furosemide (LASIX) 20 MG tablet Take 1 tablet (20 mg total) by mouth daily for 14 days.  . [  DISCONTINUED] loperamide (IMODIUM) 2 MG capsule Take 2 mg by mouth 4 (four) times daily as needed for diarrhea or loose stools.   No facility-administered encounter medications on file as of 09/01/2020.   No Known Allergies Patient Active Problem List   Diagnosis Date Noted  . Enteritis due to Clostridium difficile 09/01/2020  . Malnutrition of moderate degree 06/28/2020  . Acute colitis 06/24/2020  . Sepsis ([place]) 06/24/2020  . Diarrhea 06/24/2020  . Generalized weakness 06/24/2020  . Microcytic anemia 06/24/2020  . Leukocytosis 06/24/2020  . Serum total bilirubin  elevated 06/24/2020  . Dehydration 06/24/2020  . CAD (coronary artery disease) 07/02/2019  . HTN (hypertension) 07/02/2019  . 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 Dyspnea on exertion 05/16/2016  . 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 Postop check 03/22/2014  . Partial small bowel obstruction ([facility]) 03/03/2014  . History of cholecystectomy 03/03/2014  . Hiatal hernia 03/03/2014  . Bladder mass 03/03/2014   Social History   Socioeconomic History  . Marital status: Married    Spouse name: Not on file  . Number of children: 2  . Years of education: Not on file  . Highest education level: Not on file  Occupational History  . Not on file  Tobacco Use  . Smoking status: Never Smoker  . Smokeless tobacco: Never Used  Vaping Use  . Vaping Use: Never used  Substance and Sexual Activity  . Alcohol use: Yes    Comment: ocassionally  . Drug use: No  . Sexual activity: Never  Other Topics Concern  . Not on file  Social History Narrative  . Not on file   Social Determinants of Health   Financial Resource Strain:   . Difficulty of Paying Living Expenses: Not on file  Food Insecurity:   . Worried About Charity fundraiser in the Last Year: Not on file  . Ran Out of Food in the Last Year: Not on file  Transportation Needs:   . Lack of Transportation (Medical): Not on file  . Lack of Transportation (Non-Medical): Not on file  Physical Activity:   . Days of Exercise per Week: Not on file  . Minutes of Exercise per Session: Not on file  Stress:   . Feeling of Stress : Not on file  Social Connections:   . Frequency of Communication with Friends and Family: Not on file  . Frequency of Social Gatherings with Friends and Family: Not on file  . Attends Religious Services: Not on file  . Active Member of Clubs or Organizations: Not on file  . Attends Archivist Meetings: Not on file  . Marital Status: Not on file  Intimate Partner Violence:     . Fear of Current or Ex-Partner: Not on file  . Emotionally Abused: Not on file  . Physically Abused: Not on file  . Sexually Abused: Not on file    Mr. Bringhurst's family history is not on file.      Objective:    Vitals:   09/01/20 0839  BP: 92/60  Pulse: 80    Physical Exam Well-developed well-nourished  eld WM in no acute distress.Pleasant   Height, Weight,175 BMI 26.14  HEENT; nontraumatic normocephalic, EOMI, PER R LA, sclera anicteric. Oropharynx;not examined Neck; supple, no JVD Cardiovascular; regular rate and rhythm with S1-S2, no murmur rub or gallop Pulmonary; Clear bilaterally Abdomen; soft, nontender, nondistended, no palpable mass or hepatosplenomegaly, bowel sounds are active/hperactive Rectal;not done Skin; benign exam, no jaundice rash  or appreciable lesions Extremities; no clubbing cyanosis or edema skin warm and dry Neuro/Psych; alert and oriented x4, grossly nonfocal mood and affect appropriate       Assessment & Plan:   #40 79 year old white male with relapsing C. difficile colitis.  He is currently taking his third round of vancomycin.  Patient responds and diarrhea resolves with each course only to return within a week or so of finishing antibiotics.  #2 GERD with history of esophagitis #3 colon cancer screening-last colonoscopy 2004 5 small polyps removed #4 coronary artery disease 5.  Prior history of partial small bowel obstruction 6.  Status post cholecystectomy  Plan; we will plan a total of 6 weeks tapered course of vancomycin.  Patient will finish 14 days of vancomycin 125 mg p.o. 4 times daily later this week, then start vancomycin 125 mg p.o. 3 times daily x2 weeks, then vancomycin 125 mg p.o. twice daily x1 week, then vancomycin 125 mg p.o. daily x1 week, then stop  Start Florastor 1 p.o. twice daily x6 to 8 weeks We will plan office follow-up with Dr. Loletha Carrow or myself in about 5 weeks.  Patient advised to call in the interim for any  resurgence of symptoms. Note that patient had been called in a course of Dificid by PCP which his insurance would not cover and this was going to be $1300.00 Will eventually need to rediscuss colonoscopy for screening but  not until he has completely resolved the C. difficile colitis.   Lynnley Doddridge Genia Harold PA-C 09/01/2020   Cc: Deland Pretty, MD

## 2020-09-01 NOTE — Patient Instructions (Addendum)
If you are age 78 or older, your body mass index should be between 23-30. Your Body mass index is 26.14 kg/m. If this is out of the aforementioned range listed, please consider follow up with your Primary Care Provider.  If you are age 52 or younger, your body mass index should be between 19-25. Your Body mass index is 26.14 kg/m. If this is out of the aformentioned range listed, please consider follow up with your Primary Care Provider.   Start Florastar 1 capsule twice daily for the nest 2 months.  Continue your current round of Vancomycin. Then start Vancomycin 125 mg taper: 1  Capsule three times daily for 2 weeks, then 1 capsule twice daily for 1 week, then 1 capsule daily for 1 week, then stop.  You have been scheduled to follow up with Nicoletta Ba, PA-C on October 08, 2020 2:30 pm.

## 2020-09-02 NOTE — Progress Notes (Signed)
____________________________________________________________  Attending physician addendum:  Thank you for sending this case to me. I have reviewed the entire note and agree with the plan.  He also needs a referral to Infectious Disease - please arrange. Unfortunate that Dificid was prohibitively expensive.  This would be an appropriate case for FMT, but I believe that is not currently available due to lack of stool bank donor specimens.  All the more reason to consult with ID.  Wilfrid Lund, MD  ____________________________________________________________

## 2020-09-08 ENCOUNTER — Telehealth: Payer: Self-pay

## 2020-09-08 ENCOUNTER — Telehealth: Payer: Self-pay | Admitting: Physician Assistant

## 2020-09-08 ENCOUNTER — Other Ambulatory Visit: Payer: Self-pay

## 2020-09-08 DIAGNOSIS — Z8619 Personal history of other infectious and parasitic diseases: Secondary | ICD-10-CM | POA: Diagnosis not present

## 2020-09-08 DIAGNOSIS — D508 Other iron deficiency anemias: Secondary | ICD-10-CM | POA: Diagnosis not present

## 2020-09-08 DIAGNOSIS — J849 Interstitial pulmonary disease, unspecified: Secondary | ICD-10-CM | POA: Diagnosis not present

## 2020-09-08 DIAGNOSIS — A0472 Enterocolitis due to Clostridium difficile, not specified as recurrent: Secondary | ICD-10-CM

## 2020-09-08 NOTE — Telephone Encounter (Signed)
Explained the role of an Infectious Disease Provider for his situation.

## 2020-09-08 NOTE — Telephone Encounter (Signed)
Patient informed. Agrees to this plan. Referral placed.

## 2020-09-08 NOTE — Telephone Encounter (Signed)
-----   Message from Alfredia Ferguson, PA-C sent at 09/03/2020  2:07 PM EST ----- Regarding: FW: ID consult Beth , I saw this pt with relapsing cdiff in clinic earlier this week - please get him an appt with ID just in case this long tapered course of Vanco does not eradicate  the Cdiff- please let pt know , and let him know I discussed with dr Loletha Carrow - thanks  ----- Message ----- From: Doran Stabler, MD Sent: 09/02/2020   8:06 AM EST To: Alfredia Ferguson, PA-C Subject: ID consult                                     Amy,    Please see my addendum to your note and ask your clinical assistant to send a referral to ID for this man (and let him know we are doing it).  Thanks  - HD

## 2020-09-16 ENCOUNTER — Telehealth: Payer: Self-pay

## 2020-09-16 ENCOUNTER — Encounter: Payer: Self-pay | Admitting: Infectious Diseases

## 2020-09-16 ENCOUNTER — Ambulatory Visit: Payer: Medicare Other | Admitting: Infectious Diseases

## 2020-09-16 ENCOUNTER — Other Ambulatory Visit: Payer: Self-pay

## 2020-09-16 ENCOUNTER — Other Ambulatory Visit: Payer: Self-pay | Admitting: Gastroenterology

## 2020-09-16 VITALS — BP 126/77 | HR 66 | Temp 97.8°F | Wt 179.0 lb

## 2020-09-16 DIAGNOSIS — Z2821 Immunization not carried out because of patient refusal: Secondary | ICD-10-CM | POA: Insufficient documentation

## 2020-09-16 DIAGNOSIS — A0472 Enterocolitis due to Clostridium difficile, not specified as recurrent: Secondary | ICD-10-CM | POA: Diagnosis not present

## 2020-09-16 DIAGNOSIS — A0471 Enterocolitis due to Clostridium difficile, recurrent: Secondary | ICD-10-CM | POA: Diagnosis not present

## 2020-09-16 MED ORDER — VANCOMYCIN HCL 125 MG PO CAPS: 125.0000 mg | ORAL_CAPSULE | Freq: Every day | ORAL | 0 refills | Status: DC

## 2020-09-16 MED ORDER — VANCOMYCIN HCL 125 MG PO CAPS: 125.0000 mg | ORAL_CAPSULE | ORAL | 1 refills | Status: DC

## 2020-09-16 NOTE — Assessment & Plan Note (Signed)
Continue tapering dose of PO Vancomycin FU in 2 weeks

## 2020-09-16 NOTE — Progress Notes (Signed)
[facility]                                                             [place], [place], [place], [address]                                                                  Phn. [phone]; Fax: [phone]                                                                             Date: 09/16/2020   Reason for Referral: recurrent C difficile infection  Referring Provider: Tomasa Hose  Assessment 1. 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 Diarrhea resolved. H/o use of antibiotics prior. 08/01/20 C diff ag and toxin positive. Treated with 2 weeks course of PO Vancomycin. Diarrhea resolved. Difficulty with [place] for Fidaxomicin 08/21/20 C diff ag and toxin positive- Started on PO Vancomycin for 2 weeks followed by tapering dose by [facility]  Patient has already completed 14 days of PO Vancomycin and is currently taking PO Vancomycin 3 times daily started on 11/26.   Denies any fever, chills, sweats, nausea, vomiting, abdominal pain. Diarrhea resolved aftre initiating PO Vancomycin within first few days of start.  2. Health Maintenance - Declined Flu and COVID vaccines    Plan Continue Vancomycin 125,mg PO three times a day from 11/26 for 14 days. End date 12/10 Vancomycin 125mg  PO BID for 7 days from 12/11. End date 09/26/20 Vancomycin 125mg  po daily for 7 days from 12/18. End date 12/24 Vancomycin 125mg  PO every alternate days thereaftre until next clinic visit Follow up in 2 weeks   All questions and concerns were discussed and addressed. Patient verbalized understanding of the plan  ____________________________________________________________________________________________________________________  HPI: 79 year old male with PMH of pulmonary hypertension, hypertension, coronary arterery disease,  hyperlipidemia, GERD who is referred to [facility] for evaluation of recurrent C diff.  Patient was earlier admitted 9/14-9/24/21 at the hospital with complaints of 3-week history of diarrhea, progressive generalized weakness.  Labs remarkable for leukocytosis and AKI. CT abdomen/pelvis was consistent with diffuse colitis.  He completed a course of 10 days of Vancomycin 125mg  PO qid and was discharged on 9/24. Per notes, diarrhea stopped at the time of discharge.  Of note, patient also tested positive for Noro virus during this hospitalization.  Patient presented to the ED on 10/22 with 3 days of worsening watery diarrhea where he said that although his diarrhea slowed down after his treatment, it did not complete went away. Tested positive again for C diff and course of PO Vancomycin was prescribed which he completed for 2 weeks and felt better.   Another ED  visit 11/11 tested positive for C diff again when he presented with 1 day of diarrhea. He was seen by GI in 11/22 and was started on a tapered dose of PO Vancomycin given Fidaxomicin was not covered by his insurance and FMT not available per GI due to unavailability of fecal donor during [place].   Patient has already completed 14 days of PO Vancomycin and is currently taking PO Vancomycin 3 times daily started on 11/26.  Denies any fever, chills, sweats, nausea, vomiting, abdominal pain. Diarrhea resolved aftre initiating PO Vancomycin within first few days of start.  ROS: Constitutional: Negative for fever, chills, activity change, appetite change, fatigue and unexpected weight change.  HENT: Negative for congestion, sore throat, rhinorrhea, sneezing, trouble swallowing and sinus pressure.  Eyes: Negative for photophobia and visual disturbance.  Respiratory: Negative for cough, chest tightness,  wheezing and stridor. MILD SOB Cardiovascular: Negative for chest pain, palpitations and leg swelling.  Gastrointestinal: Negative for nausea, vomiting,  abdominal pain, diarrhea, constipation, blood in stool, abdominal distention and anal bleeding.  Genitourinary: Negative for dysuria, hematuria, flank pain and difficulty urinating.  Musculoskeletal: Negative for myalgias, back pain, joint swelling, arthralgias and gait problem.  Skin: Negative for color change, pallor, rash and wound.  Neurological: Negative for dizziness, tremors, weakness and light-headedness.  Hematological: Negative for adenopathy. Does not bruise/bleed easily.  Psychiatric/Behavioral: Negative for behavioral problems, confusion, sleep disturbance, dysphoric mood, decreased concentration and agitation.    Past Medical History:  Diagnosis Date  . C. difficile diarrhea   . CAD (coronary artery disease) 07/02/2019  . Hiatal hernia   . HTN (hypertension) 07/02/2019  . Hypertension    Past Surgical History:  Procedure Laterality Date  . CARDIAC CATHETERIZATION N/A 05/18/2016   Procedure: Right/Left Heart Cath and Coronary Angiography;  Surgeon: Adrian Prows, MD;  Location: [facility];  Service: Cardiovascular;  Laterality: N/A;  . CHOLECYSTECTOMY  2006  . GASTROSTOMY N/A 03/06/2014   Procedure: GASTROSTOMY;  Surgeon: [name], MD;  Location: [facility];  Service: General;  Laterality: N/A;  . HIATAL HERNIA REPAIR N/A 03/06/2014   Procedure: HERNIA REPAIR HIATAL;  Surgeon: [name], MD;  Location: [facility];  Service: General;  Laterality: N/A;  . LAPAROTOMY N/A 03/06/2014   Procedure: EXPLORATORY LAPAROTOMY;  Surgeon: [name], MD;  Location: [facility];  Service: General;  Laterality: N/A;  . TRANSURETHRAL RESECTION OF PROSTATE N/A 08/15/2014   Procedure: TRANSURETHRAL RESECTION OF THE PROSTATE WITH GYRUS INSTRUMENTS;  Surgeon: Malka So, MD;  Location: [facility];  Service: Urology;  Laterality: N/A;  . UMBILICAL HERNIA REPAIR      Current Outpatient Medications on File Prior to Visit  Medication Sig Dispense Refill  . aspirin EC 81 MG tablet Take 81 mg by mouth  daily. Swallow whole.    Marland Kitchen atorvastatin (LIPITOR) 10 MG tablet Take 10 mg by mouth daily.    . cholecalciferol (VITAMIN D) 1000 UNITS tablet Take 1,000 Units by mouth every morning.     . famotidine (PEPCID) 20 MG tablet TAKE 1 TABLET BY MOUTH EVERY DAY 30 tablet 0  . ferrous sulfate 325 (65 FE) MG tablet Take 1 tablet by mouth every other day.    . isosorbide mononitrate (IMDUR) 60 MG 24 hr tablet Take 60 mg by mouth daily.    . Multiple Vitamin (MULTIVITAMIN WITH MINERALS) TABS tablet Take 1 tablet by mouth daily. 30 tablet 0  . saccharomyces boulardii (FLORASTOR) 250 MG capsule Take 1 capsule (  250 mg total) by mouth 2 (two) times daily. 60 capsule 2  . vancomycin (VANCOCIN) 125 MG capsule Take 1 capsule (125 mg total) by mouth in the morning, at noon, and at bedtime for 14 days, THEN 1 capsule (125 mg total) 2 (two) times daily for 7 days, THEN 1 capsule (125 mg total) daily for 7 days. 63 capsule 0   No current facility-administered medications on file prior to visit.   No Known Allergies  Social History   Socioeconomic History  . Marital status: Married    Spouse name: Not on file  . Number of children: 2  . Years of education: Not on file  . Highest education level: Not on file  Occupational History  . Not on file  Tobacco Use  . Smoking status: Never Smoker  . Smokeless tobacco: Never Used  Vaping Use  . Vaping Use: Never used  Substance and Sexual Activity  . Alcohol use: Yes    Comment: ocassionally  . Drug use: No  . Sexual activity: Never  Other Topics Concern  . Not on file  Social History Narrative  . Not on file   Social Determinants of Health   Financial Resource Strain:   . Difficulty of Paying Living Expenses: Not on file  Food Insecurity:   . Worried About Charity fundraiser in the Last Year: Not on file  . Ran Out of Food in the Last Year: Not on file  Transportation Needs:   . Lack of Transportation (Medical): Not on file  . Lack of Transportation  (Non-Medical): Not on file  Physical Activity:   . Days of Exercise per Week: Not on file  . Minutes of Exercise per Session: Not on file  Stress:   . Feeling of Stress : Not on file  Social Connections:   . Frequency of Communication with Friends and Family: Not on file  . Frequency of Social Gatherings with Friends and Family: Not on file  . Attends Religious Services: Not on file  . Active Member of Clubs or Organizations: Not on file  . Attends Archivist Meetings: Not on file  . Marital Status: Not on file  Intimate Partner Violence:   . Fear of Current or Ex-Partner: Not on file  . Emotionally Abused: Not on file  . Physically Abused: Not on file  . Sexually Abused: Not on file     Vitals   Examination  General - not in acute distress, comfortably sitting in chair HEENT - PEERLA, no pallor and no icterus Chest - b/l clear air entry, no additional sounds CVS- Normal s1s2, RRR Abdomen - Soft, Non tender , non distended Ext- no pedal edema Neuro: grossly normal Back - WNL Psych : calm and cooperative   Recent labs CBC Latest Ref Rng & Units 08/21/2020 08/01/2020 06/29/2020  WBC 4.0 - 10.5 K/uL 9.2 15.2(H) 11.4(H)  Hemoglobin 13.0 - 17.0 g/dL 11.5(L) 10.2(L) 12.5(L)  Hematocrit 39 - 52 % 37.3(L) 33.0(L) 41.1  Platelets 150 - 400 K/uL 249 282 323   CMP Latest Ref Rng & Units 08/21/2020 08/01/2020 07/04/2020  Glucose 70 - 99 mg/dL 101(H) 102(H) 109(H)  BUN 8 - 23 mg/dL 12 11 6(L)  Creatinine 0.61 - 1.24 mg/dL 0.94 1.04 0.95  Sodium 135 - 145 mmol/L 139 135 135  Potassium 3.5 - 5.1 mmol/L 3.9 3.5 3.8  Chloride 98 - 111 mmol/L 103 100 98  CO2 22 - 32 mmol/L 27 27 30   Calcium 8.9 -  10.3 mg/dL 9.2 8.7(L) 7.9(L)  Total Protein 6.5 - 8.1 g/dL 6.3(L) 6.1(L) -  Total Bilirubin 0.3 - 1.2 mg/dL 2.0(H) 1.4(H) -  Alkaline Phos 38 - 126 U/L 61 65 -  AST 15 - 41 U/L 23 20 -  ALT 0 - 44 U/L 14 13 -     Pertinent Microbiology Results for orders placed or performed  during the hospital encounter of 08/21/20  Resp Panel by RT PCR (RSV, Flu A&B, Covid) - Nasopharyngeal Swab     Status: None   Collection Time: 08/21/20 11:20 AM   Specimen: Nasopharyngeal Swab  Result Value Ref Range Status   SARS Coronavirus 2 by RT PCR NEGATIVE NEGATIVE Final    Comment: (NOTE) SARS-CoV-2 target nucleic acids are NOT DETECTED.  The SARS-CoV-2 RNA is generally detectable in upper respiratoy specimens during the acute phase of infection. The lowest concentration of SARS-CoV-2 viral copies this assay can detect is 131 copies/mL. A negative result does not preclude SARS-Cov-2 infection and should not be used as the sole basis for treatment or other patient management decisions. A negative result may occur with  improper specimen collection/handling, submission of specimen other than nasopharyngeal swab, presence of viral mutation(s) within the areas targeted by this assay, and inadequate number of viral copies (<131 copies/mL). A negative result must be combined with clinical observations, patient history, and epidemiological information. The expected result is Negative.  Fact Sheet for Patients:  PinkCheek.be  Fact Sheet for Healthcare Providers:  GravelBags.it  This test is no t yet approved or cleared by the [place] FDA and  has been authorized for detection and/or diagnosis of SARS-CoV-2 by FDA under an Emergency Use Authorization (EUA). This EUA will remain  in effect (meaning this test can be used) for the duration of the COVID-19 declaration under Section 564(b)(1) of the Act, 21 U.S.C. section 360bbb-3(b)(1), unless the authorization is terminated or revoked sooner.     Influenza A by PCR NEGATIVE NEGATIVE Final   Influenza B by PCR NEGATIVE NEGATIVE Final    Comment: (NOTE) The Xpert Xpress SARS-CoV-2/FLU/RSV assay is intended as an aid in  the diagnosis of influenza from Nasopharyngeal  swab specimens and  should not be used as a sole basis for treatment. Nasal washings and  aspirates are unacceptable for Xpert Xpress SARS-CoV-2/FLU/RSV  testing.  Fact Sheet for Patients: PinkCheek.be  Fact Sheet for Healthcare Providers: GravelBags.it  This test is not yet approved or cleared by the [place] FDA and  has been authorized for detection and/or diagnosis of SARS-CoV-2 by  FDA under an Emergency Use Authorization (EUA). This EUA will remain  in effect (meaning this test can be used) for the duration of the  Covid-19 declaration under Section 564(b)(1) of the Act, 21  U.S.C. section 360bbb-3(b)(1), unless the authorization is  terminated or revoked.    Respiratory Syncytial Virus by PCR NEGATIVE NEGATIVE Final    Comment: (NOTE) Fact Sheet for Patients: PinkCheek.be  Fact Sheet for Healthcare Providers: GravelBags.it  This test is not yet approved or cleared by the [place] FDA and  has been authorized for detection and/or diagnosis of SARS-CoV-2 by  FDA under an Emergency Use Authorization (EUA). This EUA will remain  in effect (meaning this test can be used) for the duration of the  COVID-19 declaration under Section 564(b)(1) of the Act, 21 U.S.C.  section 360bbb-3(b)(1), unless the authorization is terminated or  revoked. Performed at [facility], [place],  [place] [address]   C Difficile Quick Screen w PCR reflex     Status: Abnormal   Collection Time: 08/21/20 12:44 PM   Specimen: Nasopharyngeal Swab; Stool  Result Value Ref Range Status   C Diff antigen POSITIVE (A) NEGATIVE Final   C Diff toxin POSITIVE (A) NEGATIVE Final   C Diff interpretation Toxin producing C. difficile detected.  Final    Comment: CRITICAL RESULT CALLED TO, READ BACK BY AND VERIFIED WITH: M. Withrow CMA 15:05 08/21/20  ([email or web address]) Performed at [facility], [place] [address] [place], [place] [address]      Pertinent Imaging  Chest Xray 08/21/20  FINDINGS: Stable cardiomediastinal silhouette. No pneumothorax or pleural effusion is noted. Stable bibasilar subsegmental atelectasis or scarring is noted. Bony thorax is unremarkable.  IMPRESSION: Stable bibasilar subsegmental atelectasis or scarring.  CT abdomen/pelvis 06/24/2020  FINDINGS: Lower chest: There is underlying fibrosis bilaterally. There is a small area of apparent loculated effusion in the right base region. There are multiple foci of coronary artery calcification. There is thickening of the distal esophageal wall.  Hepatobiliary: There is a cyst in the anterior segment of the right lobe of the liver measuring 0.7 x 0.6 cm. No other focal liver lesions are evident. The gallbladder is absent. There is intrahepatic biliary duct dilatation. The common hepatic duct is enlarged, measuring 13 mm. The common bile duct tapers distally. No obstructing lesion is seen in the biliary ductal system by CT.  Pancreas: There is no pancreatic mass or inflammatory focus.  Spleen: No splenic lesions are appreciable.  Adrenals/Urinary Tract: Adrenals bilaterally appear normal. There is a cyst arising from the posterior upper pole of the left kidney measuring 1.2 x 1.2 cm. There is no appreciable hydronephrosis on either side. There is a 3 x 2 mm calculus in the lower pole of the left kidney. There is a 1 mm calculus in the mid right kidney. There is a 2 mm calculus in the upper pole of the right kidney. There is a 2 x 2 mm calculus in the upper pole of the left kidney. There is no appreciable ureteral calculus on either side. Urinary bladder is midline with wall thickness within normal limits.  Stomach/Bowel: There is diffuse wall thickening throughout the entire:. There is patchy soft tissue stranding surrounding multiple loops of colon.  No appreciable diverticular disease is evident. There is no colonic pneumatosis. There is no appreciable small bowel wall thickening or small bowel dilatation. No focal bowel obstruction is demonstrable on this study. The terminal ileum appears unremarkable. There is no appreciable free air or portal venous air. Slight fluid is seen surrounding several loops of bowel with fluid noted in the right and left lateral conal fascia regions. There is no free air or portal venous air. There is an uncomplicated diverticulum arising in the third portion of the duodenum measuring 1.3 x 1.0 cm. Note that there is a degree of colonic interposition between the liver and right hemidiaphragm. Bowel in this area of colonic interposition is diffusely inflamed.  Vascular/Lymphatic: There is no abdominal aortic aneurysm. There are foci of aortic atherosclerosis in the aorta and iliac arteries. No appreciable mesenteric arterial vascular narrowing evident. Major venous structures are patent. There is no appreciable adenopathy in the abdomen or pelvis.  Reproductive: Prostate and seminal vesicles are normal in size and contour. There are occasional prostatic calculi.  Other: Appendix appears normal. No evident abscess in the abdomen pelvis. Minimal ascites noted.  There is evidence of  midline abdominal wall soft tissue thickening, likely of postoperative etiology. There is rectus muscle thinning in this area. No bowel compromise seen in this area. There is evidence of fluid tracking into a small inguinal hernia on the right. No bowel extending into this area.  Musculoskeletal: No blastic or lytic bone lesions. No intramuscular lesions are evident.  IMPRESSION: 1. Diffuse colonic wall thickening throughout essentially the entire colon consistent with diffuse colitis. There is surrounding mesenteric thickening in areas of mild fluid tracking along the colon. No evident perforation. No appreciable  diverticular disease. No pneumatosis. Etiology for the colitis uncertain. Infectious colitis could certainly present in this manner. No appreciable major mesenteric arterial vessel narrowing to suggest ischemic etiology.  2. Essentially normal appearing small bowel. Terminal ileum appears normal. No bowel obstruction.  3.  Appendix appears normal.  No abscess in the abdomen or pelvis.  4. Nonobstructing small calculi in each kidney. No hydronephrosis or ureteral calculus on either side. Urinary bladder wall thickness normal.  5. Gallbladder absent. Intrahepatic and common hepatic bile duct dilatation noted. No obstructing focus in the biliary ductal system evident by CT.  6. Aortic Atherosclerosis (ICD10-I70.0). Foci of pelvic arterial vascular calcification as well as foci of coronary artery calcification noted.  7. Thickening of the distal esophageal wall. Question a degree of distal esophagitis.  8. Small amount ascites. A small amount of fluid tracks into a right inguinal hernia.  All pertinent labs/Imagings/notes reviewed. All pertinent plain films and CT images have been personally visualized and interpreted; radiology reports have been reviewed. Decision making incorporated into the Impression / Recommendations.  I spent greater than 25 minutes with the patient including  review of prior medical records with greater than 50% of time in face to face counsel of the patient.    Electronically signed by:  Rosiland Oz, MD Infectious Disease Physician [facility] [address] [place], [place] [address] Phone: [phone]  Fax: [phone]

## 2020-09-16 NOTE — Patient Instructions (Addendum)
Vancomycin 125,mg PO three times a day from 11/26 for 14 days. End date 12/10 Vancomycin 125mg  PO BID for 7 days from 12/11. End date 09/26/20 Vancomycin 125mg  po daily for 7 days from 12/18. End date 12/24 Vancomycin 125mg  PO every alternate days for 2-8 weeks  Follow up in 2 weeks

## 2020-09-16 NOTE — Telephone Encounter (Signed)
Spoke to patient to verify pharmacy, [facility] confirms [facility] [place], [place]. Let patient know Dr. [place] B[place] has [facility]. Patient verbalized understanding and has no further questions.    Beryle Flock, RN

## 2020-09-16 NOTE — Addendum Note (Signed)
Addended by: Rosiland Oz on: 09/16/2020 10:25 AM   Modules accepted: Orders

## 2020-09-16 NOTE — Assessment & Plan Note (Signed)
Declined Flu and COVID vaccines

## 2020-09-16 NOTE — Telephone Encounter (Signed)
-----   Message from Rosiland Oz, MD sent at 09/16/2020  9:19 AM EST ----- Regarding: PO Vancomycin Could you please tell patient I have sent PO Vancomycin to his listed pharamcy in Epic and make sure its correct? He can pick it up in case he does not have enough meds at home.

## 2020-09-18 ENCOUNTER — Other Ambulatory Visit: Payer: Self-pay

## 2020-09-18 ENCOUNTER — Ambulatory Visit: Payer: Medicare Other | Admitting: Internal Medicine

## 2020-09-18 ENCOUNTER — Encounter: Payer: Self-pay | Admitting: Internal Medicine

## 2020-09-18 DIAGNOSIS — J8489 Other specified interstitial pulmonary diseases: Secondary | ICD-10-CM

## 2020-09-18 NOTE — Progress Notes (Signed)
Subjective:     Patient ID: Troy Sanchez, male   DOB: Jun 25, 1941,    MRN: [id number]    Brief patient profile: 9 yowm never smoker with onset of doe around late spring  2017  > referred to [facility] who dx [place] by [place] 05/2016 and concerned about crackles on exam 10/31/17 so referred to pulmonary clinic 11/04/2017 by Dr   Nadyne Coombes     History of Present Illness  11/04/2017 1st [facility] office visit/ Fani Rotondo   Chief Complaint  Patient presents with  . Pulmonary Consult    Referred by Dr. Einar Gip for eval of pulmonary hypertension.  He states he has had DOE for the past 2 yrs. He states he only gets winded when he walks up an incline.    indolent onset progressive doe = MMRC2 = can't walk a nl pace on a flat grade s sob but does fine slow and flat  Really minimal progression since onset and on no meds for PAH nor resp rx and no assoc  Cough  rec No change rx       01/24/2018  f/u ov/Jonthan Leite re: doe since around 2017 only hills/fast pace Chief Complaint  Patient presents with  . Follow-up    follow up after CT scan. Patient had CT scan on 01/20/18. Denies any current issues.  Dyspnea: still MMRC1 = can walk nl pace, flat grade, can't hurry or go uphills or steps s sob   Cough: none Sleep: ok rec No change/ monitor sats    07/31/2018  f/u ov/Tymeshia Awan re:  Doe is getting slt worse for same activty onset around 2017 with ? NSIP / not monitoring sats as rec  Chief Complaint  Patient presents with  . Follow-up    dyspnea on exertion. worsened indigestion   Dyspnea:  Min decreased ex tol Cough: at times assoc with overt sense of mild gerd , no longer on fish oil> gi w/u in progress but not on rx yet  Sleeping: on side maybe 10-20 degrees rec I will defer to GI how to treat your reflux  Monitor your 02 levels with exercise - presently staying in mid 90's  GERD diet  Please schedule a follow up visit in 6  months but call sooner if needed with pfts on return        11/12/2019  f/u ov/Ashiah Karpowicz  re:  PF no chage ex tol Chief Complaint  Patient presents with  . Follow-up    Breathing is doing well and no new co's today.   Dyspnea:  Walking neighborhood 15-20 min and stops on steep hills, does not check sats as rec  Cough: none Sleeping: 10 degrees s resp symptoms  SABA use: none 02: none   rec Make sure you check your oxygen saturations at highest level of activity to be sure it stays over 90% and call me if trending downward    02/07/20  Dx of covid [facility]   - says took vaccine x one dose after initial infection s side effedts and? 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 Follow-up    Breathing is unchanged since the last visit. No new co's today.   Dyspnea:  Walking dogs in [place] area with hills s stopping twice daily  Cough: none Sleeping: hosp bed  is at 10 degrees s resp c/o's SABA use: none  02: none/ sats are running no lower than 93-94%  Apparently took on vaccine ? When   No obvious day to day or daytime variability or assoc excess/ purulent sputum or mucus plugs or hemoptysis or cp or chest tightness, subjective wheeze or overt sinus or hb symptoms.   Sleeping as abive  without nocturnal  or early am exacerbation  of respiratory  c/o's or need for noct saba. Also denies any obvious fluctuation of symptoms with weather or environmental changes or other aggravating or alleviating factors except as outlined above   No unusual exposure hx or h/o childhood pna/ asthma or knowledge of premature birth.  Current Allergies, Complete Past Medical History, Past Surgical History, Family History, and Social History were reviewed in [employer] record.  ROS  The following are not active complaints unless bolded Hoarseness, sore throat, dysphagia, dental problems, itching, sneezing,   nasal congestion or discharge of excess mucus or purulent secretions, ear ache,   fever, chills, sweats, unintended wt loss or wt gain, classically pleuritic or exertional cp,  orthopnea pnd or arm/hand swelling  or leg swelling, presyncope, palpitations, abdominal pain, anorexia, nausea, vomiting, diarrhea  or change in bowel habits or change in bladder habits, change in stools or change in urine, dysuria, hematuria,  rash, arthralgias, visual complaints, headache, numbness, weakness or ataxia or problems with walking or coordination,  change in mood or  memory.        Current Meds  Medication Sig  . aspirin EC 81 MG tablet Take 81 mg by mouth daily. Swallow whole.  Marland Kitchen atorvastatin (LIPITOR) 10 MG tablet Take 10 mg by mouth daily.  . cholecalciferol (VITAMIN D) 1000 UNITS tablet Take 1,000 Units by mouth every morning.   . famotidine (PEPCID) 20 MG tablet TAKE 1 TABLET BY MOUTH EVERY DAY  . ferrous sulfate 325 (65 FE) MG tablet Take 1 tablet by mouth every other day.  . isosorbide mononitrate (IMDUR) 60 MG 24 hr tablet Take 60 mg by mouth daily.  . Multiple Vitamin (MULTIVITAMIN WITH MINERALS) TABS tablet Take 1 tablet by mouth daily.  Marland Kitchen saccharomyces boulardii (FLORASTOR) 250 MG capsule Take 1 capsule (250 mg total) by mouth 2 (two) times daily.  . vancomycin (VANCOCIN) 125 MG capsule Take 1 capsule (125 mg total) by mouth in the morning, at noon, and at bedtime for 14 days, THEN 1 capsule (125 mg total) 2 (two) times daily for 7 days, THEN 1 capsule (125 mg total) daily for 7 days.  Derrill Memo ON 09/29/2020] vancomycin (VANCOCIN) 125 MG capsule Take 1 capsule (125 mg total) by mouth daily.                    Objective:   Physical Exam   09/18/2020         177 11/12/2019          217  05/08/2019        217  07/31/2018      219  01/24/2018        212   12/09/2017        209   11/04/17 206 lb (93.4 kg)  05/18/16 215 lb (97.5 kg)  08/15/14 203 lb 14.8 oz (92.5 kg)    Vital signs reviewed   09/18/2020  - Note at rest 02 sats  100% on RA            Chest with  mild pectus deformity with insp crackles in bases bilaterally     HEENT : pt wearing mask not removed for exam due to covid -19 concerns.    NECK :  without JVD/Nodes/TM/ nl carotid upstrokes bilaterally   LUNGS: no acc muscle use,  Mild pectus deformity with crackles on insp R > L bases   without cough on insp or exp maneuvers   CV:  RRR  no s3 or murmur or increase in P2, and no edema   ABD:  soft and nontender with nl inspiratory excursion in the supine position. No bruits or organomegaly appreciated, bowel sounds nl  MS:  Nl gait/ ext warm without deformities, calf tenderness, cyanosis or clubbing No obvious joint restrictions   SKIN: warm and dry without lesions    NEURO:  alert, approp, nl sensorium with  no motor or cerebellar deficits apparent.       I personally reviewed images and agree with radiology impression as follows:  CXR:   08/21/20  Stable bibasilar subsegmental atelectasis or scarring.     Assessment:

## 2020-09-18 NOTE — Assessment & Plan Note (Addendum)
Onset of symptoms  spring 2017 [place] 05/18/16 with PA mean  17 and wedge 5 and CO  = 5.65  So PVR = 12/5.65 x 80 = 170 (wnl) Echo 10/31/17 :  Low nl with borderline global hypokinesis ef 50-55% and Mild LAE/ RVE / trace MR  - no change vs 04/28/16  - 11/04/2017  Walked RA x 3 laps @ 185 ft each stopped due to  End of study, fast pace, no sob or desat    - PFT's  12/09/2017   FVC 3.82 (90%)  No obst    p nothing prior to study with DLCO ( 19.63) 60 % corrects to (4.04) 87  % for alv volume   - 12/09/2017  Walked RA x 3 laps @ 185 ft each stopped due to  End of study, fast pace, no sob or desat    - ONO RA 12/12/17 desats < 89% x 26 min  - in absence of PH /symptoms do not rec rx  - HRCT 01/20/2018 1. Appearance of the lungs is compatible with interstitial lung disease. CT pattern is considered indeterminate for usual interstitial pneumonia (UIP). 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 - 01/24/2018  Walked RA x 3 laps @ 185 ft each stopped due to  End of study,fast  pace, no sob or desat    - 6 mw 07/31/2018   378 mild sob no desats - 05/08/2019   Walked RA  2 laps @  approx 231f each @ avg pace  stopped due to  End of study min sob, no cp, sats 94% - PFT's  05/08/2019  FVC  3.74 (89%) prior to study with DLCO  16.92 (67%) corrects to 3.37 (85%)  for alv volume and FV curve s convexity    - Labs 05/08/19  :   Neg collagen vasc profile with esr 7,  HSP serology neg  - HRCT 06/01/19 :  Findings are not significantly changed compared to prior examinations dating back to 04/21/2016 remain non diagnostic  - 11/12/2019   Walked RA x two laps =  approx 5070f@ moderate pace - stopped due to end of study, no sob with sats of 96 % at the end of the study.  No evidence of dz progression or underlying collagen vasc dz or HSP so likley benign form of PF and all that's required is monitoring serial  sats at highest level of exertion   >> f/u yearly   Advised: Patient is unvaccinated and was informed of the seriousness of COVID 19 infection as a direct risk to lung health as well as safety to close contacts and should continue to wear a facemask in public and minimize exposure to public locations but especially avoid any area or activity where non-close contacts are not observing distancing or wearing an appropriate face mask.  I strongly recommended pt take either of the vaccines available through local drugstores based on updated information on millions of Americans treated with the [employer]ducts  which have proven both safe and  effective even against the new delta variant.    >>> rec he discuss this again with his ID doctor          Each maintenance medication was reviewed in detail including emphasizing most importantly the difference between maintenance and prns and under what circumstances the prns are to be triggered using an action  plan format where appropriate.  Total time for H and P, chart review, counseling, teaching device and generating customized AVS unique to this office visit / charting = > 30 min

## 2020-09-18 NOTE — Patient Instructions (Addendum)
Ask the infection doctor about your concerns re taking additional vaccines for covid but I strongly recommend you reconsider completing the regimen   Please schedule a follow up visit in 63months but call sooner if needed

## 2020-09-23 DIAGNOSIS — R200 Anesthesia of skin: Secondary | ICD-10-CM | POA: Diagnosis not present

## 2020-09-23 DIAGNOSIS — M5000 Cervical disc disorder with myelopathy, unspecified cervical region: Secondary | ICD-10-CM | POA: Diagnosis not present

## 2020-09-23 DIAGNOSIS — G5732 Lesion of lateral popliteal nerve, left lower limb: Secondary | ICD-10-CM | POA: Diagnosis not present

## 2020-09-23 DIAGNOSIS — G629 Polyneuropathy, unspecified: Secondary | ICD-10-CM | POA: Diagnosis not present

## 2020-09-23 DIAGNOSIS — M21372 Foot drop, left foot: Secondary | ICD-10-CM | POA: Diagnosis not present

## 2020-10-07 ENCOUNTER — Ambulatory Visit (HOSPITAL_COMMUNITY)
Admission: EM | Admit: 2020-10-07 | Discharge: 2020-10-07 | Disposition: A | Discharge status: Home / Self Care | Payer: Medicare Other | Attending: Family Medicine | Admitting: Family Medicine

## 2020-10-07 ENCOUNTER — Other Ambulatory Visit: Payer: Self-pay

## 2020-10-07 ENCOUNTER — Encounter (HOSPITAL_COMMUNITY): Payer: Self-pay

## 2020-10-07 DIAGNOSIS — R509 Fever, unspecified: Secondary | ICD-10-CM | POA: Diagnosis not present

## 2020-10-07 DIAGNOSIS — Z20822 Contact with and (suspected) exposure to covid-19: Secondary | ICD-10-CM | POA: Diagnosis not present

## 2020-10-07 DIAGNOSIS — J101 Influenza due to other identified influenza virus with other respiratory manifestations: Secondary | ICD-10-CM | POA: Insufficient documentation

## 2020-10-07 DIAGNOSIS — J069 Acute upper respiratory infection, unspecified: Secondary | ICD-10-CM | POA: Diagnosis not present

## 2020-10-07 MED ORDER — BENZONATATE 100 MG PO CAPS: ORAL_CAPSULE | ORAL | 0 refills | Status: DC

## 2020-10-07 NOTE — ED Triage Notes (Signed)
Pt c/o fever that started yesterday afternoon. Pt states the fever went down last night but this morning it went up. Pt states he has been coughing and feels pain when coughing.

## 2020-10-07 NOTE — Discharge Instructions (Addendum)
You have been tested for COVID-19 and influenza today. If your test returns positive, you will receive a phone call from  regarding your results. Negative test results are not called. Both positive and negative results area always visible on MyChart. If you do not have a MyChart account, sign up instructions are provided in your discharge papers. Please do not hesitate to contact us should you have questions or concerns.  

## 2020-10-08 ENCOUNTER — Ambulatory Visit: Payer: Medicare Other | Admitting: Physician Assistant

## 2020-10-08 LAB — RESP PANEL BY RT-PCR (FLU A&B, COVID) ARPGX2
Influenza A by PCR: POSITIVE — AB
Influenza B by PCR: NEGATIVE
SARS Coronavirus 2 by RT PCR: NEGATIVE

## 2020-10-08 NOTE — ED Provider Notes (Signed)
Troy Sanchez   PO:[id number] 10/07/20 Arrival Time: [id number]  ASSESSMENT & PLAN:  1. Viral URI with cough   2. Fever, unspecified fever cause     COVID-19 and influenza testing sent. OTC symptom care as needed.  If needed: Meds ordered this encounter  Medications  . benzonatate (TESSALON) 100 MG capsule    Sig: Take 1 capsule by mouth every 8 (eight) hours for cough.    Dispense:  21 capsule    Refill:  0     Follow-up Information    Deland Pretty, MD.   Specialty: Internal Medicine Why: As needed. Contact information: [address] [place] [address] [phone]               Reviewed expectations re: course of current medical issues. Questions answered. Outlined signs and symptoms indicating need for more acute intervention. Understanding verbalized. After Visit Summary given.   SUBJECTIVE: History from: patient. Troy Sanchez is a 79 y.o. male who presents with worries regarding COVID-19. Known COVID-19 contact: none. Recent travel: none. Reports: fever and cough beginning yesterday. Denies: difficulty breathing. Normal PO intake without n/v/d.  Immunization History  Administered Date(s) Administered  . Pneumococcal Conjugate-13 03/26/2015  . Tdap 12/11/2018     OBJECTIVE:  Vitals:   10/07/20 1859  BP: 124/74  Pulse: 94  Resp: 20  Temp: (!) 101.5 F (38.6 C)  TempSrc: Oral  SpO2: 99%    Temp noted General appearance: alert; no distress Eyes: PERRLA; EOMI; conjunctiva normal HENT: [place]; AT; with nasal congestion Neck: supple  Lungs: speaks full sentences without difficulty; unlabored; dry cough; CTAB Extremities: no edema Skin: warm and dry Neurologic: normal gait Psychological: alert and cooperative; normal mood and affect    No Known Allergies  Past Medical History:  Diagnosis Date  . C. difficile diarrhea   . CAD (coronary artery disease) 07/02/2019  . Hiatal hernia   . HTN (hypertension) 07/02/2019   . Hypertension    Social History   Socioeconomic History  . Marital status: Married    Spouse name: Not on file  . Number of children: 2  . Years of education: Not on file  . Highest education level: Not on file  Occupational History  . Not on file  Tobacco Use  . Smoking status: Never Smoker  . Smokeless tobacco: Never Used  Vaping Use  . Vaping Use: Never used  Substance and Sexual Activity  . Alcohol use: Yes    Comment: ocassionally  . Drug use: No  . Sexual activity: Never  Other Topics Concern  . Not on file  Social History Narrative  . Not on file   Social Determinants of Health   Financial Resource Strain: Not on file  Food Insecurity: Not on file  Transportation Needs: Not on file  Physical Activity: Not on file  Stress: Not on file  Social Connections: Not on file  Intimate Partner Violence: Not on file   History reviewed. No pertinent family history. Past Surgical History:  Procedure Laterality Date  . CARDIAC CATHETERIZATION N/A 05/18/2016   Procedure: Right/Left Heart Cath and Coronary Angiography;  Surgeon: Adrian Prows, MD;  Location: [facility];  Service: Cardiovascular;  Laterality: N/A;  . CHOLECYSTECTOMY  2006  . GASTROSTOMY N/A 03/06/2014   Procedure: GASTROSTOMY;  Surgeon: [name], MD;  Location: [facility];  Service: General;  Laterality: N/A;  . HIATAL HERNIA REPAIR N/A 03/06/2014   Procedure: HERNIA REPAIR HIATAL;  Surgeon: Jeneen Rinks  Charlsie Quest, MD;  Location: [facility];  Service: General;  Laterality: N/A;  . LAPAROTOMY N/A 03/06/2014   Procedure: EXPLORATORY LAPAROTOMY;  Surgeon: Cherylynn Ridges, MD;  Location: [facility];  Service: General;  Laterality: N/A;  . TRANSURETHRAL RESECTION OF PROSTATE N/A 08/15/2014   Procedure: TRANSURETHRAL RESECTION OF THE PROSTATE WITH GYRUS INSTRUMENTS;  Surgeon: Anner Crete, MD;  Location: [facility];  Service: Urology;  Laterality: N/A;  . UMBILICAL HERNIA REPAIR       Mardella Layman, MD 10/08/20 [phone]

## 2020-10-16 ENCOUNTER — Encounter: Payer: Self-pay | Admitting: Infectious Diseases

## 2020-10-16 ENCOUNTER — Other Ambulatory Visit: Payer: Self-pay

## 2020-10-16 ENCOUNTER — Ambulatory Visit: Payer: Medicare Other | Admitting: Infectious Diseases

## 2020-10-16 ENCOUNTER — Telehealth: Payer: Self-pay

## 2020-10-16 VITALS — BP 118/42 | HR 59 | Temp 98.1°F | Wt 176.0 lb

## 2020-10-16 DIAGNOSIS — A0471 Enterocolitis due to Clostridium difficile, recurrent: Secondary | ICD-10-CM | POA: Diagnosis not present

## 2020-10-16 NOTE — Telephone Encounter (Signed)
[facility] Patient Advocate Encounter   Received notification from [facility] that prior authorization for [facility] is required.   PA submitted on 10/16/20 Key [id number] Status is pending    [facility] Clinic will continue to follow.   Clearance Coots, CPhT Specialty Pharmacy Patient [facility] Phone: [phone] Fax:  [phone]

## 2020-10-16 NOTE — Patient Instructions (Signed)
Bezlotoxumab: Patient drug information   Access [employer] for additional drug information, tools, and databases. Copyright [phone] [employer] All rights reserved. (For additional information see "Bezlotoxumab: Drug information")  You must carefully read the "Consumer Information Use and Disclaimer" below in order to understand and correctly use this information.  Brand Names: [place]  Zinplava  What is this drug used for?   It is used to lower the chance of a type of bacterial infection called C diff from coming back.  What do I need to tell my doctor BEFORE I take this drug?   If you are allergic to this drug; any part of this drug; or any other drugs, foods, or substances. Tell your doctor about the allergy and what signs you had.   This drug may interact with other drugs or health problems.   Tell your doctor and pharmacist about all of your drugs (prescription or OTC, natural products, vitamins) and health problems. You must check to make sure that it is safe for you to take this drug with all of your drugs and health problems. Do not start, stop, or change the dose of any drug without checking with your doctor.  What are some things I need to know or do while I take this drug?   Tell all of your health care providers that you take this drug. This includes your doctors, nurses, pharmacists, and dentists.   This drug will not treat a C diff infection. Keep taking your other antibiotic treatment for the C diff infection as you have been told by your doctor.   Tell your doctor if you are pregnant, plan on getting pregnant, or are breast-feeding. You will need to talk about the benefits and risks to you and the baby.  What are some side effects that I need to call my doctor about right away?   WARNING/CAUTION: Even though it may be rare, some people may have very bad and sometimes deadly side effects when taking a drug. Tell your doctor or get medical help right away if you have any  of the following signs or symptoms that may be related to a very bad side effect:   Signs of an allergic reaction, like rash; hives; itching; red, swollen, blistered, or peeling skin with or without fever; wheezing; tightness in the chest or throat; trouble breathing, swallowing, or talking; unusual hoarseness; or swelling of the mouth, face, lips, tongue, or throat.   Signs of high blood pressure like very bad headache or dizziness, passing out, or change in eyesight.   Serious heart failure has happened with this drug. People with a history of heart failure who took this drug had a higher chance of heart failure and death than those who did not take this drug. Tell your doctor if you have ever had heart failure. Call your doctor right away if you have shortness of breath, a big weight gain, or swelling in the arms or legs that is new or worse.  What are some other side effects of this drug?   All drugs may cause side effects. However, many people have no side effects or only have minor side effects. Call your doctor or get medical help if any of these side effects or any other side effects bother you or do not go away:   Upset stomach.   Fever.   Headache.   Feeling tired or weak.   Dizziness.   These are not all of the side effects that may occur.  If you have questions about side effects, call your doctor. Call your doctor for medical advice about side effects.   You may report side effects to your national health agency.  How is this drug best taken?   Use this drug as ordered by your doctor. Read all information given to you. Follow all instructions closely.   It is given as an infusion into a vein over a period of time.  What do I do if I miss a dose?   Call your doctor to find out what to do.  How do I store and/or throw out this drug?   If you need to store this drug at home, talk with your doctor, nurse, or pharmacist about how to store it.  General drug facts   If your symptoms or  health problems do not get better or if they become worse, call your doctor.   Do not share your drugs with others and do not take anyone else's drugs.   Keep all drugs in a safe place. Keep all drugs out of the reach of children and pets.   Throw away unused or expired drugs. Do not flush down a toilet or pour down a drain unless you are told to do so. Check with your pharmacist if you have questions about the best way to throw out drugs. There may be drug take-back programs in your area.   Some drugs may have another patient information leaflet. If you have any questions about this drug, please talk with your doctor, nurse, pharmacist, or other health care provider.   If you think there has been an overdose, call your poison control center or get medical care right away. Be ready to tell or show what was taken, how much, and when it happened.

## 2020-10-16 NOTE — Progress Notes (Addendum)
[facility] Infectious [facility]                                                             [address], [place], [place], [address]                                                                  Phn. [phone]; Fax: [phone]                                                                             Date: 10/16/2020  Reason for Follow Up: Recurrent C diff Diarrhea   Assessment 1. 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 Diarrhea resolved. H/o use of antibiotics prior. 08/01/20 C diff ag and toxin positive. Treated with 2 weeks course of PO Vancomycin. Diarrhea resolved. Difficulty with Copay for Fidaxomicin 08/21/20 C diff ag and toxin positive- Started on PO Vancomycin for 2 weeks followed by tapering dose by [name]  2. Health Maintenance - Declined Flu and COVID vaccines    Plan Patient started taking PO Vancomycin 125 mg PO every alternate days since 12/28 and as not had diarrhea. Will continue same regimen for 4 more weeks. He says he has adequate refills of PO vancomycin. He will call clinic with any issues.  Discussed about IV bezlotuxumab infusion. I have printed drug information about it in his AVS today. He says he wants to think about it. I told him if that this infusion should be given while being treated for active C diff infection  Will ask pharmacy about insurance coverage and co-pays as patient would like to know it  Fu in 4 weeks   All questions and concerns were discussed and addressed. Patient verbalized understanding of the plan. ____________________________________________________________________________________________________________________ Subjective/Interval events 10/16/2020 Troy Sanchez is here for follow up of Recurrent C diff diarrhea. He says he has been taking PO Vancomycin as instructed  last time and is currently taking the PO vancomycin every alternate days since 12/28. Denies having diarrhea. Denies nausea/vomiting and abdominal pain. Denies any rashes, illness.   Discussed about bezlotuxumab infection for preventing C diff. He wants to read about it and decide it later. He also wants to know if insurance will cover its cost. Will check that with our pharmacy. I have also provided him general patient information about Bezlotuxumab in his AVS.   Of note, he was recently seen in the ED( 12/28) for URI symptoms and was tested positive for Influenza A.  ROS: Constitutional: Negative for fever, chills, activity change, appetite change, fatigue and unexpected weight change.  HENT: Negative for congestion, sore throat, rhinorrhea, sneezing, trouble swallowing and sinus pressure.  Eyes: Negative for photophobia  and visual disturbance.  Respiratory: Negative for cough, chest tightness, shortness of breath, wheezing and stridor.  Cardiovascular: Negative for chest pain, palpitations and leg swelling.  Gastrointestinal: Negative for nausea, vomiting, abdominal pain, diarrhea, constipation, blood in stool, abdominal distention and anal bleeding.  Genitourinary: Negative for dysuria, hematuria, flank pain and difficulty urinating.  Musculoskeletal: Negative for myalgias, back pain, joint swelling, arthralgias and gait problem.  Skin: Negative for color change, pallor, rash and wound.  Neurological: Negative for dizziness, tremors, weakness and light-headedness.  Hematological: Negative for adenopathy. Does not bruise/bleed easily.  Psychiatric/Behavioral: Negative for behavioral problems, confusion, sleep disturbance, dysphoric mood, decreased concentration and agitation.   Past Medical History:  Diagnosis Date  . C. difficile diarrhea   . CAD (coronary artery disease) 07/02/2019  . Hiatal hernia   . HTN (hypertension) 07/02/2019  . Hypertension    Past Surgical History:  Procedure  Laterality Date  . CARDIAC CATHETERIZATION N/A 05/18/2016   Procedure: Right/Left Heart Cath and Coronary Angiography;  Surgeon: Adrian Prows, MD;  Location: [facility];  Service: Cardiovascular;  Laterality: N/A;  . CHOLECYSTECTOMY  2006  . GASTROSTOMY N/A 03/06/2014   Procedure: GASTROSTOMY;  Surgeon: [name], MD;  Location: [facility];  Service: General;  Laterality: N/A;  . HIATAL HERNIA REPAIR N/A 03/06/2014   Procedure: HERNIA REPAIR HIATAL;  Surgeon: [name], MD;  Location: [facility];  Service: General;  Laterality: N/A;  . LAPAROTOMY N/A 03/06/2014   Procedure: EXPLORATORY LAPAROTOMY;  Surgeon: [name], MD;  Location: [facility];  Service: General;  Laterality: N/A;  . TRANSURETHRAL RESECTION OF PROSTATE N/A 08/15/2014   Procedure: TRANSURETHRAL RESECTION OF THE PROSTATE WITH GYRUS INSTRUMENTS;  Surgeon: Malka So, MD;  Location: [facility];  Service: Urology;  Laterality: N/A;  . UMBILICAL HERNIA REPAIR     Current Outpatient Medications on File Prior to Visit  Medication Sig Dispense Refill  . aspirin EC 81 MG tablet Take 81 mg by mouth daily. Swallow whole.    Marland Kitchen atorvastatin (LIPITOR) 10 MG tablet Take 10 mg by mouth daily.    . benzonatate (TESSALON) 100 MG capsule Take 1 capsule by mouth every 8 (eight) hours for cough. 21 capsule 0  . cholecalciferol (VITAMIN D) 1000 UNITS tablet Take 1,000 Units by mouth every morning.     . famotidine (PEPCID) 20 MG tablet TAKE 1 TABLET BY MOUTH EVERY DAY 90 tablet 1  . ferrous sulfate 325 (65 FE) MG tablet Take 1 tablet by mouth every other day.    . isosorbide mononitrate (IMDUR) 60 MG 24 hr tablet Take 60 mg by mouth daily.    . Multiple Vitamin (MULTIVITAMIN WITH MINERALS) TABS tablet Take 1 tablet by mouth daily. 30 tablet 0  . saccharomyces boulardii (FLORASTOR) 250 MG capsule Take 1 capsule (250 mg total) by mouth 2 (two) times daily. 60 capsule 2  . vancomycin (VANCOCIN) 125 MG capsule Take 1 capsule (125 mg total) by mouth daily. 4  capsule 0  . vancomycin (VANCOCIN) 125 MG capsule Take 1 capsule (125 mg total) by mouth every other day. (Patient not taking: Reported on 10/16/2020) 30 capsule 1   No current facility-administered medications on file prior to visit.   No Known Allergies  Social History   Socioeconomic History  . Marital status: Married    Spouse name: Not on file  . Number of children: 2  . Years of education: Not on file  . Highest education level: Not on  file  Occupational History  . Not on file  Tobacco Use  . Smoking status: Never Smoker  . Smokeless tobacco: Never Used  Vaping Use  . Vaping Use: Never used  Substance and Sexual Activity  . Alcohol use: Yes    Comment: ocassionally  . Drug use: No  . Sexual activity: Never  Other Topics Concern  . Not on file  Social History Narrative  . Not on file   Social Determinants of Health   Financial Resource Strain: Not on file  Food Insecurity: Not on file  Transportation Needs: Not on file  Physical Activity: Not on file  Stress: Not on file  Social Connections: Not on file  Intimate Partner Violence: Not on file    Vitals BP (!) 118/42   Pulse (!) 59   Temp 98.1 F (36.7 C) (Oral)   Wt 176 lb (79.8 kg)   BMI 25.25 kg/m '  Examination  General - not in acute distress, comfortably sitting in chair HEENT - PEERLA, no pallor and no icterus, HARD OF HEARING Chest - b/l clear air entry, no additional sounds CVS- Normal s1s2, RRR Abdomen - Soft, Non tender , non distended Ext- no pedal edema Neuro: grossly normal Back - WNL Psych : calm and cooperative   Recent labs CBC Latest Ref Rng & Units 08/21/2020 08/01/2020 06/29/2020  WBC 4.0 - 10.5 K/uL 9.2 15.2(H) 11.4(H)  Hemoglobin 13.0 - 17.0 g/dL 11.5(L) 10.2(L) 12.5(L)  Hematocrit 39.0 - 52.0 % 37.3(L) 33.0(L) 41.1  Platelets 150 - 400 K/uL 249 282 323   CMP Latest Ref Rng & Units 08/21/2020 08/01/2020 07/04/2020  Glucose 70 - 99 mg/dL 101(H) 102(H) 109(H)  BUN 8 - 23  mg/dL 12 11 6(L)  Creatinine 0.61 - 1.24 mg/dL 0.94 1.04 0.95  Sodium 135 - 145 mmol/L 139 135 135  Potassium 3.5 - 5.1 mmol/L 3.9 3.5 3.8  Chloride 98 - 111 mmol/L 103 100 98  CO2 22 - 32 mmol/L 27 27 30   Calcium 8.9 - 10.3 mg/dL 9.2 8.7(L) 7.9(L)  Total Protein 6.5 - 8.1 g/dL 6.3(L) 6.1(L) -  Total Bilirubin 0.3 - 1.2 mg/dL 2.0(H) 1.4(H) -  Alkaline Phos 38 - 126 U/L 61 65 -  AST 15 - 41 U/L 23 20 -  ALT 0 - 44 U/L 14 13 -     Pertinent Microbiology Results for orders placed or performed during the hospital encounter of 10/07/20  Resp Panel by RT-PCR (Flu A&B, Covid) Nasopharyngeal Swab     Status: Abnormal   Collection Time: 10/07/20  7:17 PM   Specimen: Nasopharyngeal Swab; Nasopharyngeal(NP) swabs in vial transport medium  Result Value Ref Range Status   SARS Coronavirus 2 by RT PCR NEGATIVE NEGATIVE Final    Comment: (NOTE) SARS-CoV-2 target nucleic acids are NOT DETECTED.  The SARS-CoV-2 RNA is generally detectable in upper respiratory specimens during the acute phase of infection. The lowest concentration of SARS-CoV-2 viral copies this assay can detect is 138 copies/mL. A negative result does not preclude SARS-Cov-2 infection and should not be used as the sole basis for treatment or other patient management decisions. A negative result may occur with  improper specimen collection/handling, submission of specimen other than nasopharyngeal swab, presence of viral mutation(s) within the areas targeted by this assay, and inadequate number of viral copies(<138 copies/mL). A negative result must be combined with clinical observations, patient history, and epidemiological information. The expected result is Negative.  Fact Sheet for Patients:  EntrepreneurPulse.com.au  Fact  Sheet for Healthcare Providers:  IncredibleEmployment.be  This test is no t yet approved or cleared by the [place] FDA and  has been authorized for detection  and/or diagnosis of SARS-CoV-2 by FDA under an Emergency Use Authorization (EUA). This EUA will remain  in effect (meaning this test can be used) for the duration of the COVID-19 declaration under Section 564(b)(1) of the Act, 21 U.S.C.section 360bbb-3(b)(1), unless the authorization is terminated  or revoked sooner.       Influenza A by PCR POSITIVE (A) NEGATIVE Final   Influenza B by PCR NEGATIVE NEGATIVE Final    Comment: (NOTE) The Xpert Xpress SARS-CoV-2/FLU/RSV plus assay is intended as an aid in the diagnosis of influenza from Nasopharyngeal swab specimens and should not be used as a sole basis for treatment. Nasal washings and aspirates are unacceptable for Xpert Xpress SARS-CoV-2/FLU/RSV testing.  Fact Sheet for Patients: EntrepreneurPulse.com.au  Fact Sheet for Healthcare Providers: IncredibleEmployment.be  This test is not yet approved or cleared by the [place] FDA and has been authorized for detection and/or diagnosis of SARS-CoV-2 by FDA under an Emergency Use Authorization (EUA). This EUA will remain in effect (meaning this test can be used) for the duration of the COVID-19 declaration under Section 564(b)(1) of the Act, 21 U.S.C. section 360bbb-3(b)(1), unless the authorization is terminated or revoked.  Performed at [facility], [place] [address] [place], [place] [address]      All pertinent labs/Imagings/notes reviewed. All pertinent plain films and CT images have been personally visualized and interpreted; radiology reports have been reviewed. Decision making incorporated into the Impression / Recommendations.  I spent greater than 25 minutes with the patient including  review of prior medical records with greater than 50% of time in face to face counsel of the patient.    Electronically signed by:  Rosiland Oz, MD Infectious Disease Physician [facility] [address] [place], [place] [address] Phone: [phone]  Fax: [phone]

## 2020-10-17 ENCOUNTER — Telehealth: Payer: Self-pay

## 2020-10-17 NOTE — Telephone Encounter (Signed)
[facility] Patient Advocate Encounter  Prior Authorization for [facility] has been approved.    PA# [id number] Effective dates: 10/17/20 through 10/10/21  Patients co-pay is $195.00.   [facility] will continue to follow.  [name], [facility] Patient [facility] for Infectious Disease Phone: [phone] Fax:  [phone]

## 2020-10-20 DIAGNOSIS — D508 Other iron deficiency anemias: Secondary | ICD-10-CM | POA: Diagnosis not present

## 2020-10-23 DIAGNOSIS — D509 Iron deficiency anemia, unspecified: Secondary | ICD-10-CM | POA: Diagnosis not present

## 2020-10-23 DIAGNOSIS — K219 Gastro-esophageal reflux disease without esophagitis: Secondary | ICD-10-CM | POA: Diagnosis not present

## 2020-10-25 ENCOUNTER — Other Ambulatory Visit: Payer: Self-pay | Admitting: Cardiology

## 2020-11-01 ENCOUNTER — Other Ambulatory Visit: Payer: Self-pay | Admitting: Cardiology

## 2020-11-01 DIAGNOSIS — I10 Essential (primary) hypertension: Secondary | ICD-10-CM

## 2020-11-06 DIAGNOSIS — G5732 Lesion of lateral popliteal nerve, left lower limb: Secondary | ICD-10-CM | POA: Diagnosis not present

## 2020-11-13 NOTE — Progress Notes (Signed)
Primary Physician:  Deland Pretty, MD   Patient ID: Troy Sanchez, male    DOB: 09/03/1941, 80 y.o.   MRN: [id number]  Subjective:    Chief Complaint  Patient presents with  . Medication Refill  . Follow-up    HPI: Troy Sanchez  is a 80 y.o. male  with hypertension, hyperlipidemia, chronic dyspnea and also pulmonary scarring noted on CT scan performed in 2019; therefore, dyspnea felt to be related to ILD. Also has history of esophageal dilation. He has moderate CAD bycoronary angiography in 2017 which revealed moderate disease of 50% and LAD and circumflex coronary artery with normal LVEF and mild pulmonary hypertension.  Echocardiogram in 2018 revealed normal LVEF with mild RV strain, patient was referred to Dr. Melvyn Novas, CT scan of the chest revealed changes related to interstitial lung disease as etiology of dyspnea.   Patient was last seen in our office 06/2019 by Dr. Einar Gip, at which time patient was stable and recommended for as needed follow up. Patient now presents for follow up of hypertension, moderate CAD, mild pulmonary hypertension, and bradycardia.  Patient reports he is presently doing well, dyspnea on exertion and bilateral lower leg edema are both stable.  Patient denies chest pain, palpitations, dizziness, syncope, near syncope, orthopnea, PND.  Notably patient was hospitalized in September 2021 with C. difficile, he continues to follow with infectious disease and is presently on vancomycin.  Upon discharge from the hospital patient's amlodipine was discontinued, his blood pressure remains well controlled.  Past Medical History:  Diagnosis Date  . C. difficile diarrhea   . CAD (coronary artery disease) 07/02/2019  . Hiatal hernia   . HTN (hypertension) 07/02/2019  . Hypertension     Past Surgical History:  Procedure Laterality Date  . CARDIAC CATHETERIZATION N/A 05/18/2016   Procedure: Right/Left Heart Cath and Coronary Angiography;  Surgeon: Adrian Prows, MD;  Location:  [place] [facility];  Service: Cardiovascular;  Laterality: N/A;  . CHOLECYSTECTOMY  2006  . GASTROSTOMY N/A 03/06/2014   Procedure: GASTROSTOMY;  Surgeon: [name], MD;  Location: [facility];  Service: General;  Laterality: N/A;  . HIATAL HERNIA REPAIR N/A 03/06/2014   Procedure: HERNIA REPAIR HIATAL;  Surgeon: [name], MD;  Location: [facility];  Service: General;  Laterality: N/A;  . LAPAROTOMY N/A 03/06/2014   Procedure: EXPLORATORY LAPAROTOMY;  Surgeon: [name], MD;  Location: [facility];  Service: General;  Laterality: N/A;  . TRANSURETHRAL RESECTION OF PROSTATE N/A 08/15/2014   Procedure: TRANSURETHRAL RESECTION OF THE PROSTATE WITH GYRUS INSTRUMENTS;  Surgeon: Malka So, MD;  Location: [facility];  Service: Urology;  Laterality: N/A;  . UMBILICAL HERNIA REPAIR     Social History   Tobacco Use  . Smoking status: Never Smoker  . Smokeless tobacco: Never Used  Substance Use Topics  . Alcohol use: Yes    Comment: ocassionally   Marital Status: Married  ROS  Review of Systems  Constitutional: Negative for decreased appetite, malaise/fatigue, weight gain and weight loss.  Eyes: Negative for visual disturbance.  Cardiovascular: Positive for dyspnea on exertion (stable) and leg swelling (stable). Negative for chest pain, claudication, near-syncope, orthopnea, palpitations, paroxysmal nocturnal dyspnea and syncope.  Respiratory: Negative for cough, hemoptysis, shortness of breath and wheezing.   Endocrine: Negative for cold intolerance and heat intolerance.  Hematologic/Lymphatic: Does not bruise/bleed easily.  Skin: Negative for nail changes.  Musculoskeletal: Negative for muscle weakness and myalgias.  Gastrointestinal: Positive for heartburn (taking PPI). Negative  for abdominal pain, change in bowel habit, melena, nausea and vomiting.  Neurological: Negative for difficulty with concentration, dizziness, focal weakness, headaches and weakness.  Psychiatric/Behavioral: Negative for  altered mental status and suicidal ideas.  All other systems reviewed and are negative.     Objective:  Blood pressure 120/73, pulse 73, temperature 98 F (36.7 C), temperature source Temporal, resp. rate 16, height 5' 10" (1.778 m), weight 186 lb 3.2 oz (84.5 kg), SpO2 96 %. Body mass index is 26.72 kg/m.    Physical Exam Vitals reviewed.  Constitutional:      Appearance: Normal appearance. He is well-developed.  HENT:     Head: Normocephalic and atraumatic.  Cardiovascular:     Rate and Rhythm: Normal rate and regular rhythm.     Pulses: Intact distal pulses.          Carotid pulses are 2+ on the right side and 2+ on the left side.      Radial pulses are 2+ on the right side and 2+ on the left side.       Femoral pulses are 2+ on the right side and 2+ on the left side.      Popliteal pulses are 2+ on the right side and 2+ on the left side.       Dorsalis pedis pulses are 1+ on the right side and 1+ on the left side.       Posterior tibial pulses are 1+ on the right side and 1+ on the left side.     Heart sounds: Normal heart sounds, S1 normal and S2 normal. No murmur heard. No gallop.      Comments: 1+ pitting ankle edema. No JVD Pulmonary:     Effort: Pulmonary effort is normal. No accessory muscle usage or respiratory distress.     Breath sounds: Rales (bilateral bases, left > Right) present. No wheezing or rhonchi.  Abdominal:     General: Bowel sounds are normal.     Palpations: Abdomen is soft.  Musculoskeletal:        General: Normal range of motion.     Cervical back: Normal range of motion.     Right lower leg: Edema present.     Left lower leg: Edema present.  Skin:    General: Skin is warm and dry.  Neurological:     Mental Status: He is alert and oriented to person, place, and time.    Laboratory examination:   CMP Latest Ref Rng & Units 08/21/2020 08/01/2020 07/04/2020  Glucose 70 - 99 mg/dL 101(H) 102(H) 109(H)  BUN 8 - 23 mg/dL 12 11 6(L)  Creatinine  0.61 - 1.24 mg/dL 0.94 1.04 0.95  Sodium 135 - 145 mmol/L 139 135 135  Potassium 3.5 - 5.1 mmol/L 3.9 3.5 3.8  Chloride 98 - 111 mmol/L 103 100 98  CO2 22 - 32 mmol/L _0 Calcium 8.9 - 10.3 mg/dL 9.2 8.7(L) 7.9(L)  Total Protein 6.5 - 8.1 g/dL 6.3(L) 6.1(L) -  Total Bilirubin 0.3 - 1.2 mg/dL 2.0(H) 1.4(H) -  Alkaline Phos 38 - 126 U/L 61 65 -  AST 15 - 41 U/L 23 20 -  ALT 0 - 44 U/L 14 13 -   CBC Latest Ref Rng & Units 08/21/2020 08/01/2020 06/29/2020  WBC 4.0 - 10.5 K/uL 9.2 15.2(H) 11.4(H)  Hemoglobin 13.0 - 17.0 g/dL 11.5(L) 10.2(L) 12.5(L)  Hematocrit 39.0 - 52.0 % 37.3(L) 33.0(L) 41.1  Platelets 150 - 400 K/uL 249 282 323  Lipid Panel  No results found for: CHOL, TRIG, HDL, CHOLHDL, VLDL, LDLCALC, LDLDIRECT HEMOGLOBIN A1C No results found for: HGBA1C, MPG TSH No results for input(s): TSH in the last 8760 hours.  External Labs:  11/16/2017: RBC 5.4, hemoglobin 15.6, hematocrit 47.3, CBC otherwise normal. Creatinine 1.1, EGFR 65, potassium 4.6, CMP normal. Hemoglobin A1c 5.3%. Cholesterol 133, HDL 42, triglycerides 89, LDL 73. Vitamin D 37.  Allergies and Medications   No Known Allergies Current Outpatient Medications  Medication Instructions  . aspirin EC 81 mg, Oral, Daily, Swallow whole.   Marland Kitchen atorvastatin (LIPITOR) 10 mg, Oral, Daily  . cholecalciferol (VITAMIN D) 1,000 Units, Oral, BH-each morning  . isosorbide mononitrate (IMDUR) 60 mg, Oral, Daily  . Multiple Vitamin (MULTIVITAMIN WITH MINERALS) TABS tablet 1 tablet, Oral, Daily  . pantoprazole (PROTONIX) 40 mg, Oral, 2 times daily  . saccharomyces boulardii (FLORASTOR) 250 mg, Oral, 2 times daily  . vancomycin (VANCOCIN) 125 mg, Oral, Every 48 hours   Radiology   No results found.  High-resolution CT chest 06/01/2019: 1. There is a redemonstrated pattern of mild pulmonary fibrosis with an apical to basal gradient featuring irregular peripheral interstitial opacity and minimal associated ground-glass. Mild,  predominantly tubular bronchiectasis. No significant air trapping on expiratory phase imaging. Findings are not significantly changed compared to prior examinations dating back to 04/21/2016 and remain consistent with an "indeterminate for UIP" pattern by ATS pulmonary fibrosis criteria. Per ordering indication, patient has an established diagnosis of IPF. 2.  Coronary artery disease  High-resolution CT scan 01/20/2018: Aortic and coronary atherosclerosis. Patchy peripheral predominant areas of groundglass attenuation, septal thickening and mild subpleural reticulation. Scattered areas of cylindrical bronchiectasis and peripheral bronchiectasis. No significant progression compared to 04/21/2016. Appearance of the lungs is compatible with interstitial lung disease.  Cardiac Studies:   Echocardiogram 07/03/2020: 1. Left ventricular ejection fraction, by estimation, is 65 to 70%. The left ventricle has normal function. The left ventricle has no regional wall motion abnormalities. Left ventricular diastolic parameters are indeterminate.  2. Right ventricular systolic function was not well visualized. The right ventricular size is not well visualized.  3. Left atrial size was moderately dilated.  4. The mitral valve is normal in structure. No evidence of mitral valve regurgitation. No evidence of mitral stenosis.  5. The aortic valve is tricuspid. Aortic valve regurgitation is mild. No aortic stenosis is present.  6. The inferior vena cava is normal in size with greater than 50% respiratory variability, suggesting right atrial pressure of 3 mmHg.  Echocardiogram 10/31/2017: Poor echo window. Wall motion abnormality has reduced sensitivity. Left ventricle cavity is normal in size. LV systolic function appears to be low normal with borderline global hypokinesis. Visual EF is 50-55%. Normal diastolic filling pattern. Left atrial cavity is mildly dilated at 4.1 cm. Right ventricle cavity is mildly  dilated. Normal right ventricular function. A moderateor band is noted at the RV apex (normal variant). Trileaflet aortic valve with mild (Grade I) regurgitation. Trace tricuspid regurgitation. Unable to estimate PA pressure due to absence/minimal TR signal. Compared to the study done on 04/28/2016, no significant change.  Heart Cath 05/18/2016: Mild pulmonary hypertension PA 37/15, mean 25 mmHg. Wedge low at 10 mm Hg. CI decreased. Moderate CAD, Mid LAD 50% and Mid to dist Cx 50%. 30% Mid RCA. LVEF 50-55%.   EKG:   EKG 11/14/2020: Sinus rhythm at a rate of 71 bpm.  Left axis, left anterior fascicular block.  Right bundle branch block.  Poor R wave progression, cannot  exclude anteroseptal infarct old.  Low voltage complexes, likely due to underlying pulmonary disease.  Compared to EKG 07/02/2019, no significant change.  Assessment:     ICD-10-CM   1. Essential hypertension  I10 EKG 12-Lead  2. Coronary artery disease involving native coronary artery of native heart without angina pectoris  I25.10   3. 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 isosorbide mononitrate (IMDUR) 60 MG 24 hr tablet    Sig: Take 1 tablet (60 mg total) by mouth daily.    Dispense:  90 tablet    Refill:  3   Medications Discontinued During This Encounter  Medication Reason  . vancomycin (VANCOCIN) 125 MG capsule Error  . benzonatate (TESSALON) 100 MG capsule Patient has not taken in last 30 days  . famotidine (PEPCID) 20 MG tablet Change in therapy  . ferrous sulfate 325 (65 FE) MG tablet Patient has not taken in last 30 days  . isosorbide mononitrate (IMDUR) 60 MG 24 hr tablet Reorder    Recommendations:  DRAYSEN WEYGANDT  is a 80 y.o. male  with hypertension, hyperlipidemia, moderate CAD, and mild pulmonary hypertension, chronic dyspnea and also pulmonary scarring noted on CT scan performed in 2019; therefore, dyspnea felt to be related to interstitial lung disease. Also has  history of esophageal dilation.   Patient was last seen in our office in 2020 by Dr. Einar Gip, he now presents for follow-up as his PCP would not refill isosorbide mononitrate.  In the past patient has been managed by our office for moderate CAD by coronary angiography in 2017 as well as mild pulmonary hypertension, and primary hypertension.  Patient's amlodipine has been discontinued, his blood pressure remains well controlled with isosorbide mononitrate 60 mg daily.  Patient has also had no recurrence of angina.  Will refill isosorbide mononitrate at this time.  In regard to coronary artery disease, patient is tolerating aspirin and atorvastatin, will continue this.  I personally reviewed and discussed with patient regarding echocardiogram done at the hospital in September 2021 which revealed LVEF of 65-70% increased from 50-55% in 2019.  Patient is feeling well and there are no clinical signs of heart failure.  Recommend continued medical management and blood pressure control.  EKG today remain unchanged compared to previous, except rate has increased to 71 bpm. He continues to be asymptomatic in regard to bifascicular block.  On physical exam patient does have mildly reduced pedal pulses, however he is without symptoms of claudication, therefore we will continue to monitor.  Counseled patient regarding signs and symptoms that would warrant more urgent evaluation in our office including reduced exercise tolerance, dizziness, syncope, near syncope.  Patient verbalized understanding agreement.  Patient continues to follow with Dr. Melvyn Novas and pulmonology closely.  There is no recent lipid profile testing for me to review, patient reports Dr. Shelia Media, his PCP, follows this closely.  Will defer further lipid management to primary care.  Follow-up in 1 year, sooner if needed, for hypertension, CAD, and bifascicular block.   Alethia Berthold, PA-C 11/14/2020, 9:35 AM Office: [phone]

## 2020-11-14 ENCOUNTER — Ambulatory Visit: Payer: Medicare Other | Admitting: Student

## 2020-11-14 ENCOUNTER — Other Ambulatory Visit: Payer: Self-pay

## 2020-11-14 ENCOUNTER — Encounter: Payer: Self-pay | Admitting: Student

## 2020-11-14 VITALS — BP 120/73 | HR 73 | Temp 98.0°F | Resp 16 | Ht 70.0 in | Wt 186.2 lb

## 2020-11-14 DIAGNOSIS — I2510 Atherosclerotic heart disease of native coronary artery without angina pectoris: Secondary | ICD-10-CM | POA: Diagnosis not present

## 2020-11-14 DIAGNOSIS — I452 Bifascicular block: Secondary | ICD-10-CM

## 2020-11-14 DIAGNOSIS — I10 Essential (primary) hypertension: Secondary | ICD-10-CM | POA: Diagnosis not present

## 2020-11-14 MED ORDER — ISOSORBIDE MONONITRATE ER 60 MG PO TB24: 60.0000 mg | ORAL_TABLET | Freq: Every day | ORAL | 3 refills | Status: DC

## 2020-11-18 ENCOUNTER — Ambulatory Visit: Payer: Medicare Other | Admitting: Infectious Diseases

## 2020-11-18 ENCOUNTER — Other Ambulatory Visit: Payer: Self-pay | Admitting: Infectious Diseases

## 2020-11-18 ENCOUNTER — Other Ambulatory Visit: Payer: Self-pay

## 2020-11-18 ENCOUNTER — Telehealth: Payer: Self-pay | Admitting: *Deleted

## 2020-11-18 MED ORDER — VANCOMYCIN HCL 125 MG PO CAPS: 125.0000 mg | ORAL_CAPSULE | ORAL | 1 refills | Status: DC

## 2020-11-18 NOTE — Telephone Encounter (Signed)
Patient states he is not currently having any diarrhea. He is now back interested in zinplava. Scheduled patient next week with Dixon, NP and will send in 2 additional weeks of PO Vancomycin.

## 2020-11-18 NOTE — Telephone Encounter (Signed)
I would check with him if he is still having the diarrhea or not. If he is having diarrhea, he would need to be seen with another provider sooner. I understand that he does not have diarrhea from last encounter.   We also talked about possible getting  an infusion of zinplava last time we spoke. He refused it last time. If this is something he is interested in now, he can be seen earlier with one of the providers as this will need to be given while being actively treated for C diff.   I can refill 2 more weeks of PO Vancomycin if he will be seeing me in March  If he wishes to see a provider earlier than March, I am ok with it.

## 2020-11-18 NOTE — Telephone Encounter (Signed)
Called patient to reschedule his appt due to provider being out of office. Patient appt is 12/16/2020. Patient states he is on an antibiotic and will run out soon. He is wondering do Dr. West Bali want him to take another round of antibiotic . If so he states he will need another refill or stop when he finish the bottle. He is also wondering do she want him to be seen sooner than March with another provider. Patient is requesting a call back.

## 2020-11-20 NOTE — Telephone Encounter (Signed)
Received voicemail from patient stating he needed a refill on medication. RN called patient back, he states another nurse recently called him and told him a refill was sent in, but that he wasn't notified by the pharmacy. RN advised him to call [facility] and check, as it shows a refill for vancomycin was sent in on 11/18/20. Patient verbalized understanding and has no further questions. Reminded patient of upcoming appointment with Janene Madeira, NP.   Beryle Flock, RN

## 2020-11-25 ENCOUNTER — Telehealth: Payer: Self-pay

## 2020-11-25 NOTE — Telephone Encounter (Signed)
[facility] Patient Teacher, English as a foreign language completed.    The patient is insured through [employer] and has a 147.94 copay.  I ran a claim for Zinplava for 1 vial.  We will continue to follow to see if copay assistance is needed.  [name], [facility] Patient [facility] Phone: [phone] Fax:  [phone]

## 2020-11-27 ENCOUNTER — Encounter: Payer: Self-pay | Admitting: Infectious Diseases

## 2020-11-27 ENCOUNTER — Other Ambulatory Visit: Payer: Self-pay

## 2020-11-27 ENCOUNTER — Ambulatory Visit: Payer: Medicare Other | Admitting: Infectious Diseases

## 2020-11-27 VITALS — BP 114/74 | HR 76 | Temp 98.2°F | Wt 186.0 lb

## 2020-11-27 DIAGNOSIS — A0471 Enterocolitis due to Clostridium difficile, recurrent: Secondary | ICD-10-CM

## 2020-11-27 MED ORDER — VANCOMYCIN HCL 125 MG PO CAPS: 125.0000 mg | ORAL_CAPSULE | Freq: Four times a day (QID) | ORAL | 0 refills | Status: AC

## 2020-11-27 NOTE — Assessment & Plan Note (Addendum)
He has had 3 episodes of c difficile colitis since September, and on a long vancomycin taper with every other day dosing now.  We discussed Zinplava - common side effects, cost and schedule through infusion clinic. 10 mg/kg (844 mg) IV x 1 dose. Will have him increase vancomycin to 125 mg QID x 14 days 24 hours prior to his infusion appointment and then stop with close follow up with Dr. West Bali. He understands he needs to be on treatment dose at the time of infusion.

## 2020-11-27 NOTE — Patient Instructions (Addendum)
Will work on getting you scheduled for Zinplava infusion at our outpatient infusion center.   The day before your appointment please increase the vancomycin pills to 4 times a day to take for 14 days. Then we will have you stop and follow up with Dr. West Bali 2 weeks after you finish to see how you are doing.

## 2020-11-27 NOTE — Progress Notes (Signed)
Patient: Troy Sanchez  DOB: 04-Mar-1941 MRN: [id number] PCP: Deland Pretty, MD     Subjective:  Troy Sanchez is a 80 y.o. male here for recurrent CDiff infection.    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 Diarrhea resolved. H/o use of antibiotics prior. 08/01/20 C diff ag and toxin positive. Treated with 2 weeks course of PO Vancomycin. Diarrhea resolved. Difficulty with Copay for Fidaxomicin. 08/21/20 C diff ag and toxin positive- Started on PO Vancomycin for 2 weeks followed by tapering dose by [facility]  Last time he noticed diarrhea was several months ago but he has been on pretty consistent dosing of pulse vancomycin dosing since November after 3rd treatment, now every other day.   He would like to reconsider Zinplava to help lessen the recurrence risk going forward.    Review of Systems  Constitutional: Negative for chills and fever.  HENT: Negative for tinnitus.   Eyes: Negative for blurred vision and photophobia.  Respiratory: Negative for cough and sputum production.   Cardiovascular: Negative for chest pain.  Gastrointestinal: Negative for diarrhea, nausea and vomiting.  Genitourinary: Negative for dysuria.  Skin: Negative for rash.  Neurological: Negative for headaches.    Past Medical History:  Diagnosis Date  . C. difficile diarrhea   . CAD (coronary artery disease) 07/02/2019  . Hiatal hernia   . HTN (hypertension) 07/02/2019  . Hypertension     Outpatient Medications Prior to Visit  Medication Sig Dispense Refill  . aspirin EC 81 MG tablet Take 81 mg by mouth daily. Swallow whole.    Marland Kitchen atorvastatin (LIPITOR) 10 MG tablet Take 10 mg by mouth daily.    . cholecalciferol (VITAMIN D) 1000 UNITS tablet Take 1,000 Units by mouth every morning.     . isosorbide mononitrate (IMDUR) 60 MG 24 hr tablet Take 1 tablet (60 mg total) by mouth daily. 90 tablet 3  . Multiple Vitamin  (MULTIVITAMIN WITH MINERALS) TABS tablet Take 1 tablet by mouth daily. 30 tablet 0  . pantoprazole (PROTONIX) 40 MG tablet Take 40 mg by mouth in the morning and at bedtime.    . saccharomyces boulardii (FLORASTOR) 250 MG capsule Take 1 capsule (250 mg total) by mouth 2 (two) times daily. 60 capsule 2  . vancomycin (VANCOCIN) 125 MG capsule Take 1 capsule (125 mg total) by mouth every other day. 30 capsule 1   No facility-administered medications prior to visit.     No Known Allergies  Social History   Tobacco Use  . Smoking status: Never Smoker  . Smokeless tobacco: Never Used  Vaping Use  . Vaping Use: Never used  Substance Use Topics  . Alcohol use: Yes    Comment: ocassionally  . Drug use: No    No family history on file.  Objective:   Vitals:   11/27/20 1552  BP: 114/74  Pulse: 76  Temp: 98.2 F (36.8 C)  TempSrc: Oral  Weight: 186 lb (84.4 kg)   Body mass index is 26.69 kg/m.  Physical Exam Vitals reviewed.  Constitutional:      Appearance: He is well-developed and well-nourished.     Comments: Seated comfortably in chair during visit.   HENT:     Mouth/Throat:     Mouth: Oropharynx is clear and moist and mucous membranes are normal.     Dentition: Normal dentition. No dental abscesses.  Cardiovascular:  Rate and Rhythm: Normal rate and regular rhythm.     Heart sounds: Normal heart sounds.  Pulmonary:     Effort: Pulmonary effort is normal.     Breath sounds: Normal breath sounds.  Abdominal:     General: There is no distension.     Palpations: Abdomen is soft.     Tenderness: There is no abdominal tenderness.  Lymphadenopathy:     Cervical: No cervical adenopathy.  Skin:    General: Skin is warm and dry.     Findings: No rash.  Neurological:     Mental Status: He is alert and oriented to person, place, and time.  Psychiatric:        Mood and Affect: Mood and affect normal.        Judgment: Judgment normal.     Lab Results: Lab Results   Component Value Date   WBC 9.2 08/21/2020   HGB 11.5 (L) 08/21/2020   HCT 37.3 (L) 08/21/2020   MCV 81.6 08/21/2020   PLT 249 08/21/2020    Lab Results  Component Value Date   CREATININE 0.94 08/21/2020   BUN 12 08/21/2020   NA 139 08/21/2020   K 3.9 08/21/2020   CL 103 08/21/2020   CO2 27 08/21/2020    Lab Results  Component Value Date   ALT 14 08/21/2020   AST 23 08/21/2020   ALKPHOS 61 08/21/2020   BILITOT 2.0 (H) 08/21/2020     Assessment & Plan:   Problem List Items Addressed This Visit      Unprioritized   Recurrent Clostridioides difficile diarrhea - Primary    He has had 3 episodes of c difficile colitis since September, and on a long vancomycin taper with every other day dosing now.  We discussed Zinplava - common side effects, cost and schedule through infusion clinic. 10 mg/kg (844 mg) IV x 1 dose. Will have him increase vancomycin to 125 mg QID x 14 days 24 hours prior to his infusion appointment and then stop with close follow up with Dr. West Bali. He understands he needs to be on treatment dose at the time of infusion.       Relevant Medications   vancomycin (VANCOCIN) 125 MG capsule      Janene Madeira, MSN, NP-C [facility] for [facility] [place] Pager: [phone] Office: [phone]  11/27/20  4:41 PM

## 2020-11-28 ENCOUNTER — Telehealth: Payer: Self-pay

## 2020-11-28 NOTE — Telephone Encounter (Signed)
I have spoke with short stay and patient is scheduled for Zinplava infusion on 12/05/20.  Patient has been informed of appointment date time and location.  Patient verbalized understanding.

## 2020-12-05 ENCOUNTER — Inpatient Hospital Stay (HOSPITAL_COMMUNITY): Admission: RE | Admit: 2020-12-05 | Payer: Medicare Other | Source: Ambulatory Visit

## 2020-12-08 ENCOUNTER — Other Ambulatory Visit (HOSPITAL_COMMUNITY): Payer: Self-pay

## 2020-12-09 ENCOUNTER — Ambulatory Visit (HOSPITAL_COMMUNITY)
Admission: RE | Admit: 2020-12-09 | Discharge: 2020-12-09 | Disposition: A | Discharge status: Home / Self Care | Payer: Medicare Other | Source: Ambulatory Visit | Attending: Infectious Diseases | Admitting: Infectious Diseases

## 2020-12-09 ENCOUNTER — Other Ambulatory Visit: Payer: Self-pay

## 2020-12-09 DIAGNOSIS — A0471 Enterocolitis due to Clostridium difficile, recurrent: Secondary | ICD-10-CM | POA: Insufficient documentation

## 2020-12-09 MED ORDER — SODIUM CHLORIDE 0.9 % IV SOLN
10.0000 mg/kg | Freq: Once | INTRAVENOUS | Status: AC
  Administered 2020-12-09: 844 mg via INTRAVENOUS
  Filled 2020-12-09: qty 33.76

## 2020-12-16 ENCOUNTER — Encounter: Payer: Self-pay | Admitting: Infectious Diseases

## 2020-12-16 ENCOUNTER — Other Ambulatory Visit: Payer: Self-pay

## 2020-12-16 ENCOUNTER — Ambulatory Visit: Payer: Medicare Other | Admitting: Infectious Diseases

## 2020-12-16 VITALS — BP 114/75 | HR 62 | Temp 98.0°F | Ht 70.0 in | Wt 192.0 lb

## 2020-12-16 DIAGNOSIS — A0471 Enterocolitis due to Clostridium difficile, recurrent: Secondary | ICD-10-CM

## 2020-12-16 DIAGNOSIS — Z5181 Encounter for therapeutic drug level monitoring: Secondary | ICD-10-CM

## 2020-12-16 NOTE — Progress Notes (Signed)
[facility]                                                             Troy Sanchez, [name], [place], [address]                                                                  Phn. [phone]; Fax: [phone]                                                                             Date: 12/16/20  Reason for follow up: Recurrent C diff  Assessment 1. 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 Diarrhea resolved. H/o use of antibiotics prior. 08/01/20 C diff ag and toxin positive. Treated with 2 weeks course of PO Vancomycin. Diarrhea resolved. Difficulty with [place] for Fidaxomicin 08/21/20 C diff ag and toxin positive- Started on PO Vancomycin for 2 weeks followed by tapering dose by [facility]. Patient started taking PO Vancomycin 125 mg PO every alternate days since 12/28. Received one dose of Zinplava on 3/1 and currently on PO Vancomycin 125mg  po qid started on 2/28 to complete 14 days total.   Plan Clinically no issues, no diarrhea Complete remaining doses of PO Vancomycin as scheduled for 14 days Fu as needed    All questions and concerns were discussed and addressed. Patient verbalized understanding of the plan.  ____________________________________________________________________________________________________________________ Subjective/Interval events 10/16/2020 Troy Sanchez is here for follow up of Recurrent C diff diarrhea. He says he has been taking PO Vancomycin as instructed last time and is currently taking the PO vancomycin every alternate days since 12/28. Denies having diarrhea. Denies nausea/vomiting and abdominal pain. Denies any rashes, illness.   Discussed about bezlotuxumab infection for preventing C diff. He wants to read about it and decide it later. He also wants to know if insurance  will cover its cost. Will check that with our pharmacy. I have also provided him general patient information about Bezlotuxumab in his AVS.   Of note, he was recently seen in the ED( 12/28) for URI symptoms and was tested positive for Influenza A.  12/16/20 Here for follow up of Recurrent C diff infcetion. Seen by NP Janene Madeira on 2/17 after which patient seems to have got the zinplava infusion on 3/1 and has started taking PO vancomycin 125 mg PO qid one day prior to that. Feels well. Denies N/V/diarrhea and abdominal pain. He is also taking probiotics as well. No complaints today.   ROS: Constitutional: Negative for fever, chills, activity change, appetite change, fatigue and unexpected weight change.  HENT: Negative for congestion, sore throat, rhinorrhea, sneezing, trouble swallowing and sinus pressure.  Eyes: Negative for  photophobia and visual disturbance.  Respiratory: Negative for cough, chest tightness, shortness of breath, wheezing and stridor.  Cardiovascular: Negative for chest pain, palpitations and leg swelling.  Gastrointestinal: Negative for nausea, vomiting, abdominal pain, diarrhea, constipation, blood in stool, abdominal distention and anal bleeding.  Genitourinary: Negative for dysuria, hematuria, flank pain and difficulty urinating.  Musculoskeletal: Negative for myalgias, back pain, joint swelling, arthralgias and gait problem.  Skin: Negative for color change, pallor, rash and wound.  Neurological: Negative for dizziness, tremors, weakness and light-headedness.  Hematological: Negative for adenopathy. Does not bruise/bleed easily.  Psychiatric/Behavioral: Negative for behavioral problems, confusion, sleep disturbance, dysphoric mood, decreased concentration and agitation.   Past Medical History:  Diagnosis Date  . C. difficile diarrhea   . CAD (coronary artery disease) 07/02/2019  . Hiatal hernia   . HTN (hypertension) 07/02/2019  . Hypertension    Past Surgical  History:  Procedure Laterality Date  . CARDIAC CATHETERIZATION N/A 05/18/2016   Procedure: Right/Left Heart Cath and Coronary Angiography;  Surgeon: Adrian Prows, MD;  Location: [facility];  Service: Cardiovascular;  Laterality: N/A;  . CHOLECYSTECTOMY  2006  . GASTROSTOMY N/A 03/06/2014   Procedure: GASTROSTOMY;  Surgeon: [name], MD;  Location: [facility];  Service: General;  Laterality: N/A;  . HIATAL HERNIA REPAIR N/A 03/06/2014   Procedure: HERNIA REPAIR HIATAL;  Surgeon: [name], MD;  Location: [facility];  Service: General;  Laterality: N/A;  . LAPAROTOMY N/A 03/06/2014   Procedure: EXPLORATORY LAPAROTOMY;  Surgeon: [name], MD;  Location: [facility];  Service: General;  Laterality: N/A;  . TRANSURETHRAL RESECTION OF PROSTATE N/A 08/15/2014   Procedure: TRANSURETHRAL RESECTION OF THE PROSTATE WITH GYRUS INSTRUMENTS;  Surgeon: Malka So, MD;  Location: [facility];  Service: Urology;  Laterality: N/A;  . UMBILICAL HERNIA REPAIR     Current Outpatient Medications on File Prior to Visit  Medication Sig Dispense Refill  . aspirin EC 81 MG tablet Take 81 mg by mouth daily. Swallow whole.    Marland Kitchen atorvastatin (LIPITOR) 10 MG tablet Take 10 mg by mouth daily.    . cholecalciferol (VITAMIN D) 1000 UNITS tablet Take 1,000 Units by mouth every morning.     . isosorbide mononitrate (IMDUR) 60 MG 24 hr tablet Take 1 tablet (60 mg total) by mouth daily. 90 tablet 3  . Multiple Vitamin (MULTIVITAMIN WITH MINERALS) TABS tablet Take 1 tablet by mouth daily. 30 tablet 0  . pantoprazole (PROTONIX) 40 MG tablet Take 40 mg by mouth in the morning and at bedtime.    . saccharomyces boulardii (FLORASTOR) 250 MG capsule Take 1 capsule (250 mg total) by mouth 2 (two) times daily. 60 capsule 2  . vancomycin (VANCOCIN) 125 MG capsule Take 125 mg by mouth 4 (four) times daily.     No current facility-administered medications on file prior to visit.   No Known Allergies  Social History   Socioeconomic  History  . Marital status: Married    Spouse name: Not on file  . Number of children: 2  . Years of education: Not on file  . Highest education level: Not on file  Occupational History  . Not on file  Tobacco Use  . Smoking status: Never Smoker  . Smokeless tobacco: Never Used  Vaping Use  . Vaping Use: Never used  Substance and Sexual Activity  . Alcohol use: Not Currently    Comment: ocassionally  . Drug use: No  . Sexual activity: Never  Other Topics Concern  . Not on file  Social History Narrative  . Not on file   Social Determinants of Health   Financial Resource Strain: Not on file  Food Insecurity: Not on file  Transportation Needs: Not on file  Physical Activity: Not on file  Stress: Not on file  Social Connections: Not on file  Intimate Partner Violence: Not on file    Vitals BP 114/75   Pulse 62   Temp 98 F (36.7 C) (Oral)   Ht 5\' 10"  (1.778 m)   Wt 192 lb (87.1 kg)   SpO2 97%   BMI 27.55 kg/m    Examination  General - not in acute distress, comfortably sitting in chair, HARD OF HEARING  HEENT - PEERLA, no pallor and no icterus Chest - b/l clear air entry, no additional sounds CVS- Normal s1s2, RRR Abdomen - Soft, Non tender , non distended Ext- no pedal edema Neuro: grossly normal Back - WNL Psych : calm and cooperative   Recent labs CBC Latest Ref Rng & Units 08/21/2020 08/01/2020 06/29/2020  WBC 4.0 - 10.5 K/uL 9.2 15.2(H) 11.4(H)  Hemoglobin 13.0 - 17.0 g/dL 11.5(L) 10.2(L) 12.5(L)  Hematocrit 39.0 - 52.0 % 37.3(L) 33.0(L) 41.1  Platelets 150 - 400 K/uL 249 282 323   CMP Latest Ref Rng & Units 08/21/2020 08/01/2020 07/04/2020  Glucose 70 - 99 mg/dL 101(H) 102(H) 109(H)  BUN 8 - 23 mg/dL 12 11 6(L)  Creatinine 0.61 - 1.24 mg/dL 0.94 1.04 0.95  Sodium 135 - 145 mmol/L 139 135 135  Potassium 3.5 - 5.1 mmol/L 3.9 3.5 3.8  Chloride 98 - 111 mmol/L 103 100 98  CO2 22 - 32 mmol/L 27 27 30   Calcium 8.9 - 10.3 mg/dL 9.2 8.7(L) 7.9(L)   Total Protein 6.5 - 8.1 g/dL 6.3(L) 6.1(L) -  Total Bilirubin 0.3 - 1.2 mg/dL 2.0(H) 1.4(H) -  Alkaline Phos 38 - 126 U/L 61 65 -  AST 15 - 41 U/L 23 20 -  ALT 0 - 44 U/L 14 13 -     Pertinent Microbiology Results for orders placed or performed during the hospital encounter of 10/07/20  Resp Panel by RT-PCR (Flu A&B, Covid) Nasopharyngeal Swab     Status: Abnormal   Collection Time: 10/07/20  7:17 PM   Specimen: Nasopharyngeal Swab; Nasopharyngeal(NP) swabs in vial transport medium  Result Value Ref Range Status   SARS Coronavirus 2 by RT PCR NEGATIVE NEGATIVE Final    Comment: (NOTE) SARS-CoV-2 target nucleic acids are NOT DETECTED.  The SARS-CoV-2 RNA is generally detectable in upper respiratory specimens during the acute phase of infection. The lowest concentration of SARS-CoV-2 viral copies this assay can detect is 138 copies/mL. A negative result does not preclude SARS-Cov-2 infection and should not be used as the sole basis for treatment or other patient management decisions. A negative result may occur with  improper specimen collection/handling, submission of specimen other than nasopharyngeal swab, presence of viral mutation(s) within the areas targeted by this assay, and inadequate number of viral copies(<138 copies/mL). A negative result must be combined with clinical observations, patient history, and epidemiological information. The expected result is Negative.  Fact Sheet for Patients:  EntrepreneurPulse.com.au  Fact Sheet for Healthcare Providers:  IncredibleEmployment.be  This test is no t yet approved or cleared by the [place] FDA and  has been authorized for detection and/or diagnosis of SARS-CoV-2 by FDA under an Emergency Use Authorization (EUA). This EUA will remain  in effect (meaning  this test can be used) for the duration of the COVID-19 declaration under Section 564(b)(1) of the Act, 21 U.S.C.section  360bbb-3(b)(1), unless the authorization is terminated  or revoked sooner.       Influenza A by PCR POSITIVE (A) NEGATIVE Final   Influenza B by PCR NEGATIVE NEGATIVE Final    Comment: (NOTE) The Xpert Xpress SARS-CoV-2/FLU/RSV plus assay is intended as an aid in the diagnosis of influenza from Nasopharyngeal swab specimens and should not be used as a sole basis for treatment. Nasal washings and aspirates are unacceptable for Xpert Xpress SARS-CoV-2/FLU/RSV testing.  Fact Sheet for Patients: EntrepreneurPulse.com.au  Fact Sheet for Healthcare Providers: IncredibleEmployment.be  This test is not yet approved or cleared by the [place] FDA and has been authorized for detection and/or diagnosis of SARS-CoV-2 by FDA under an Emergency Use Authorization (EUA). This EUA will remain in effect (meaning this test can be used) for the duration of the COVID-19 declaration under Section 564(b)(1) of the Act, 21 U.S.C. section 360bbb-3(b)(1), unless the authorization is terminated or revoked.  Performed at [facility], [place] [address] [place], [place] [address]      Pertinent Imaging All pertinent labs/Imagings/notes reviewed. All pertinent plain films and CT images have been personally visualized and interpreted; radiology reports have been reviewed. Decision making incorporated into the Impression / Recommendations.  I spent 30 minutes with the patient including  review of prior medical records with greater than 50% of time in face to face counsel of the patient.    Electronically signed by:  Rosiland Oz, MD Infectious Disease Physician [facility] [address] [place], [place] [address] Phone: [phone]  Fax: [phone]

## 2020-12-17 DIAGNOSIS — D72819 Decreased white blood cell count, unspecified: Secondary | ICD-10-CM | POA: Diagnosis not present

## 2020-12-17 DIAGNOSIS — E785 Hyperlipidemia, unspecified: Secondary | ICD-10-CM | POA: Diagnosis not present

## 2020-12-17 DIAGNOSIS — D508 Other iron deficiency anemias: Secondary | ICD-10-CM | POA: Diagnosis not present

## 2020-12-17 DIAGNOSIS — R7309 Other abnormal glucose: Secondary | ICD-10-CM | POA: Diagnosis not present

## 2020-12-22 DIAGNOSIS — R600 Localized edema: Secondary | ICD-10-CM | POA: Diagnosis not present

## 2020-12-22 DIAGNOSIS — Z0001 Encounter for general adult medical examination with abnormal findings: Secondary | ICD-10-CM | POA: Diagnosis not present

## 2020-12-22 DIAGNOSIS — D72819 Decreased white blood cell count, unspecified: Secondary | ICD-10-CM | POA: Diagnosis not present

## 2020-12-22 DIAGNOSIS — I700 Atherosclerosis of aorta: Secondary | ICD-10-CM | POA: Diagnosis not present

## 2020-12-22 DIAGNOSIS — Z8619 Personal history of other infectious and parasitic diseases: Secondary | ICD-10-CM | POA: Diagnosis not present

## 2020-12-22 DIAGNOSIS — J849 Interstitial pulmonary disease, unspecified: Secondary | ICD-10-CM | POA: Diagnosis not present

## 2020-12-22 DIAGNOSIS — K2100 Gastro-esophageal reflux disease with esophagitis, without bleeding: Secondary | ICD-10-CM | POA: Diagnosis not present

## 2020-12-22 DIAGNOSIS — I2510 Atherosclerotic heart disease of native coronary artery without angina pectoris: Secondary | ICD-10-CM | POA: Diagnosis not present

## 2020-12-22 DIAGNOSIS — G5732 Lesion of lateral popliteal nerve, left lower limb: Secondary | ICD-10-CM | POA: Diagnosis not present

## 2020-12-23 DIAGNOSIS — H6983 Other specified disorders of Eustachian tube, bilateral: Secondary | ICD-10-CM | POA: Diagnosis not present

## 2020-12-23 DIAGNOSIS — H903 Sensorineural hearing loss, bilateral: Secondary | ICD-10-CM | POA: Diagnosis not present

## 2020-12-26 DIAGNOSIS — N281 Cyst of kidney, acquired: Secondary | ICD-10-CM | POA: Diagnosis not present

## 2020-12-26 DIAGNOSIS — R310 Gross hematuria: Secondary | ICD-10-CM | POA: Diagnosis not present

## 2021-01-02 ENCOUNTER — Telehealth: Payer: Self-pay

## 2021-01-02 DIAGNOSIS — A0471 Enterocolitis due to Clostridium difficile, recurrent: Secondary | ICD-10-CM

## 2021-01-02 MED ORDER — FIDAXOMICIN 200 MG PO TABS: 200.0000 mg | ORAL_TABLET | Freq: Two times a day (BID) | ORAL | 0 refills | Status: AC

## 2021-01-02 NOTE — Telephone Encounter (Signed)
Scheduled appointment for patient on 3/30 at 8:45 with Dr. Tommy Medal. Prescription [facility] to start [facility]. No questions at this time.

## 2021-01-02 NOTE — Telephone Encounter (Signed)
Please check with him the frequency, consistency, any recent antibiotic exposure or any other possible things that could be associated with diarrhea.

## 2021-01-02 NOTE — Telephone Encounter (Signed)
Patient called office to inform MD he is having Diarrhea that started Wednesday. Would like to know what he should do next. Will forward message to MD.

## 2021-01-02 NOTE — Telephone Encounter (Signed)
Patient received call from [facility] stating the medication isn't in stock and they'd need to order it. Patient copay would be roughly $1400. Patient asked them to cancel the order as he cannot pay that. Patient reports that he hasn't had any diarrhea today and wants to wait and see how it goes over the weekend. Stated he would call us Monday to let us know whether or not he wants to keep appointment.   Dorie Lorita Officer, RN

## 2021-01-02 NOTE — Telephone Encounter (Signed)
I think that is reasonable.

## 2021-01-02 NOTE — Addendum Note (Signed)
Addended by: Aundria Rud on: 01/02/2021 11:49 AM   Modules accepted: Orders

## 2021-01-02 NOTE — Telephone Encounter (Signed)
He needs to be tested for c diff. Please send him a script of fidaxomicin 200 mg po bid for 10 days until c diff results are back.   Please schedule him for an appointment with me next week.

## 2021-01-02 NOTE — Telephone Encounter (Signed)
Spoke with patient who states when diarrhea started frequency was not as bad, but today he has already experienced runny loose stool 4 times. States diarrhea has a foul odor. Denies any antibiotic exposure and has not taken anything that would cause flare.

## 2021-01-02 NOTE — Telephone Encounter (Signed)
Thank you Cece!

## 2021-01-07 ENCOUNTER — Ambulatory Visit: Payer: Medicare Other | Admitting: Infectious Disease

## 2021-01-07 ENCOUNTER — Other Ambulatory Visit: Payer: Self-pay

## 2021-01-07 ENCOUNTER — Encounter: Payer: Self-pay | Admitting: Infectious Disease

## 2021-01-07 VITALS — BP 128/81 | HR 63 | Temp 97.9°F | Ht 70.0 in | Wt 197.0 lb

## 2021-01-07 DIAGNOSIS — I2510 Atherosclerotic heart disease of native coronary artery without angina pectoris: Secondary | ICD-10-CM

## 2021-01-07 DIAGNOSIS — A0471 Enterocolitis due to Clostridium difficile, recurrent: Secondary | ICD-10-CM | POA: Diagnosis not present

## 2021-01-07 DIAGNOSIS — K219 Gastro-esophageal reflux disease without esophagitis: Secondary | ICD-10-CM | POA: Diagnosis not present

## 2021-01-07 DIAGNOSIS — I10 Essential (primary) hypertension: Secondary | ICD-10-CM | POA: Diagnosis not present

## 2021-01-07 HISTORY — DX: Gastro-esophageal reflux disease without esophagitis: K21.9

## 2021-01-07 NOTE — Progress Notes (Signed)
Subjective:  Chief complaint: Follow-up for C. difficile colitis  Patient ID: Troy Sanchez, male    DOB: 05-25-1941, 80 y.o.   MRN: [id number]  HPI  [name] is a 50 man with a past medical history of coronary disease hypertension hiatal hernia gastroesophageal reflux disease who is been suffering from recurrent bouts of C. difficile colitis.  When he last saw my partner Dr. West Bali finishing of vancomycin pulse and taper and his diarrhea had resolved.  Completely finished his vancomycin and taper to be doing well but this past weekend he had a morning where he had 4 loose bowel movements in the space of 3 hours.  He was concerned and called in about it Dr. West Bali prescription for dificid and for him but he could not afford it.  In the interim his diarrhea completely resolved and he really only had the loose bowel movements that one morning.  Reviewing his medications I noticed that when he was on Protonix which is a dog that puts him at risk for C. difficile colitis.  He tell me that he was on a prior PPI that was changed to Pepcid in the hospital but then he changed back to a PPI again because his heartburn was not well controlled with Pepcid.    Past Medical History:  Diagnosis Date  . C. difficile diarrhea   . CAD (coronary artery disease) 07/02/2019  . Hiatal hernia   . HTN (hypertension) 07/02/2019  . Hypertension     Past Surgical History:  Procedure Laterality Date  . CARDIAC CATHETERIZATION N/A 05/18/2016   Procedure: Right/Left Heart Cath and Coronary Angiography;  Surgeon: Adrian Prows, MD;  Location: [facility];  Service: Cardiovascular;  Laterality: N/A;  . CHOLECYSTECTOMY  2006  . GASTROSTOMY N/A 03/06/2014   Procedure: GASTROSTOMY;  Surgeon: [name], MD;  Location: [facility];  Service: General;  Laterality: N/A;  . HIATAL HERNIA REPAIR N/A 03/06/2014   Procedure: HERNIA REPAIR HIATAL;  Surgeon: [name], MD;  Location: [facility];  Service: General;  Laterality:  N/A;  . LAPAROTOMY N/A 03/06/2014   Procedure: EXPLORATORY LAPAROTOMY;  Surgeon: [name], MD;  Location: [facility];  Service: General;  Laterality: N/A;  . TRANSURETHRAL RESECTION OF PROSTATE N/A 08/15/2014   Procedure: TRANSURETHRAL RESECTION OF THE PROSTATE WITH GYRUS INSTRUMENTS;  Surgeon: Malka So, MD;  Location[facility];  Service: Urology;  Laterality: N/A;  . UMBILICAL HERNIA REPAIR      No family history on file.    Social History   Socioeconomic History  . Marital status: Married    Spouse name: Not on file  . Number of children: 2  . Years of education: Not on file  . Highest education level: Not on file  Occupational History  . Not on file  Tobacco Use  . Smoking status: Never Smoker  . Smokeless tobacco: Never Used  Vaping Use  . Vaping Use: Never used  Substance and Sexual Activity  . Alcohol use: Not Currently    Comment: ocassionally  . Drug use: No  . Sexual activity: Never  Other Topics Concern  . Not on file  Social History Narrative  . Not on file   Social Determinants of Health   Financial Resource Strain: Not on file  Food Insecurity: Not on file  Transportation Needs: Not on file  Physical Activity: Not on file  Stress: Not on file  Social Connections: Not on file    No Known  Allergies   Current Outpatient Medications:  .  aspirin EC 81 MG tablet, Take 81 mg by mouth daily. Swallow whole., Disp: , Rfl:  .  atorvastatin (LIPITOR) 10 MG tablet, Take 10 mg by mouth daily., Disp: , Rfl:  .  cholecalciferol (VITAMIN D) 1000 UNITS tablet, Take 1,000 Units by mouth every morning. , Disp: , Rfl:  .  fidaxomicin (DIFICID) 200 MG TABS tablet, Take 1 tablet (200 mg total) by mouth 2 (two) times daily for 10 days., Disp: 20 tablet, Rfl: 0 .  isosorbide mononitrate (IMDUR) 60 MG 24 hr tablet, Take 1 tablet (60 mg total) by mouth daily., Disp: 90 tablet, Rfl: 3 .  Multiple Vitamin (MULTIVITAMIN WITH MINERALS) TABS tablet, Take 1 tablet by mouth daily.,  Disp: 30 tablet, Rfl: 0 .  pantoprazole (PROTONIX) 40 MG tablet, Take 40 mg by mouth in the morning and at bedtime., Disp: , Rfl:  .  saccharomyces boulardii (FLORASTOR) 250 MG capsule, Take 1 capsule (250 mg total) by mouth 2 (two) times daily., Disp: 60 capsule, Rfl: 2 .  vancomycin (VANCOCIN) 125 MG capsule, Take 125 mg by mouth 4 (four) times daily., Disp: , Rfl:    Review of Systems  Constitutional: Negative for activity change, appetite change, chills, diaphoresis, fatigue, fever and unexpected weight change.  HENT: Negative for congestion, rhinorrhea, sinus pressure, sneezing, sore throat and trouble swallowing.   Eyes: Negative for photophobia and visual disturbance.  Respiratory: Negative for cough, chest tightness, shortness of breath, wheezing and stridor.   Cardiovascular: Negative for chest pain, palpitations and leg swelling.  Gastrointestinal: Negative for abdominal distention, abdominal pain, anal bleeding, blood in stool, constipation, diarrhea, nausea and vomiting.  Genitourinary: Negative for difficulty urinating, dysuria, flank pain and hematuria.  Musculoskeletal: Negative for arthralgias, back pain, gait problem, joint swelling and myalgias.  Skin: Negative for color change, pallor, rash and wound.  Neurological: Negative for dizziness, tremors, weakness and light-headedness.  Hematological: Negative for adenopathy. Does not bruise/bleed easily.  Psychiatric/Behavioral: Negative for agitation, behavioral problems, confusion, decreased concentration, dysphoric mood and sleep disturbance.       Objective:   Physical Exam Constitutional:      Appearance: He is well-developed.  HENT:     Head: Normocephalic and atraumatic.  Eyes:     Conjunctiva/sclera: Conjunctivae normal.  Cardiovascular:     Rate and Rhythm: Normal rate and regular rhythm.  Pulmonary:     Effort: Pulmonary effort is normal. No respiratory distress.     Breath sounds: No wheezing.  Abdominal:      General: There is no distension.     Palpations: Abdomen is soft.  Musculoskeletal:        General: No tenderness. Normal range of motion.     Cervical back: Normal range of motion and neck supple.  Skin:    General: Skin is warm and dry.     Coloration: Skin is not pale.     Findings: No erythema or rash.  Neurological:     General: No focal deficit present.     Mental Status: He is alert and oriented to person, place, and time.  Psychiatric:        Mood and Affect: Mood normal.        Behavior: Behavior normal.        Thought Content: Thought content normal.        Judgment: Judgment normal.           Assessment & Plan:  Recurrent C. difficile  colitis: At present seems less since.  He was interested in being checked for by told him that the testing that we do in our clinic involves PCR testing and would just tell us that the C. difficile was living in his gut which I expect it still is.  It would not tell us if he is having actual disease and we should not be doing testing when the person is not having active diarrhea.  Ultimately he is someone who would benefit from a stool transplant but they are not readily available to my knowledge at this point in time  I suggested the main thing can do is try to reduce risk factors including considering coming off his Protonix and using Pepcid again avoiding unnecessary antibiotics in particular high risk antibiotics such as fluoroquinolones clindamycin and third-generation cephalosporins  I spent greater than 30 minutes with the patient including greater than 50% of time in face to face counsel of the patient and in coordination of their care.

## 2021-01-29 ENCOUNTER — Encounter: Payer: Self-pay | Admitting: Physician Assistant

## 2021-01-29 ENCOUNTER — Ambulatory Visit: Payer: Medicare Other | Admitting: Physician Assistant

## 2021-01-29 VITALS — BP 104/66 | HR 64 | Ht 70.0 in | Wt 192.4 lb

## 2021-01-29 DIAGNOSIS — K209 Esophagitis, unspecified without bleeding: Secondary | ICD-10-CM

## 2021-01-29 DIAGNOSIS — K219 Gastro-esophageal reflux disease without esophagitis: Secondary | ICD-10-CM

## 2021-01-29 MED ORDER — FAMOTIDINE 20 MG PO TABS: 20.0000 mg | ORAL_TABLET | Freq: Two times a day (BID) | ORAL | 8 refills | Status: DC

## 2021-01-29 NOTE — Progress Notes (Signed)
Subjective:    Patient ID: Troy Sanchez, male    DOB: 25-Oct-1940, 80 y.o.   MRN: [id number]  HPI Troy Sanchez is a pleasant 80 year old white male, established with Dr. Loletha Carrow, and also seen previously by myself.  He was last seen here November 2021.  He comes in today with complaints of burning in his esophagus. Patient has history of relapsing C. difficile colitis.  When he was seen here in November he was placed on a long tapering course of vancomycin over 6 weeks.  He had eventually been referred to infectious disease and did receive a Zinplava infusion.  He has not required any antibiotics since March 2022 and says he is doing well currently with normal bowel movements and no complaints of abdominal pain. He had been advised by infectious disease to get off of PPIs and acid blockers, to decrease risk of C. difficile.  He does have history of chronic GERD and prior EGD in October 2019 had revealed grade B esophagitis, and a tortuous distal esophagus, no evidence of Barrett's. He has been taking 20 mg of Pepcid at bedtime but has been off of pantoprazole over the past month or so which had previously been working well to control any GERD symptoms. He complains of a burning discomfort in his esophagus and some increased mucus in his esophagus.  He says this comes and goes throughout the day but is been present every day over the past couple of months.  Interestingly he sometimes feels a little bit better immediately after eating and then symptoms flare later.  He has been taking at least a few Tums every day.  He has no complaints of dysphagia or odynophagia.  Symptoms are nonexertional.  Review of Systems Pertinent positive and negative review of systems were noted in the above HPI section.  All other review of systems was otherwise negative.  Outpatient Encounter Medications as of 01/29/2021  Medication Sig  . aspirin EC 81 MG tablet Take 81 mg by mouth daily. Swallow whole.  Marland Kitchen atorvastatin  (LIPITOR) 10 MG tablet Take 10 mg by mouth daily.  . cholecalciferol (VITAMIN D) 1000 UNITS tablet Take 1,000 Units by mouth every morning.   . isosorbide mononitrate (IMDUR) 60 MG 24 hr tablet Take 1 tablet (60 mg total) by mouth daily.  . Multiple Vitamin (MULTIVITAMIN WITH MINERALS) TABS tablet Take 1 tablet by mouth daily.  . Probiotic Product (PROBIOTIC PO) Take 1 capsule by mouth daily.  . [DISCONTINUED] famotidine (PEPCID) 20 MG tablet Take 20 mg by mouth daily.  . famotidine (PEPCID) 20 MG tablet Take 1 tablet (20 mg total) by mouth 2 (two) times daily before a meal.  . [DISCONTINUED] pantoprazole (PROTONIX) 40 MG tablet Take 40 mg by mouth in the morning and at bedtime.  . [DISCONTINUED] saccharomyces boulardii (FLORASTOR) 250 MG capsule Take 1 capsule (250 mg total) by mouth 2 (two) times daily.  . [DISCONTINUED] vancomycin (VANCOCIN) 125 MG capsule Take 125 mg by mouth 4 (four) times daily. (Patient not taking: Reported on 01/07/2021)   No facility-administered encounter medications on file as of 01/29/2021.   No Known Allergies Patient Active Problem List   Diagnosis Date Noted  . GERD (gastroesophageal reflux disease) 01/07/2021  . 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 Immunization declined 09/16/2020  . Enteritis due to Clostridium difficile 09/01/2020  . Malnutrition of moderate degree 06/28/2020  . Acute colitis 06/24/2020  . Sepsis ([place]) 06/24/2020  . Diarrhea 06/24/2020  . Generalized weakness 06/24/2020  .  Microcytic anemia 06/24/2020  . Leukocytosis 06/24/2020  . Serum total bilirubin elevated 06/24/2020  . Dehydration 06/24/2020  . CAD (coronary artery disease) 07/02/2019  . HTN (hypertension) 07/02/2019  . 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 Dyspnea on exertion 05/16/2016  . 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 Postop check 03/22/2014  . Partial small bowel obstruction ([place]) 03/03/2014  . History of  cholecystectomy 03/03/2014  . Hiatal hernia 03/03/2014  . Bladder mass 03/03/2014   Social History   Socioeconomic History  . Marital status: Married    Spouse name: Not on file  . Number of children: 2  . Years of education: Not on file  . Highest education level: Not on file  Occupational History  . Not on file  Tobacco Use  . Smoking status: Never Smoker  . Smokeless tobacco: Never Used  Vaping Use  . Vaping Use: Never used  Substance and Sexual Activity  . Alcohol use: Yes    Comment: ocassionally  . Drug use: No  . Sexual activity: Never  Other Topics Concern  . Not on file  Social History Narrative  . Not on file   Social Determinants of Health   Financial Resource Strain: Not on file  Food Insecurity: Not on file  Transportation Needs: Not on file  Physical Activity: Not on file  Stress: Not on file  Social Connections: Not on file  Intimate Partner Violence: Not on file    Mr. Troy Sanchez's family history is not on file.      Objective:    Vitals:   01/29/21 0850  BP: 104/66  Pulse: 64    Physical Exam Well-developed well-nourished elderly white male in no acute distress.  Height, Weight, BMI 27.6  HEENT; nontraumatic normocephalic, EOMI, PE R LA, sclera anicteric. Oropharynx; no oral thrush Neck; supple, no JVD Cardiovascular; regular rate and rhythm with S1-S2, no murmur rub or gallop Pulmonary; Clear bilaterally Abdomen; soft, nontender, nondistended, no palpable mass or hepatosplenomegaly, bowel sounds are active Rectal; not done today Skin; benign exam, no jaundice rash or appreciable lesions Extremities; no clubbing cyanosis or edema skin warm and dry Neuro/Psych; alert and oriented x4, grossly nonfocal mood and affect appropriate       Assessment & Plan:   #90 80 year old white male with history of relapsing C. difficile colitis, he had 3 recurrences last relapses since September 2021 and received prolonged courses of vancomycin. He  underwent a Zinplava infusion 12/09/2020. He has been off of vancomycin since the beginning of March 2022. Currently doing well, no complaints of abdominal pain or diarrhea.  #2 chronic GERD-patient had been advised to stop PPIs and acid blockers by infectious disease to decrease risk of recurrent C. difficile. He comes in today with complaints of persistent burning in his esophagus not relieved by 20 mg of Pepcid nightly. No dysphagia or odynophagia, no evidence of oral thrush on exam.  I suspect his symptoms are secondary to GERD and possibly mild esophagitis which she has had previously.  #3 coronary artery disease 4.  Hypertension 5.  Status postcholecystectomy 6.  History of small bowel obstruction  Plan; Will tighten up on antireflux regimen, which was reviewed with the patient, antireflux diet. Increase Pepcid to 20 mg p.o. twice daily AC breakfast and dinner.  I have asked him to try this regimen over the next couple of weeks, and to call back if his symptoms do not significantly improve.  We can try to avoid PPIs but he may  ultimately require an increased dose of H2 blocker if symptoms are not controlled.  Taitum Alms Genia Harold PA-C 01/29/2021   Cc: Deland Pretty, MD

## 2021-01-29 NOTE — Patient Instructions (Addendum)
If you are age 79 or older, your body mass index should be between 23-30. Your Body mass index is 27.61 kg/m. If this is out of the aforementioned range listed, please consider follow up with your Primary Care Provider.  If you are age 64 or younger, your body mass index should be between 19-25. Your Body mass index is 27.61 kg/m. If this is out of the aformentioned range listed, please consider follow up with your Primary Care Provider.   Continue Famotidine 20 mg twice daily before meals.  This has been sent to your pharmacy.  Follow an Anti-Reflux diet and anti-reflux precautions.   Call the office in 2-3 weeks and ask to talk to [facility] if your symptoms are not significantly improved.  Thank you for entrusting me with your care and choosing [facility].  Amy Esterwood, PA-C

## 2021-02-02 NOTE — Progress Notes (Signed)
____________________________________________________________  Attending physician addendum:  Thank you for sending this case to me. I have reviewed the entire note and agree with the plan.  He had low risk findings on the last upper endoscopy, so he could be treated with Carafate slurry as adjunctive or monotherapy if necessary (though more cumbersome to manage).  Wilfrid Lund, MD  ____________________________________________________________

## 2021-02-24 DIAGNOSIS — G5732 Lesion of lateral popliteal nerve, left lower limb: Secondary | ICD-10-CM | POA: Diagnosis not present

## 2021-02-24 DIAGNOSIS — M21372 Foot drop, left foot: Secondary | ICD-10-CM | POA: Diagnosis not present

## 2021-02-24 DIAGNOSIS — M5000 Cervical disc disorder with myelopathy, unspecified cervical region: Secondary | ICD-10-CM | POA: Diagnosis not present

## 2021-03-03 ENCOUNTER — Telehealth: Payer: Self-pay | Admitting: Physician Assistant

## 2021-03-03 NOTE — Telephone Encounter (Signed)
Patient reports unchanged symptoms from when he was seen last. He tells me that he has a burning in his chest, the sensation of needing to cough up what he has swallowed. He can eat nothing, be doing fine, then suddenly have a burning in his chest. He can eat, take his medications and get the burning.  Please advise. Do you want him to come back in?

## 2021-03-03 NOTE — Telephone Encounter (Signed)
Pt states that he is still dealing with Troy Sanchez and would like some advise.

## 2021-03-04 ENCOUNTER — Other Ambulatory Visit: Payer: Self-pay

## 2021-03-04 NOTE — Telephone Encounter (Signed)
Lets increase pepcid to 40 mg po BID ac - send new rx- strict antireflux regimen - call back in a 7-10 days with progress - if not better will add carafate

## 2021-03-04 NOTE — Telephone Encounter (Signed)
Spoke with the patient. He agrees to this plan. He wants to double his present prescription and use it up before he has a new Rx sent to the pharmacy.

## 2021-03-11 DIAGNOSIS — N200 Calculus of kidney: Secondary | ICD-10-CM | POA: Diagnosis not present

## 2021-03-11 DIAGNOSIS — R310 Gross hematuria: Secondary | ICD-10-CM | POA: Diagnosis not present

## 2021-03-24 ENCOUNTER — Telehealth: Payer: Self-pay | Admitting: Physician Assistant

## 2021-03-24 MED ORDER — FAMOTIDINE 40 MG PO TABS: 40.0000 mg | ORAL_TABLET | Freq: Two times a day (BID) | ORAL | 3 refills | Status: DC

## 2021-03-24 NOTE — Telephone Encounter (Signed)
Refill at 40 mg twice daily sent to patient's pharmacy

## 2021-03-24 NOTE — Telephone Encounter (Signed)
Patient is requesting a refill on Famotidine

## 2021-03-31 DIAGNOSIS — H25813 Combined forms of age-related cataract, bilateral: Secondary | ICD-10-CM | POA: Diagnosis not present

## 2021-03-31 DIAGNOSIS — H52203 Unspecified astigmatism, bilateral: Secondary | ICD-10-CM | POA: Diagnosis not present

## 2021-03-31 DIAGNOSIS — H5203 Hypermetropia, bilateral: Secondary | ICD-10-CM | POA: Diagnosis not present

## 2021-04-22 DIAGNOSIS — H25013 Cortical age-related cataract, bilateral: Secondary | ICD-10-CM | POA: Diagnosis not present

## 2021-04-22 DIAGNOSIS — H2513 Age-related nuclear cataract, bilateral: Secondary | ICD-10-CM | POA: Diagnosis not present

## 2021-05-12 DIAGNOSIS — H25011 Cortical age-related cataract, right eye: Secondary | ICD-10-CM | POA: Diagnosis not present

## 2021-05-12 DIAGNOSIS — H25041 Posterior subcapsular polar age-related cataract, right eye: Secondary | ICD-10-CM | POA: Diagnosis not present

## 2021-05-12 DIAGNOSIS — H25811 Combined forms of age-related cataract, right eye: Secondary | ICD-10-CM | POA: Diagnosis not present

## 2021-05-12 DIAGNOSIS — H2511 Age-related nuclear cataract, right eye: Secondary | ICD-10-CM | POA: Diagnosis not present

## 2021-06-08 ENCOUNTER — Encounter (HOSPITAL_COMMUNITY): Payer: Self-pay | Admitting: Emergency Medicine

## 2021-06-08 ENCOUNTER — Emergency Department (HOSPITAL_COMMUNITY)
Admission: EM | Admit: 2021-06-08 | Discharge: 2021-06-09 | Disposition: A | Discharge status: Home / Self Care | Payer: Medicare Other | Attending: Emergency Medicine | Admitting: Emergency Medicine

## 2021-06-08 ENCOUNTER — Other Ambulatory Visit: Payer: Self-pay

## 2021-06-08 DIAGNOSIS — N23 Unspecified renal colic: Secondary | ICD-10-CM | POA: Diagnosis not present

## 2021-06-08 DIAGNOSIS — N200 Calculus of kidney: Secondary | ICD-10-CM

## 2021-06-08 DIAGNOSIS — K449 Diaphragmatic hernia without obstruction or gangrene: Secondary | ICD-10-CM | POA: Diagnosis not present

## 2021-06-08 DIAGNOSIS — I10 Essential (primary) hypertension: Secondary | ICD-10-CM | POA: Diagnosis not present

## 2021-06-08 DIAGNOSIS — Z7982 Long term (current) use of aspirin: Secondary | ICD-10-CM | POA: Diagnosis not present

## 2021-06-08 DIAGNOSIS — Z79899 Other long term (current) drug therapy: Secondary | ICD-10-CM | POA: Insufficient documentation

## 2021-06-08 DIAGNOSIS — K219 Gastro-esophageal reflux disease without esophagitis: Secondary | ICD-10-CM | POA: Insufficient documentation

## 2021-06-08 DIAGNOSIS — K7689 Other specified diseases of liver: Secondary | ICD-10-CM | POA: Insufficient documentation

## 2021-06-08 DIAGNOSIS — N289 Disorder of kidney and ureter, unspecified: Secondary | ICD-10-CM | POA: Insufficient documentation

## 2021-06-08 DIAGNOSIS — I2510 Atherosclerotic heart disease of native coronary artery without angina pectoris: Secondary | ICD-10-CM | POA: Diagnosis not present

## 2021-06-08 DIAGNOSIS — R1032 Left lower quadrant pain: Secondary | ICD-10-CM | POA: Diagnosis present

## 2021-06-08 DIAGNOSIS — N132 Hydronephrosis with renal and ureteral calculous obstruction: Secondary | ICD-10-CM | POA: Diagnosis not present

## 2021-06-08 DIAGNOSIS — M47814 Spondylosis without myelopathy or radiculopathy, thoracic region: Secondary | ICD-10-CM | POA: Diagnosis not present

## 2021-06-08 DIAGNOSIS — N281 Cyst of kidney, acquired: Secondary | ICD-10-CM | POA: Diagnosis not present

## 2021-06-08 NOTE — ED Triage Notes (Signed)
Pt c/o left lower abd pain with vomiting since 1830.

## 2021-06-09 ENCOUNTER — Emergency Department (HOSPITAL_COMMUNITY): Payer: Medicare Other

## 2021-06-09 DIAGNOSIS — K449 Diaphragmatic hernia without obstruction or gangrene: Secondary | ICD-10-CM | POA: Diagnosis not present

## 2021-06-09 DIAGNOSIS — N281 Cyst of kidney, acquired: Secondary | ICD-10-CM | POA: Diagnosis not present

## 2021-06-09 DIAGNOSIS — K7689 Other specified diseases of liver: Secondary | ICD-10-CM | POA: Diagnosis not present

## 2021-06-09 DIAGNOSIS — N132 Hydronephrosis with renal and ureteral calculous obstruction: Secondary | ICD-10-CM | POA: Diagnosis not present

## 2021-06-09 DIAGNOSIS — M47814 Spondylosis without myelopathy or radiculopathy, thoracic region: Secondary | ICD-10-CM | POA: Diagnosis not present

## 2021-06-09 DIAGNOSIS — N200 Calculus of kidney: Secondary | ICD-10-CM | POA: Diagnosis not present

## 2021-06-09 DIAGNOSIS — I10 Essential (primary) hypertension: Secondary | ICD-10-CM | POA: Diagnosis not present

## 2021-06-09 LAB — CBC WITH DIFFERENTIAL/PLATELET
Abs Immature Granulocytes: 0.03 10*3/uL (ref 0.00–0.07)
Basophils Absolute: 0 10*3/uL (ref 0.0–0.1)
Basophils Relative: 1 %
Eosinophils Absolute: 0.1 10*3/uL (ref 0.0–0.5)
Eosinophils Relative: 2 %
HCT: 42.6 % (ref 39.0–52.0)
Hemoglobin: 14.2 g/dL (ref 13.0–17.0)
Immature Granulocytes: 0 %
Lymphocytes Relative: 12 %
Lymphs Abs: 0.8 10*3/uL (ref 0.7–4.0)
MCH: 30.1 pg (ref 26.0–34.0)
MCHC: 33.3 g/dL (ref 30.0–36.0)
MCV: 90.3 fL (ref 80.0–100.0)
Monocytes Absolute: 0.5 10*3/uL (ref 0.1–1.0)
Monocytes Relative: 8 %
Neutro Abs: 5.4 10*3/uL (ref 1.7–7.7)
Neutrophils Relative %: 77 %
Platelets: 143 10*3/uL — ABNORMAL LOW (ref 150–400)
RBC: 4.72 MIL/uL (ref 4.22–5.81)
RDW: 13.8 % (ref 11.5–15.5)
WBC: 7 10*3/uL (ref 4.0–10.5)
nRBC: 0 % (ref 0.0–0.2)

## 2021-06-09 LAB — URINALYSIS, ROUTINE W REFLEX MICROSCOPIC
Bacteria, UA: NONE SEEN
Bilirubin Urine: NEGATIVE
Glucose, UA: NEGATIVE mg/dL
Ketones, ur: 5 mg/dL — AB
Leukocytes,Ua: NEGATIVE
Nitrite: NEGATIVE
Protein, ur: NEGATIVE mg/dL
RBC / HPF: >50 RBC/hpf — ABNORMAL HIGH (ref 0–5)
Specific Gravity, Urine: 1.014 (ref 1.005–1.030)
pH: 6 (ref 5.0–8.0)

## 2021-06-09 LAB — COMPREHENSIVE METABOLIC PANEL
ALT: 14 U/L (ref 0–44)
AST: 24 U/L (ref 15–41)
Albumin: 3.8 g/dL (ref 3.5–5.0)
Alkaline Phosphatase: 54 U/L (ref 38–126)
Anion gap: 4 — ABNORMAL LOW (ref 5–15)
BUN: 21 mg/dL (ref 8–23)
CO2: 29 mmol/L (ref 22–32)
Calcium: 9.2 mg/dL (ref 8.9–10.3)
Chloride: 104 mmol/L (ref 98–111)
Creatinine, Ser: 1.62 mg/dL — ABNORMAL HIGH (ref 0.61–1.24)
GFR, Estimated: 43 mL/min — ABNORMAL LOW (ref >60)
Glucose, Bld: 113 mg/dL — ABNORMAL HIGH (ref 70–99)
Potassium: 4.8 mmol/L (ref 3.5–5.1)
Sodium: 137 mmol/L (ref 135–145)
Total Bilirubin: 1.3 mg/dL — ABNORMAL HIGH (ref 0.3–1.2)
Total Protein: 6.6 g/dL (ref 6.5–8.1)

## 2021-06-09 LAB — LIPASE, BLOOD: Lipase: 27 U/L (ref 11–51)

## 2021-06-09 MED ORDER — HYDROCODONE-ACETAMINOPHEN 5-325 MG PO TABS: 1.0000 | ORAL_TABLET | Freq: Four times a day (QID) | ORAL | 0 refills | Status: DC | PRN

## 2021-06-09 MED ORDER — ONDANSETRON 8 MG PO TBDP: 8.0000 mg | ORAL_TABLET | Freq: Three times a day (TID) | ORAL | 0 refills | Status: DC | PRN

## 2021-06-09 NOTE — ED Notes (Signed)
Pt rec'd d/c papers. Ambulatory to lobby with son.

## 2021-06-09 NOTE — ED Provider Notes (Signed)
[facility] EMERGENCY DEPARTMENT Provider Note   CSN: [id number] Arrival date & time: 06/08/21  2309     History Chief Complaint  Patient presents with   Abdominal Pain    Troy Sanchez is a 80 y.o. male.  The history is provided by the patient.  Abdominal Pain Pain location:  LLQ Pain quality: aching and sharp   Pain radiates to:  Does not radiate Pain severity:  Severe Onset quality:  Sudden Duration:  6 hours Timing:  Constant Progression:  Improving Chronicity:  New Relieved by:  None tried Worsened by:  Nothing Associated symptoms: vomiting   Associated symptoms: no chest pain, no cough, no diarrhea, no dysuria, no fever, no hematemesis and no hematochezia   Patient with history of CAD, hypertension presents with abdominal pain.  Patient reports he got off of work and began having left lower quadrant abdominal pain.  He went to [facility] to purchase M&Ms and it caused him to vomit.  He reports now the pain is improving.  No fevers.  No flank pain.  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   Immunization declined 09/16/2020   Enteritis due to Clostridium difficile 09/01/2020   Malnutrition of moderate degree 06/28/2020   Acute colitis 06/24/2020   Sepsis ([place]) 06/24/2020   Diarrhea 06/24/2020   Generalized weakness 06/24/2020   Microcytic anemia 06/24/2020   Leukocytosis 06/24/2020   Serum total bilirubin elevated 06/24/2020   Dehydration 06/24/2020   CAD (coronary artery disease)  07/02/2019   HTN (hypertension) 07/02/2019   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   Dyspnea on exertion 05/16/2016   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/2014   Postop check 03/22/2014   Partial small bowel obstruction ([place]) 03/03/2014   History of cholecystectomy 03/03/2014   Hiatal hernia 03/03/2014   Bladder mass 03/03/2014    Past Surgical History:  Procedure Laterality Date   CARDIAC CATHETERIZATION N/A 05/18/2016   Procedure: Right/Left Heart Cath and Coronary Angiography;  Surgeon: Adrian Prows, MD;  Location: [place] [facility];  Service: Cardiovascular;  Laterality: N/A;   CHOLECYSTECTOMY  2006   GASTROSTOMY N/A 03/06/2014   Procedure: GASTROSTOMY;  Surgeon: [name], MD;  Location: [place];  Service: General;  Laterality: N/A;   HIATAL HERNIA REPAIR N/A 03/06/2014   Procedure: HERNIA REPAIR HIATAL;  Surgeon: [name], MD;  Location: [place];  Service: General;  Laterality: N/A;   LAPAROTOMY N/A 03/06/2014   Procedure: EXPLORATORY LAPAROTOMY;  Surgeon: [name], MD;  Location: [place];  Service: General;  Laterality: N/A;   TRANSURETHRAL RESECTION OF PROSTATE N/A 08/15/2014   Procedure: TRANSURETHRAL RESECTION OF THE PROSTATE WITH GYRUS INSTRUMENTS;  Surgeon: Malka So, MD;  Location: [facility];  Service: Urology;  Laterality: N/A;   UMBILICAL HERNIA REPAIR         Family History  Problem Relation Age of Onset   Colon cancer Neg Hx    Esophageal cancer Neg Hx    Stomach cancer Neg Hx    Pancreatic  cancer Neg Hx    Liver disease Neg Hx     Social History   Tobacco Use   Smoking status: Never   Smokeless tobacco: Never  Vaping Use   Vaping Use: Never used  Substance Use Topics   Alcohol use: Yes    Comment: ocassionally   Drug use: No    Home Medications Prior to Admission medications   Medication Sig Start Date End Date Taking? Authorizing Provider  HYDROcodone-acetaminophen (NORCO/VICODIN) 5-325 MG tablet Take 1 tablet by mouth every  6 (six) hours as needed for severe pain. 06/09/21  Yes Ripley Fraise, MD  ondansetron (ZOFRAN ODT) 8 MG disintegrating tablet Take 1 tablet (8 mg total) by mouth every 8 (eight) hours as needed. 06/09/21  Yes Ripley Fraise, MD  aspirin EC 81 MG tablet Take 81 mg by mouth daily. Swallow whole.    [provider]  atorvastatin (LIPITOR) 10 MG tablet Take 10 mg by mouth daily.    [provider]  cholecalciferol (VITAMIN D) 1000 UNITS tablet Take 1,000 Units by mouth every morning.     [provider]  famotidine (PEPCID) 40 MG tablet Take 1 tablet (40 mg total) by mouth 2 (two) times daily before a meal. 03/24/21   Esterwood, Amy S, PA-C  isosorbide mononitrate (IMDUR) 60 MG 24 hr tablet Take 1 tablet (60 mg total) by mouth daily. 11/14/20   Cantwell, Celeste C, PA-C  Multiple Vitamin (MULTIVITAMIN WITH MINERALS) TABS tablet Take 1 tablet by mouth daily. 07/02/20   Swayze, Ava, DO  Probiotic Product (PROBIOTIC PO) Take 1 capsule by mouth daily.    [provider]    Allergies    Patient has no known allergies.  Review of Systems   Review of Systems  Constitutional:  Negative for fever.  Respiratory:  Negative for cough.   Cardiovascular:  Negative for chest pain.  Gastrointestinal:  Positive for abdominal pain and vomiting. Negative for diarrhea, hematemesis and hematochezia.  Genitourinary:  Negative for dysuria and testicular pain.  All other systems reviewed and are negative.  Physical Exam Updated Vital Signs BP 130/85   Pulse (!) 57   Temp 98.5 F (36.9 C) (Oral)   Resp 19   Ht 1.778 m ('5\' 10"'$ )   Wt 87.1 kg   SpO2 96%   BMI 27.55 kg/m   Physical Exam CONSTITUTIONAL: Elderly, no acute distress HEAD: Normocephalic/atraumatic EYES: EOMI/PERRL ENMT: Mucous membranes moist NECK: supple no meningeal signs SPINE/BACK:entire spine nontender CV: S1/S2 noted, no murmurs/rubs/gallops noted LUNGS: Lungs are clear to auscultation bilaterally, no  apparent distress ABDOMEN: soft, nontender, no rebound or guarding, bowel sounds noted throughout abdomen No obvious abdominal hernias GU:no cva tenderness NEURO: Pt is awake/alert/appropriate, moves all extremitiesx4.  No facial droop.   EXTREMITIES: pulses normal/equal, full ROM SKIN: warm, color normal PSYCH: no abnormalities of mood noted, alert and oriented to situation  ED Results / Procedures / Treatments   Labs (all labs ordered are listed, but only abnormal results are displayed) Labs Reviewed  CBC WITH DIFFERENTIAL/PLATELET - Abnormal; Notable for the following components:      Result Value   Platelets 143 (*)    All other components within normal limits  COMPREHENSIVE METABOLIC PANEL - Abnormal; Notable for the following components:   Glucose, Bld 113 (*)    Creatinine, Ser 1.62 (*)    Total Bilirubin 1.3 (*)    GFR, Estimated 43 (*)    Anion gap 4 (*)    All other  components within normal limits  URINALYSIS, ROUTINE W REFLEX MICROSCOPIC - Abnormal; Notable for the following components:   Hgb urine dipstick LARGE (*)    Ketones, ur 5 (*)    RBC / HPF >50 (*)    All other components within normal limits  LIPASE, BLOOD    EKG EKG Interpretation  Date/Time:  Tuesday June 09 2021 00:28:03 EDT Ventricular Rate:  59 PR Interval:  186 QRS Duration: 156 QT Interval:  443 QTC Calculation: 439 R Axis:   -65 Text Interpretation: Sinus rhythm RBBB and LAFB Confirmed by Ripley Fraise [phone]) on 06/09/2021 12:38:08 AM  Radiology CT Renal Stone Study  Result Date: 06/09/2021 CLINICAL DATA:  Lower abdominal pain, vomiting, hematuria EXAM: CT ABDOMEN AND PELVIS WITHOUT CONTRAST TECHNIQUE: Multidetector CT imaging of the abdomen and pelvis was performed following the standard protocol without IV contrast. COMPARISON:  06/24/2020 FINDINGS: Lower chest: Subpleural reticulation/fibrosis in the lung bases. Hepatobiliary: 9 mm cyst in the right hepatic lobe (series 2/image 26).  Unenhanced liver is otherwise unremarkable. Status post cholecystectomy. No intrahepatic or extrahepatic ductal dilatation. Pancreas: Within normal limits. Pancreatic tail extends into the patient's hiatal hernia. Spleen: Within normal limits. Adrenals/Urinary Tract: Adrenal glands are within normal limits. Three nonobstructing right renal calculi measuring up to 5 mm in the right lower pole (series 2/image 50). 2 mm nonobstructing left lower pole renal calculus. Mild left hydroureteronephrosis with associated 3 mm proximal left ureteral calculus at the L4-5 level (coronal image 52). Thick-walled bladder, although underdistended. Stomach/Bowel: Moderate hiatal hernia. No evidence of bowel obstruction. Normal appendix (series 2/image 66). Vascular/Lymphatic: No evidence of abdominal aortic aneurysm. Atherosclerotic calcifications of the abdominal aorta and branch vessels. No suspicious abdominopelvic lymphadenopathy. Reproductive: Prostatomegaly, suggesting BPH. Other: No abdominopelvic ascites. Laxity of the midline anterior abdominal wall (series 2/image 60). Musculoskeletal: Mild degenerative changes of the lower thoracic spine. IMPRESSION: 3 mm proximal left ureteral calculus at the L4-5 level. Associated mild left hydroureteronephrosis. Additional bilateral nonobstructing renal calculi measuring up to 5 mm in the right lower pole. Additional ancillary findings as above. Electronically Signed   By: Julian Hy M.D.   On: 06/09/2021 02:56    Procedures Procedures   Medications Ordered in ED Medications - No data to display  ED Course  I have reviewed the triage vital signs and the nursing notes.  Pertinent labs & imaging results that were available during my care of the patient were reviewed by me and considered in my medical decision making (see chart for details).    MDM Rules/Calculators/A&P                           Patient presented with sudden onset of left lower quadrant abdominal  pain and had some vomiting. Patient previous history of colitis but he reported it felt different. Urinalysis consistent with hematuria and I had a high suspicion for kidney stone.  CT imaging does confirm left ureteral stone.  I personally reviewed the CT scan. Labs also reviewed which reveal renal insufficiency which is new. Patient overall feels improved is in no acute distress and never required medications 3:19 AM Plan is as follows: Zofran and Vicodin as needed for ureteral colic.  Follow-up with urology in 1 week.  Encouraged to increase p.o. fluids due to renal insufficiency that this could be related to kidney stone.  I reviewed his med list and no acute changes needed Also advise follow-up with PCP for reevaluation of hepatic cyst  Final Clinical Impression(s) / ED Diagnoses Final diagnoses:  Ureteral colic  Kidney stone  Hepatic cyst  Renal insufficiency    Rx / DC Orders ED Discharge Orders          Ordered    ondansetron (ZOFRAN ODT) 8 MG disintegrating tablet  Every 8 hours PRN        06/09/21 0317    HYDROcodone-acetaminophen (NORCO/VICODIN) 5-325 MG tablet  Every 6 hours PRN        06/09/21 0317             Ripley Fraise, MD 06/09/21 [phone]

## 2021-06-09 NOTE — ED Notes (Addendum)
Lab outside room. Will get EKG once lab is finished.

## 2021-06-09 NOTE — ED Notes (Signed)
Pt has not had any emesis since moved to tx room.

## 2021-06-09 NOTE — ED Notes (Signed)
Family at bedside. 

## 2021-06-12 DIAGNOSIS — N202 Calculus of kidney with calculus of ureter: Secondary | ICD-10-CM | POA: Diagnosis not present

## 2021-06-12 DIAGNOSIS — R3121 Asymptomatic microscopic hematuria: Secondary | ICD-10-CM | POA: Diagnosis not present

## 2021-06-12 DIAGNOSIS — N170 Acute kidney failure with tubular necrosis: Secondary | ICD-10-CM | POA: Diagnosis not present

## 2021-06-23 DIAGNOSIS — H25812 Combined forms of age-related cataract, left eye: Secondary | ICD-10-CM | POA: Diagnosis not present

## 2021-06-23 DIAGNOSIS — H25012 Cortical age-related cataract, left eye: Secondary | ICD-10-CM | POA: Diagnosis not present

## 2021-06-23 DIAGNOSIS — H2512 Age-related nuclear cataract, left eye: Secondary | ICD-10-CM | POA: Diagnosis not present

## 2021-07-03 DIAGNOSIS — N200 Calculus of kidney: Secondary | ICD-10-CM | POA: Diagnosis not present

## 2021-07-03 DIAGNOSIS — N201 Calculus of ureter: Secondary | ICD-10-CM | POA: Diagnosis not present

## 2021-07-10 DIAGNOSIS — E785 Hyperlipidemia, unspecified: Secondary | ICD-10-CM | POA: Diagnosis not present

## 2021-07-10 DIAGNOSIS — I2510 Atherosclerotic heart disease of native coronary artery without angina pectoris: Secondary | ICD-10-CM | POA: Diagnosis not present

## 2021-07-10 DIAGNOSIS — K2100 Gastro-esophageal reflux disease with esophagitis, without bleeding: Secondary | ICD-10-CM | POA: Diagnosis not present

## 2021-08-03 ENCOUNTER — Other Ambulatory Visit: Payer: Self-pay | Admitting: Physician Assistant

## 2021-08-10 DIAGNOSIS — E785 Hyperlipidemia, unspecified: Secondary | ICD-10-CM | POA: Diagnosis not present

## 2021-08-10 DIAGNOSIS — I2510 Atherosclerotic heart disease of native coronary artery without angina pectoris: Secondary | ICD-10-CM | POA: Diagnosis not present

## 2021-08-10 DIAGNOSIS — K2100 Gastro-esophageal reflux disease with esophagitis, without bleeding: Secondary | ICD-10-CM | POA: Diagnosis not present

## 2021-09-08 DIAGNOSIS — R519 Headache, unspecified: Secondary | ICD-10-CM | POA: Diagnosis not present

## 2021-09-09 ENCOUNTER — Other Ambulatory Visit: Payer: Self-pay | Admitting: Internal Medicine

## 2021-09-09 DIAGNOSIS — R519 Headache, unspecified: Secondary | ICD-10-CM

## 2021-09-10 DIAGNOSIS — R310 Gross hematuria: Secondary | ICD-10-CM | POA: Diagnosis not present

## 2021-09-10 DIAGNOSIS — N200 Calculus of kidney: Secondary | ICD-10-CM | POA: Diagnosis not present

## 2021-09-18 ENCOUNTER — Ambulatory Visit: Payer: Medicare Other | Admitting: Internal Medicine

## 2021-09-21 ENCOUNTER — Encounter: Payer: Self-pay | Admitting: Physician Assistant

## 2021-09-21 ENCOUNTER — Ambulatory Visit: Payer: Medicare Other | Admitting: Physician Assistant

## 2021-09-21 VITALS — BP 120/72 | HR 60 | Ht 70.0 in | Wt 199.0 lb

## 2021-09-21 DIAGNOSIS — K219 Gastro-esophageal reflux disease without esophagitis: Secondary | ICD-10-CM

## 2021-09-21 DIAGNOSIS — R1310 Dysphagia, unspecified: Secondary | ICD-10-CM | POA: Diagnosis not present

## 2021-09-21 MED ORDER — SUCRALFATE 1 GM/10ML PO SUSP: 1.0000 g | Freq: Three times a day (TID) | ORAL | 5 refills | Status: DC

## 2021-09-21 NOTE — Patient Instructions (Signed)
We have sent the following medications to your pharmacy for you to pick up at your convenience: Carafate 10 ml three times daily, 1 hour before or 2 hours after meals.   You have been scheduled for a Barium Esophogram at [facility]iology (1st floor of the hospital) on Monday 09/28/21 at 11 am. Please arrive 15 minutes prior to your appointment for registration. Make certain not to have anything to eat or drink 3 hours prior to your test. If you need to reschedule for any reason, please contact radiology at [phone] to do so. __________________________________________________________________ A barium swallow is an examination that concentrates on views of the esophagus. This tends to be a double contrast exam (barium and two liquids which, when combined, create a gas to distend the wall of the oesophagus) or single contrast (non-ionic iodine based). The study is usually tailored to your symptoms so a good history is essential. Attention is paid during the study to the form, structure and configuration of the esophagus, looking for functional disorders (such as aspiration, dysphagia, achalasia, motility and reflux) EXAMINATION You may be asked to change into a gown, depending on the type of swallow being performed. A radiologist and radiographer will perform the procedure. The radiologist will advise you of the type of contrast selected for your procedure and direct you during the exam. You will be asked to stand, sit or lie in several different positions and to hold a small amount of fluid in your mouth before being asked to swallow while the imaging is performed .In some instances you may be asked to swallow barium coated marshmallows to assess the motility of a solid food bolus. The exam can be recorded as a digital or video fluoroscopy procedure. POST PROCEDURE It will take 1-2 days for the barium to pass through your system. To facilitate this, it is important, unless otherwise directed, to  increase your fluids for the next 24-48hrs and to resume your normal diet.  This test typically takes about 30 minutes to perform. __________________________________________________________________________

## 2021-09-21 NOTE — Progress Notes (Signed)
Chief Complaint: GERD and dysphagia  HPI:    Troy Sanchez is a 80 year old male, known to Dr. Loletha Carrow, with a past medical history of CAD on aspirin (07/03/2020 echo with an LVEF 65-70%), GERD and others listed below, who was referred to me by Deland Pretty, MD for a complaint of GERD and dysphagia.    08/08/2018 EGD with LA grade B reflux esophagitis, tortuous esophagus and otherwise normal.  Dysphagia is appeared to be a combination of lower esophageal anatomy and probable reflux related dysmotility.  He was started on Omeprazole 40 twice daily x6 weeks to decrease to once daily indefinitely.    01/29/2021 patient seen in clinic by Nicoletta Ba, PA-C.  That time discussed that he had a history of relapsing C. difficile colitis and was advised by infectious disease to get off of PPIs and acid blockers to decrease risk of C. difficile.  At that time was taking Pepcid 20 mg at bedtime.  He complained of a burning discomfort in his esophagus and some increased mucus.  At that time recommend he tighten up his antireflux regimen.  His Pepcid was increased to 20 mg twice daily.  It was discussed that he may ultimately require an increased dose of H2 blocker and we could try to avoid PPIs.    03/04/2021 patient's Pepcid was increased to 40 mg twice daily.  Discussed if he is no better then would add Carafate.    06/09/2021 CMP with a creatinine of 1.62 and total bili 1.3.    Today, the patient presents to clinic accompanied by his wife and tells me that he has been having a lot of reflux issues no matter what he eats or if he does not eat.  He is currently taking his Pepcid 40 mg twice a day but does not feel like this quite helps him enough.  Along with this his biggest concern he says is that he has been having trouble swallowing over the past month.  He tells me almost every time he eats something he has problems, specifically with biscuits/bread and meats.  On 1 occasion he had to go make himself vomit in order to  get it out of his throat.    He has had no recurrence of his C. difficile since being treated with Zinplava infusion earlier this year.    Denies fever, chills, weight loss or blood in his stool.  Past Medical History:  Diagnosis Date   CAD (coronary artery disease) 07/02/2019   GERD (gastroesophageal reflux disease) 01/07/2021   Hiatal hernia    HTN (hypertension) 07/02/2019   Hypertension    Recurrent Clostridium difficile diarrhea     Past Surgical History:  Procedure Laterality Date   CARDIAC CATHETERIZATION N/A 05/18/2016   Procedure: Right/Left Heart Cath and Coronary Angiography;  Surgeon: Adrian Prows, MD;  Location: [facility];  Service: Cardiovascular;  Laterality: N/A;   CHOLECYSTECTOMY  2006   GASTROSTOMY N/A 03/06/2014   Procedure: GASTROSTOMY;  Surgeon: [name], MD;  Location: [place];  Service: General;  Laterality: N/A;   HIATAL HERNIA REPAIR N/A 03/06/2014   Procedure: HERNIA REPAIR HIATAL;  Surgeon: [name], MD;  Location: [place];  Service: General;  Laterality: N/A;   LAPAROTOMY N/A 03/06/2014   Procedure: EXPLORATORY LAPAROTOMY;  Surgeon: [name], MD;  Location: [place];  Service: General;  Laterality: N/A;   TRANSURETHRAL RESECTION OF PROSTATE N/A 08/15/2014   Procedure: TRANSURETHRAL RESECTION OF THE PROSTATE WITH GYRUS INSTRUMENTS;  Surgeon: Malka So, MD;  Location: [facility];  Service: Urology;  Laterality: N/A;   UMBILICAL HERNIA REPAIR      Current Outpatient Medications  Medication Sig Dispense Refill   aspirin EC 81 MG tablet Take 81 mg by mouth daily. Swallow whole.     atorvastatin (LIPITOR) 10 MG tablet Take 10 mg by mouth daily.     cholecalciferol (VITAMIN D) 1000 UNITS tablet Take 1,000 Units by mouth every morning.      famotidine (PEPCID) 40 MG tablet TAKE 1 TABLET (40 MG TOTAL) BY MOUTH 2 (TWO) TIMES DAILY BEFORE A MEAL. 180 tablet 1   isosorbide mononitrate (IMDUR) 60 MG 24 hr tablet Take 1 tablet (60 mg total) by mouth daily. 90  tablet 3   Multiple Vitamin (MULTIVITAMIN WITH MINERALS) TABS tablet Take 1 tablet by mouth daily. 30 tablet 0   Probiotic Product (PROBIOTIC PO) Take 1 capsule by mouth daily.     No current facility-administered medications for this visit.    Allergies as of 09/21/2021   (No Known Allergies)    Family History  Problem Relation Age of Onset   Colon cancer Neg Hx    Esophageal cancer Neg Hx    Stomach cancer Neg Hx    Pancreatic cancer Neg Hx    Liver disease Neg Hx     Social History   Socioeconomic History   Marital status: Married    Spouse name: Not on file   Number of children: 2   Years of education: Not on file   Highest education level: Not on file  Occupational History   Not on file  Tobacco Use   Smoking status: Never   Smokeless tobacco: Never  Vaping Use   Vaping Use: Never used  Substance and Sexual Activity   Alcohol use: Yes    Comment: ocassionally   Drug use: No   Sexual activity: Never  Other Topics Concern   Not on file  Social History Narrative   Not on file   Social Determinants of Health   Financial Resource Strain: Not on file  Food Insecurity: Not on file  Transportation Needs: Not on file  Physical Activity: Not on file  Stress: Not on file  Social Connections: Not on file  Intimate Partner Violence: Not on file    Review of Systems:    Constitutional: No weight loss, fever or chills Cardiovascular: No chest pain  Respiratory: No SOB  Gastrointestinal: See HPI and otherwise negative   Physical Exam:  Vital signs: BP 120/72   Pulse 60   Ht 5\' 10"  (1.778 m)   Wt 199 lb (90.3 kg)   BMI 28.55 kg/m    Constitutional:   Pleasant Elderly Caucasian male appears to be in NAD, Well developed, Well nourished, alert and cooperative Respiratory: Respirations even and unlabored. Lungs clear to auscultation bilaterally.   No wheezes, crackles, or rhonchi.  Cardiovascular: Normal S1, S2. No MRG. Regular rate and rhythm. No peripheral  edema, cyanosis or pallor.  Gastrointestinal:  Soft, nondistended, nontender. No rebound or guarding. Normal bowel sounds. No appreciable masses or hepatomegaly. Psychiatric:  Demonstrates good judgement and reason without abnormal affect or behaviors.  RELEVANT LABS AND IMAGING: CBC    Component Value Date/Time   WBC 7.0 06/09/2021 0010   RBC 4.72 06/09/2021 0010   HGB 14.2 06/09/2021 0010   HCT 42.6 06/09/2021 0010   PLT 143 (L) 06/09/2021 0010   MCV 90.3 06/09/2021 0010   MCH 30.1  06/09/2021 0010   MCHC 33.3 06/09/2021 0010   RDW 13.8 06/09/2021 0010   LYMPHSABS 0.8 06/09/2021 0010   MONOABS 0.5 06/09/2021 0010   EOSABS 0.1 06/09/2021 0010   BASOSABS 0.0 06/09/2021 0010    CMP     Component Value Date/Time   NA 137 06/09/2021 0010   K 4.8 06/09/2021 0010   CL 104 06/09/2021 0010   CO2 29 06/09/2021 0010   GLUCOSE 113 (H) 06/09/2021 0010   BUN 21 06/09/2021 0010   CREATININE 1.62 (H) 06/09/2021 0010   CALCIUM 9.2 06/09/2021 0010   PROT 6.6 06/09/2021 0010   ALBUMIN 3.8 06/09/2021 0010   AST 24 06/09/2021 0010   ALT 14 06/09/2021 0010   ALKPHOS 54 06/09/2021 0010   BILITOT 1.3 (H) 06/09/2021 0010   GFRNONAA 43 (L) 06/09/2021 0010   GFRAA >60 07/04/2020 0716    Assessment: 1.  GERD: Increase in symptoms over the past few months regardless of Pepcid 40 twice a day, patient has been taken off of his PPIs given recurrence of C. difficile in the past 2.  Dysphagia: With breads and meats, worse over the past month; likely stricture +/- known tortuous esophagus  Plan: 1.  At this time we will add Carafate 1 g 3 times daily, an hour before or 2 hours after other medications.  Prescribed the liquid as the pills have not been unattainable recently.  5 refills 2.  Continue Pepcid 40 mg twice daily, every morning and nightly. 3.  Scheduled the patient for barium swallow with tablet.  Pending results of this may need to repeat EGD. 4.  Reviewed anti-dysphagia measures and  answered questions. 5.  Discussed with patient ultimately we may need to put him back on a PPI to control all of the symptoms.  At this time we will try to hold off given his difficult to treat/recurrent C. difficile in the past.  Ellouise Newer, PA-C [facility] 09/21/2021, 11:00 AM  Cc: Deland Pretty, MD

## 2021-09-23 NOTE — Progress Notes (Signed)
____________________________________________________________  Attending physician addendum:  Thank you for sending this case to me. I have reviewed the entire note and agree with the plan.  I would like to see the barium study to get a sense of the motility, but he also needs an upper endoscopy scheduled with me. Please have clinical staff work on that, and I would like it to be on a date that is after the barium study result.  Wilfrid Lund, MD  ____________________________________________________________

## 2021-09-28 ENCOUNTER — Other Ambulatory Visit: Payer: Self-pay

## 2021-09-28 ENCOUNTER — Other Ambulatory Visit: Payer: Self-pay | Admitting: Physician Assistant

## 2021-09-28 ENCOUNTER — Ambulatory Visit (HOSPITAL_COMMUNITY)
Admission: RE | Admit: 2021-09-28 | Discharge: 2021-09-28 | Disposition: A | Discharge status: Home / Self Care | Payer: Medicare Other | Source: Ambulatory Visit | Attending: Physician Assistant | Admitting: Physician Assistant

## 2021-09-28 DIAGNOSIS — K219 Gastro-esophageal reflux disease without esophagitis: Secondary | ICD-10-CM | POA: Diagnosis not present

## 2021-09-28 DIAGNOSIS — K225 Diverticulum of esophagus, acquired: Secondary | ICD-10-CM | POA: Diagnosis not present

## 2021-09-28 DIAGNOSIS — R1310 Dysphagia, unspecified: Secondary | ICD-10-CM

## 2021-09-28 DIAGNOSIS — K224 Dyskinesia of esophagus: Secondary | ICD-10-CM | POA: Diagnosis not present

## 2021-09-28 DIAGNOSIS — K449 Diaphragmatic hernia without obstruction or gangrene: Secondary | ICD-10-CM | POA: Diagnosis not present

## 2021-10-02 DIAGNOSIS — H2000 Unspecified acute and subacute iridocyclitis: Secondary | ICD-10-CM | POA: Diagnosis not present

## 2021-10-02 DIAGNOSIS — H04222 Epiphora due to insufficient drainage, left lacrimal gland: Secondary | ICD-10-CM | POA: Diagnosis not present

## 2021-10-07 ENCOUNTER — Other Ambulatory Visit: Payer: Self-pay

## 2021-10-07 DIAGNOSIS — K225 Diverticulum of esophagus, acquired: Secondary | ICD-10-CM

## 2021-10-07 DIAGNOSIS — R1310 Dysphagia, unspecified: Secondary | ICD-10-CM

## 2021-10-07 DIAGNOSIS — K449 Diaphragmatic hernia without obstruction or gangrene: Secondary | ICD-10-CM

## 2021-10-07 DIAGNOSIS — K219 Gastro-esophageal reflux disease without esophagitis: Secondary | ICD-10-CM

## 2021-10-08 ENCOUNTER — Other Ambulatory Visit: Payer: Self-pay

## 2021-10-08 ENCOUNTER — Ambulatory Visit
Admission: RE | Admit: 2021-10-08 | Discharge: 2021-10-08 | Disposition: A | Discharge status: Home / Self Care | Payer: Medicare Other | Source: Ambulatory Visit | Attending: Internal Medicine | Admitting: Internal Medicine

## 2021-10-08 DIAGNOSIS — R519 Headache, unspecified: Secondary | ICD-10-CM

## 2021-10-08 DIAGNOSIS — I6782 Cerebral ischemia: Secondary | ICD-10-CM | POA: Diagnosis not present

## 2021-10-16 ENCOUNTER — Ambulatory Visit (INDEPENDENT_AMBULATORY_CARE_PROVIDER_SITE_OTHER): Payer: Medicare Other

## 2021-10-16 ENCOUNTER — Encounter: Payer: Self-pay | Admitting: Internal Medicine

## 2021-10-16 ENCOUNTER — Other Ambulatory Visit: Payer: Self-pay

## 2021-10-16 ENCOUNTER — Ambulatory Visit: Payer: Medicare Other | Admitting: Internal Medicine

## 2021-10-16 DIAGNOSIS — J8489 Other specified interstitial pulmonary diseases: Secondary | ICD-10-CM

## 2021-10-16 DIAGNOSIS — J849 Interstitial pulmonary disease, unspecified: Secondary | ICD-10-CM | POA: Diagnosis not present

## 2021-10-16 NOTE — Patient Instructions (Addendum)
Make sure you check your oxygen saturation at your highest level of activity to be sure it stays over 90% and keep track of it at least once a week, more often if breathing getting worse, and let me know if losing ground.   GERD (REFLUX)  is an extremely common cause of respiratory symptoms just like yours , many times with no obvious heartburn at all.    It can be treated with medication, but also with lifestyle changes including elevation of the head of your bed (ideally with 6 -8inch blocks under the headboard of your bed),  Smoking cessation, avoidance of late meals, excessive alcohol, and avoid fatty foods, chocolate, peppermint, colas, red wine, and acidic juices such as orange juice.  NO MINT OR MENTHOL PRODUCTS SO NO COUGH DROPS  USE SUGARLESS CANDY INSTEAD (Jolley ranchers or Stover's or Life Savers) or even ice chips will also do - the key is to swallow to prevent all throat clearing. NO OIL BASED VITAMINS - use powdered substitutes.  Avoid fish oil when coughing.    Please remember to go to the  x-ray department  for your tests - we will call you with the results when they are available     Please schedule a follow up visit in 12 months but call sooner if needed

## 2021-10-16 NOTE — Progress Notes (Signed)
Subjective:     Patient ID: Troy Sanchez, male   DOB: 08-30-1941,    MRN: [id number]    Brief patient profile: 89  yowm never smoker with onset of doe around late spring  2017  > referred to [facility] who dx [place] by [place] 05/2016 and concerned about crackles on exam 10/31/17 so referred to pulmonary clinic 11/04/2017 by Troy Sanchez     History of Present Illness  11/04/2017 1st [facility] office visit/ Troy Sanchez   Chief Complaint  Patient presents with   Pulmonary Consult    Referred by Troy Sanchez for eval of pulmonary hypertension.  He states he has had DOE for the past 2 yrs. He states he only gets winded when he walks up an incline.    indolent onset progressive doe = MMRC2 = can't walk a nl pace on a flat grade s sob but does fine slow and flat  Really minimal progression since onset and on no meds for PAH nor resp rx and no assoc  Cough  rec No change rx                11/12/2019  f/u ov/Troy Sanchez re:  PF no chage ex tol Chief Complaint  Patient presents with   Follow-up    Breathing is doing well and no new co's today.   Dyspnea:  Walking neighborhood 15-20 min and stops on steep hills, does not check sats as rec  Cough: none Sleeping: 10 degrees s resp symptoms  SABA use: none 02: none   rec Make sure you check your oxygen saturations at highest level of activity to be sure it stays over 90% and call me if trending downward    02/07/20  Dx of covid minute clinic   - says took vaccine x one dose after initial infection s side effedts and? 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 No new co's today.   Dyspnea:  Walking dogs in [place] area with hills s stopping twice daily  Cough: none Sleeping: hosp bed is at 10 degrees s resp c/o's SABA use: none  02:  none/ sats are running no lower than 93-94%  Apparently took on vaccine ? 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   Dyspnea:  no problem with walking dogs/ hills  Cough: none unless having indigestion  Sleeping: electric bed up 10 degrees  SABA use: none  02: none  Covid status:   vax x one and infected x one    No obvious day to day or daytime variability or assoc excess/ purulent sputum or mucus plugs or hemoptysis or cp or chest tightness, subjective wheeze or overt sinus  symptoms.   sleeping without nocturnal  or early am exacerbation  of respiratory  c/o's or need for noct saba. Also denies any obvious fluctuation of symptoms with weather or environmental changes or other aggravating or alleviating factors except as outlined above   No unusual exposure hx or h/o childhood pna/  asthma or knowledge of premature birth.  Current Allergies, Complete Past Medical History, Past Surgical History, Family History, and Social History were reviewed in [employer] record.  ROS  The following are not active complaints unless bolded Hoarseness, sore throat, dysphagia, mild, dental problems, itching, sneezing,  nasal congestion or discharge of excess mucus or purulent secretions, ear ache,   fever, chills, sweats, unintended wt loss or wt gain, classically pleuritic or exertional cp,  orthopnea pnd or arm/hand swelling  or leg swelling, presyncope, palpitations, abdominal pain, anorexia, nausea, vomiting, diarrhea  or change in bowel habits or change in bladder habits, change in stools or change in urine, dysuria, hematuria,  rash, arthralgias, visual complaints, headache, numbness, weakness or ataxia or problems with walking or coordination,  change in mood or  memory.        Current Meds  Medication Sig   aspirin EC 81  MG tablet Take 81 mg by mouth daily. Swallow whole.   atorvastatin (LIPITOR) 10 MG tablet Take 10 mg by mouth daily.   cholecalciferol (VITAMIN D) 1000 UNITS tablet Take 1,000 Units by mouth every morning.    famotidine (PEPCID) 40 MG tablet TAKE 1 TABLET (40 MG TOTAL) BY MOUTH 2 (TWO) TIMES DAILY BEFORE A MEAL.   isosorbide mononitrate (IMDUR) 60 MG 24 hr tablet Take 1 tablet (60 mg total) by mouth daily.   Multiple Vitamin (MULTIVITAMIN WITH MINERALS) TABS tablet Take 1 tablet by mouth daily.   Probiotic Product (PROBIOTIC PO) Take 1 capsule by mouth daily.                      Objective:   Physical Exam  Wts  10/16/2021         199  09/18/2020         177 11/12/2019          217  05/08/2019        217  07/31/2018      219  01/24/2018        212   12/09/2017        209   11/04/17 206 lb (93.4 kg)  05/18/16 215 lb (97.5 kg)  08/15/14 203 lb 14.8 oz (92.5 kg)     Vital signs reviewed  10/16/2021  - Note at rest 02 sats  97% on RA   General appearance:    robust amb elerly wm nad      HEENT : pt wearing mask not removed for exam due to covid -19 concerns.    NECK :  without JVD/Nodes/TM/ nl carotid upstrokes bilaterally   LUNGS: no acc muscle use,  mild pectus deformity with insp dry crackles both bases  bilaterally without cough on insp or exp maneuvers   CV:  RRR  no s3 or murmur or increase in P2, and no edema   ABD:  soft and nontender with nl inspiratory excursion in the supine position. No bruits or organomegaly appreciated, bowel sounds nl  MS:  Nl gait/ ext warm without deformities, calf tenderness, cyanosis or clubbing No obvious joint restrictions   SKIN: warm and dry without lesions    NEURO:  alert, approp, nl sensorium with  no motor or cerebellar deficits apparent.          CXR PA and Lateral:   10/16/2021 :    I personally reviewed images and agree with radiology impression as follows:     Chronic interstitial lung disease changes as above with  RIGHT basilar atelectasis versus scarring.  No acute infiltrate.     Assessment:

## 2021-10-18 ENCOUNTER — Encounter: Payer: Self-pay | Admitting: Internal Medicine

## 2021-10-18 NOTE — Assessment & Plan Note (Signed)
Onset of symptoms  spring 2017 [place] 05/18/16 with PA mean  17 and wedge 5 and CO  = 5.65  So PVR = 12/5.65 x 80 = 170 (wnl) Echo 10/31/17 :  Low nl with borderline global hypokinesis ef 50-55% and Mild LAE/ RVE / trace MR  - no change vs 04/28/16  - 11/04/2017  Walked RA x 3 laps @ 185 ft each stopped due to  End of study, fast pace, no sob or desat    - PFT's  12/09/2017   FVC 3.82 (90%)  No obst    p nothing prior to study with DLCO ( 19.63) 60 % corrects to (4.04) 87  % for alv volume   - 12/09/2017  Walked RA x 3 laps @ 185 ft each stopped due to  End of study, fast pace, no sob or desat    - ONO RA 12/12/17 desats < 89% x 26 min  - in absence of PH /symptoms do not rec rx  - HRCT 01/20/2018 1. Appearance of the lungs is compatible with interstitial lung disease. CT pattern is considered indeterminate for usual interstitial pneumonia (UIP). 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 - 01/24/2018  Walked RA x 3 laps @ 185 ft each stopped due to  End of study,fast  pace, no sob or desat    - 6 mw 07/31/2018   378 mild sob no desats - 05/08/2019   Walked RA  2 laps @  approx 23ft each @ avg pace  stopped due to  End of study min sob, no cp, sats 94% - PFT's  05/08/2019  FVC  3.74 (89%) prior to study with DLCO  16.92 (67%) corrects to 3.37 (85%)  for alv volume and FV curve s convexity    - Labs 05/08/19  :   Neg collagen vasc profile with esr 7,  HSP serology neg  - HRCT 06/01/19 :  Findings are not significantly changed compared to prior examinations dating back to 04/21/2016 remain non diagnostic  - 11/12/2019   Walked RA x two laps =  approx 561ft @ moderate pace - stopped due to end of study, no sob with sats of 96 % at the end of the study. - 10/16/2021   Walked on RA  x  3  lap(s) =  approx 750  ft  @ mod pace, stopped due to end of study  with lowest 02 sats 97%   No radiographic  or clinical progression of dz > f/u yearly   Advised again re importance of rx for gerd/ GI f/u prn in setting of PF   In meantime advised: Make sure you check your oxygen saturation at your highest level of activity to be sure it stays over 90% and keep track of it at least once a week, more often if breathing getting worse, and let me know if losing ground.   Each maintenance medication was reviewed in detail including emphasizing most importantly the difference between maintenance and prns and under what circumstances the prns are to be triggered using an action plan format where appropriate.  Total time for H and P, chart review, counseling,  directly observing portions of ambulatory 02 saturation study/ and generating customized AVS unique to this office visit / same day charting = 23 min

## 2021-10-20 DIAGNOSIS — G8929 Other chronic pain: Secondary | ICD-10-CM | POA: Diagnosis not present

## 2021-10-20 DIAGNOSIS — R519 Headache, unspecified: Secondary | ICD-10-CM | POA: Diagnosis not present

## 2021-10-20 DIAGNOSIS — J849 Interstitial pulmonary disease, unspecified: Secondary | ICD-10-CM | POA: Diagnosis not present

## 2021-10-21 ENCOUNTER — Ambulatory Visit (AMBULATORY_SURGERY_CENTER): Payer: Medicare Other | Admitting: Gastroenterology

## 2021-10-21 ENCOUNTER — Encounter: Payer: Self-pay | Admitting: Gastroenterology

## 2021-10-21 ENCOUNTER — Other Ambulatory Visit: Payer: Self-pay

## 2021-10-21 VITALS — BP 122/77 | HR 47 | Temp 97.1°F | Resp 18 | Ht 70.0 in | Wt 199.0 lb

## 2021-10-21 DIAGNOSIS — K2100 Gastro-esophageal reflux disease with esophagitis, without bleeding: Secondary | ICD-10-CM

## 2021-10-21 DIAGNOSIS — R1310 Dysphagia, unspecified: Secondary | ICD-10-CM | POA: Diagnosis not present

## 2021-10-21 DIAGNOSIS — R1319 Other dysphagia: Secondary | ICD-10-CM | POA: Diagnosis not present

## 2021-10-21 DIAGNOSIS — K222 Esophageal obstruction: Secondary | ICD-10-CM | POA: Diagnosis not present

## 2021-10-21 MED ORDER — SODIUM CHLORIDE 0.9 % IV SOLN: 500.0000 mL | Freq: Once | INTRAVENOUS | Status: DC

## 2021-10-21 MED ORDER — OMEPRAZOLE 40 MG PO CPDR: 40.0000 mg | DELAYED_RELEASE_CAPSULE | Freq: Two times a day (BID) | ORAL | 3 refills | Status: DC

## 2021-10-21 NOTE — Progress Notes (Signed)
A and O x3. Report to RN. Tolerated MAC anesthesia well. Teeth unchanged after procedure.

## 2021-10-21 NOTE — Progress Notes (Signed)
History and Physical:  This patient presents for endoscopic testing for: Encounter Diagnoses  Name Primary?   Esophageal dysphagia Yes   Gastroesophageal reflux disease without esophagitis     Office note 09/21/21 describing GERD and dysphagia Last EGD 2019  ROS: Patient denies chest pain or cough   Past Medical History: Past Medical History:  Diagnosis Date   CAD (coronary artery disease) 07/02/2019   GERD (gastroesophageal reflux disease) 01/07/2021   Hiatal hernia    HTN (hypertension) 07/02/2019   Hypertension    Recurrent Clostridium difficile diarrhea      Past Surgical History: Past Surgical History:  Procedure Laterality Date   CARDIAC CATHETERIZATION N/A 05/18/2016   Procedure: Right/Left Heart Cath and Coronary Angiography;  Surgeon: Adrian Prows, MD;  Location: [place];  Service: Cardiovascular;  Laterality: N/A;   CHOLECYSTECTOMY  2006   GASTROSTOMY N/A 03/06/2014   Procedure: GASTROSTOMY;  Surgeon: [name], MD;  Location: [place];  Service: General;  Laterality: N/A;   HIATAL HERNIA REPAIR N/A 03/06/2014   Procedure: HERNIA REPAIR HIATAL;  Surgeon: [name], MD;  Location: [place];  Service: General;  Laterality: N/A;   LAPAROTOMY N/A 03/06/2014   Procedure: EXPLORATORY LAPAROTOMY;  Surgeon: [name], MD;  Location: [place];  Service: General;  Laterality: N/A;   TRANSURETHRAL RESECTION OF PROSTATE N/A 08/15/2014   Procedure: TRANSURETHRAL RESECTION OF THE PROSTATE WITH GYRUS INSTRUMENTS;  Surgeon: Malka So, MD;  Location: [facility];  Service: Urology;  Laterality: N/A;   UMBILICAL HERNIA REPAIR      Allergies: No Known Allergies  Outpatient Meds: Current Outpatient Medications  Medication Sig Dispense Refill   aspirin EC 81 MG tablet Take 81 mg by mouth daily. Swallow whole.     atorvastatin (LIPITOR) 10 MG tablet Take 10 mg by mouth daily.     cholecalciferol (VITAMIN D) 1000 UNITS tablet Take 1,000 Units by mouth every morning.       famotidine (PEPCID) 40 MG tablet TAKE 1 TABLET (40 MG TOTAL) BY MOUTH 2 (TWO) TIMES DAILY BEFORE A MEAL. 180 tablet 1   isosorbide mononitrate (IMDUR) 60 MG 24 hr tablet Take 1 tablet (60 mg total) by mouth daily. 90 tablet 3   Multiple Vitamin (MULTIVITAMIN WITH MINERALS) TABS tablet Take 1 tablet by mouth daily. 30 tablet 0   prednisoLONE acetate (PRED FORTE) 1 % ophthalmic suspension Place 1 drop into the left eye 4 (four) times daily.     Probiotic Product (PROBIOTIC PO) Take 1 capsule by mouth daily.     Current Facility-Administered Medications  Medication Dose Route Frequency Provider Last Rate Last Admin   0.9 %  sodium chloride infusion  500 mL Intravenous Once Nelida Meuse III, MD          ___________________________________________________________________ Objective   Exam:  BP 125/76    Pulse (!) 50    Temp (!) 97.1 F (36.2 C)    Resp 14    Ht 5\' 10"  (1.778 m)    Wt 199 lb (90.3 kg)    SpO2 100%    BMI 28.55 kg/m   CV: RRR without murmur, S1/S2 Resp: clear to auscultation bilaterally, normal RR and effort noted GI: soft, no tenderness, with active bowel sounds.   Assessment: Encounter Diagnoses  Name Primary?   Esophageal dysphagia Yes   Gastroesophageal reflux disease without esophagitis      Plan: EGD with possible dilation   The patient is appropriate for an  endoscopic procedure in the ambulatory setting.   - Wilfrid Lund, MD

## 2021-10-21 NOTE — Op Note (Signed)
[facility] Patient Name: Troy Sanchez Procedure Date: 10/21/2021 10:52 AM MRN: [id number] Endoscopist: [place]. Loletha Carrow , MD Age: 81 Referring MD:  Date of Birth: December 06, 1940 Gender: Male Account #: [id number] Procedure:                Upper GI endoscopy Indications:              Esophageal dysphagia, Esophageal reflux symptoms                            that persist despite appropriate therapy, Abnormal                            UGI series (Zenker's diverticulum, dysmotility,                            possible HH or distal esophageal diverticulum)                           PPI was changed to H2RA last year out of concern it                            may have contributed to C diff infection that was                            difficult to clear Medicines:                Monitored Anesthesia Care Procedure:                Pre-Anesthesia Assessment:                           - Prior to the procedure, a History and Physical                            was performed, and patient medications and                            allergies were reviewed. The patient's tolerance of                            previous anesthesia was also reviewed. The risks                            and benefits of the procedure and the sedation                            options and risks were discussed with the patient.                            All questions were answered, and informed consent                            was obtained. Prior Anticoagulants: The patient has  taken no previous anticoagulant or antiplatelet                            agents. ASA Grade Assessment: II - A patient with                            mild systemic disease. After reviewing the risks                            and benefits, the patient was deemed in                            satisfactory condition to undergo the procedure.                           After obtaining informed consent, the  endoscope was                            passed under direct vision. Throughout the                            procedure, the patient's blood pressure, pulse, and                            oxygen saturations were monitored continuously. The                            GIF [id number] #[id number] was introduced through the                            mouth, and advanced to the second part of duodenum.                            The upper GI endoscopy was accomplished without                            difficulty. The patient tolerated the procedure                            well. Scope In: Scope Out: Findings:                 LA Grade C (one or more mucosal breaks continuous                            between tops of 2 or more mucosal folds, less than                            75% circumference) esophagitis was found in the                            distal esophagus.                           One benign-appearing,  intrinsic moderate stenosis                            was found at the gastroesophageal junction. This                            stenosis measured 1 cm (inner diameter) x 1 cm (in                            length). The stenosis was traversed. A TTS dilator                            was passed through the scope. Dilation with a                            12-13.5-15 mm balloon dilator was performed to 15                            mm. The dilation site was examined and showed                            moderate mucosal disruption and moderate                            improvement in luminal narrowing.                           The lower third of the esophagus was moderately                            tortuous.                           The exam of the stomach was otherwise normal,                            including on retroflexion. Fundoplication not                            evident, most likely because displaced superiorly                            as indicated on CT scan.                            The examined duodenum was normal. Complications:            No immediate complications. Estimated Blood Loss:     Estimated blood loss was minimal. Impression:               - LA Grade C reflux esophagitis.                           - Benign-appearing esophageal stenosis. Dilated.                           -  Tortuous esophagus.                           - Normal examined duodenum.                           - No specimens collected. Recommendation:           - Patient has a contact number available for                            emergencies. The signs and symptoms of potential                            delayed complications were discussed with the                            patient. Return to normal activities tomorrow.                            Written discharge instructions were provided to the                            patient.                           - Resume previous diet.                           - Discontinue famotidine                           - Use Prilosec (omeprazole) 40 mg PO BID for 4                            weeks, then decrease to once daily indefinitely.                            Potential increased risk of contracting infection                            from acid suppression outweighed by clear worsening                            of UGI problems without it.                           - Return to my office at appointment to be                            scheduled. (about 6 weeks) Mallie Mussel L. Loletha Carrow, MD 10/21/2021 11:28:29 AM This report has been signed electronically.

## 2021-10-21 NOTE — Patient Instructions (Signed)
Thank you for letting us take care of your healthcare needs today. Please see handouts given to you on Esophagitis and Dilation Diet. Stop the Famotidine. Start Prilosec 40 mg twice a day for 4 weeks then decrease to once a day.    YOU HAD AN ENDOSCOPIC PROCEDURE TODAY AT [facility]:   Refer to the procedure report that was given to you for any specific questions about what was found during the examination.  If the procedure report does not answer your questions, please call your gastroenterologist to clarify.  If you requested that your care partner not be given the details of your procedure findings, then the procedure report has been included in a sealed envelope for you to review at your convenience later.  YOU SHOULD EXPECT: Some feelings of bloating in the abdomen. Passage of more gas than usual.  Walking can help get rid of the air that was put into your GI tract during the procedure and reduce the bloating. If you had a lower endoscopy (such as a colonoscopy or flexible sigmoidoscopy) you may notice spotting of blood in your stool or on the toilet paper. If you underwent a bowel prep for your procedure, you may not have a normal bowel movement for a few days.  Please Note:  You might notice some irritation and congestion in your nose or some drainage.  This is from the oxygen used during your procedure.  There is no need for concern and it should clear up in a day or so.  SYMPTOMS TO REPORT IMMEDIATELY:  Following upper endoscopy (EGD)  Vomiting of blood or coffee ground material  New chest pain or pain under the shoulder blades  Painful or persistently difficult swallowing  New shortness of breath  Fever of 100F or higher  Black, tarry-looking stools  For urgent or emergent issues, a gastroenterologist can be reached at any hour by calling [phone]. Do not use MyChart messaging for urgent concerns.    DIET:  Clear liquid diet to start at 12:15 then soft  diet for the rest of the day. Tomorrow  you may proceed to your regular diet make sure to chew things up well and follow with small drinks every few bites. Drink plenty of fluids but you should avoid alcoholic beverages for 24 hours.  ACTIVITY:  You should plan to take it easy for the rest of today and you should NOT DRIVE or use heavy machinery until tomorrow (because of the sedation medicines used during the test).    FOLLOW UP: Our staff will call the number listed on your records 48-72 hours following your procedure to check on you and address any questions or concerns that you may have regarding the information given to you following your procedure. If we do not reach you, we will leave a message.  We will attempt to reach you two times.  During this call, we will ask if you have developed any symptoms of COVID 19. If you develop any symptoms (ie: fever, flu-like symptoms, shortness of breath, cough etc.) before then, please call [phone].  If you test positive for Covid 19 in the 2 weeks post procedure, please call and report this information to [place].    If any biopsies were taken you will be contacted by phone or by letter within the next 1-3 weeks.  Please call us at [phone] if you have not heard about the biopsies in 3 weeks.    SIGNATURES/CONFIDENTIALITY: You and/or your care  partner have signed paperwork which will be entered into your electronic medical record.  These signatures attest to the fact that that the information above on your After Visit Summary has been reviewed and is understood.  Full responsibility of the confidentiality of this discharge information lies with you and/or your care-partner.

## 2021-10-21 NOTE — Progress Notes (Signed)
Called to room to assist during endoscopic procedure.  Patient ID and intended procedure confirmed with present staff. Received instructions for my participation in the procedure from the performing physician.  

## 2021-10-22 ENCOUNTER — Telehealth: Payer: Self-pay

## 2021-10-22 NOTE — Telephone Encounter (Signed)
Per 10/21/21 procedure report - Return to my office at appt to be scheduled (about 6 weeks).  Called and spoke with patient to set up his appt. He has been scheduled for a 6-week f/u with Dr. Loletha Carrow on Tuesday, 12/08/21 at 9 am. Pt verbalized understanding and had no concerns at the end of the call.

## 2021-10-23 ENCOUNTER — Telehealth: Payer: Self-pay

## 2021-10-23 NOTE — Telephone Encounter (Signed)
°  Follow up Call-  Call back number 10/21/2021  Post procedure Call Back phone  # [phone]  Permission to leave phone message Yes  Some recent data might be hidden     Patient questions:  Do you have a fever, pain , or abdominal swelling? No. Pain Score  0 *  Have you tolerated food without any problems? Yes.    Have you been able to return to your normal activities? Yes.    Do you have any questions about your discharge instructions: Diet   No. Medications  No. Follow up visit  No.  Do you have questions or concerns about your Care? No.  Actions: * If pain score is 4 or above: No action needed, pain <4.  Have you developed a fever since your procedure? no  2.   Have you had an respiratory symptoms (SOB or cough) since your procedure? no  3.   Have you tested positive for COVID 19 since your procedure no  4.   Have you had any family members/close contacts diagnosed with the COVID 19 since your procedure?  no   If yes to any of these questions please route to Joylene John, RN and Joella Prince, RN

## 2021-11-10 DIAGNOSIS — H02135 Senile ectropion of left lower eyelid: Secondary | ICD-10-CM | POA: Diagnosis not present

## 2021-11-10 DIAGNOSIS — H04523 Eversion of bilateral lacrimal punctum: Secondary | ICD-10-CM | POA: Diagnosis not present

## 2021-11-10 DIAGNOSIS — H02203 Unspecified lagophthalmos right eye, unspecified eyelid: Secondary | ICD-10-CM | POA: Diagnosis not present

## 2021-11-10 DIAGNOSIS — H02132 Senile ectropion of right lower eyelid: Secondary | ICD-10-CM | POA: Diagnosis not present

## 2021-11-11 ENCOUNTER — Other Ambulatory Visit: Payer: Self-pay | Admitting: Student

## 2021-11-16 ENCOUNTER — Other Ambulatory Visit: Payer: Self-pay

## 2021-11-16 ENCOUNTER — Encounter: Payer: Self-pay | Admitting: Student

## 2021-11-16 ENCOUNTER — Ambulatory Visit: Payer: Medicare Other | Admitting: Student

## 2021-11-16 VITALS — BP 123/86 | HR 61 | Temp 97.6°F | Ht 70.0 in | Wt 205.0 lb

## 2021-11-16 DIAGNOSIS — I2510 Atherosclerotic heart disease of native coronary artery without angina pectoris: Secondary | ICD-10-CM

## 2021-11-16 DIAGNOSIS — I10 Essential (primary) hypertension: Secondary | ICD-10-CM | POA: Diagnosis not present

## 2021-11-16 DIAGNOSIS — I351 Nonrheumatic aortic (valve) insufficiency: Secondary | ICD-10-CM

## 2021-11-16 NOTE — Progress Notes (Signed)
Primary Physician:  Deland Pretty, MD   Patient ID: Troy Sanchez, male    DOB: [birth year], 81 y.o.   MRN: [id number]  Subjective:    Chief Complaint  Patient presents with   Coronary Artery Disease   Hypertension   Follow-up    HPI: Troy Sanchez  is a 81 y.o. male  with hypertension, hyperlipidemia, chronic dyspnea and also pulmonary scarring noted on CT scan performed in 2019; therefore, dyspnea felt to be related to ILD. Also has history of esophageal dilation. He has moderate CAD by coronary angiography in 2017 which revealed moderate disease of 50% and LAD and circumflex coronary artery with normal LVEF and mild pulmonary hypertension.  Echocardiogram in 2018 revealed normal LVEF with mild RV strain, patient was referred to Dr. Melvyn Novas, CT scan of the chest revealed changes related to interstitial lung disease as etiology of dyspnea.   Patient presents for annual follow-up of hypertension, CAD, and bifascicular block.  At last office visit patient was stable from a cardiovascular standpoint and no changes were made.  Patient is doing well overall and remains asymptomatic.  He continues to work full-time from home and walks on a daily basis without issue.  He continues to follow with pulmonology and infectious disease for interstitial lung disease and recurrent C. difficile infections respectively.  Past Medical History:  Diagnosis Date   CAD (coronary artery disease) 07/02/2019   GERD (gastroesophageal reflux disease) 01/07/2021   Hiatal hernia    HTN (hypertension) 07/02/2019   Hypertension    Recurrent Clostridium difficile diarrhea     Past Surgical History:  Procedure Laterality Date   CARDIAC CATHETERIZATION N/A 05/18/2016   Procedure: Right/Left Heart Cath and Coronary Angiography;  Surgeon: Adrian Prows, MD;  Location: [facility];  Service: Cardiovascular;  Laterality: N/A;   CHOLECYSTECTOMY  2006   GASTROSTOMY N/A 03/06/2014   Procedure: GASTROSTOMY;  Surgeon: [name], MD;  Location: [facility];  Service: General;  Laterality: N/A;   HIATAL HERNIA REPAIR N/A 03/06/2014   Procedure: HERNIA REPAIR HIATAL;  Surgeon: [name], MD;  Location: [facility];  Service: General;  Laterality: N/A;   LAPAROTOMY N/A 03/06/2014   Procedure: EXPLORATORY LAPAROTOMY;  Surgeon: [name], MD;  Location: [facility];  Service: General;  Laterality: N/A;   TRANSURETHRAL RESECTION OF PROSTATE N/A 08/15/2014   Procedure: TRANSURETHRAL RESECTION OF THE PROSTATE WITH GYRUS INSTRUMENTS;  Surgeon: Malka So, MD;  Location: [facility];  Service: Urology;  Laterality: N/A;   UMBILICAL HERNIA REPAIR     Family History  Problem Relation Age of Onset   Colon cancer Neg Hx    Esophageal cancer Neg Hx    Stomach cancer Neg Hx    Pancreatic cancer Neg Hx    Liver disease Neg Hx    Social History   Tobacco Use   Smoking status: Never   Smokeless tobacco: Never  Substance Use Topics   Alcohol use: Yes    Comment: ocassionally   Marital Status: Married  ROS  Review of Systems  Cardiovascular:  Positive for leg swelling (chronic, stable). Negative for chest pain, claudication, near-syncope, orthopnea, palpitations, paroxysmal nocturnal dyspnea and syncope.  Respiratory:  Negative for shortness of breath.   Neurological:  Negative for dizziness.     Objective:  Blood pressure 123/86, pulse 61, temperature 97.6 F (36.4 C), temperature source Temporal, height $RemoveBeforeDE'5\' 10"'gEbmXDgUwTGsMGL$  (1.778 m), weight 205 lb (93 kg), SpO2 95 %. Body mass index  is 29.41 kg/m.    Physical Exam Vitals reviewed.  Constitutional:      General: He is not in acute distress.    Appearance: He is well-developed.  Cardiovascular:     Rate and Rhythm: Normal rate and regular rhythm.     Pulses: Intact distal pulses.          Carotid pulses are 2+ on the right side and 2+ on the left side.      Radial pulses are 2+ on the right side and 2+ on the left side.       Femoral pulses are 2+ on the right side and 2+ on the left  side.      Popliteal pulses are 2+ on the right side and 2+ on the left side.       Dorsalis pedis pulses are 1+ on the right side and 1+ on the left side.       Posterior tibial pulses are 1+ on the right side and 1+ on the left side.     Heart sounds: Normal heart sounds, S1 normal and S2 normal. No murmur heard.   No gallop.     Comments: No JVD Pulmonary:     Effort: Pulmonary effort is normal. No accessory muscle usage or respiratory distress.     Breath sounds: Rales (bilateral bases, left > Right) present. No wheezing or rhonchi.  Musculoskeletal:     Right lower leg: Edema (trace ankle) present.     Left lower leg: Edema (trace ankle) present.   Laboratory examination:   CMP Latest Ref Rng & Units 06/09/2021 08/21/2020 08/01/2020  Glucose 70 - 99 mg/dL 113(H) 101(H) 102(H)  BUN 8 - 23 mg/dL $Remove'21 12 11  'PzHrQbH$ Creatinine 0.61 - 1.24 mg/dL 1.62(H) 0.94 1.04  Sodium 135 - 145 mmol/L 137 139 135  Potassium 3.5 - 5.1 mmol/L 4.8 3.9 3.5  Chloride 98 - 111 mmol/L 104 103 100  CO2 22 - 32 mmol/L $RemoveB'29 27 27  'NDNMSmbn$ Calcium 8.9 - 10.3 mg/dL 9.2 9.2 8.7(L)  Total Protein 6.5 - 8.1 g/dL 6.6 6.3(L) 6.1(L)  Total Bilirubin 0.3 - 1.2 mg/dL 1.3(H) 2.0(H) 1.4(H)  Alkaline Phos 38 - 126 U/L 54 61 65  AST 15 - 41 U/L $Remo'24 23 20  'wtmRN$ ALT 0 - 44 U/L $Remo'14 14 13   'xnTvg$ CBC Latest Ref Rng & Units 06/09/2021 08/21/2020 08/01/2020  WBC 4.0 - 10.5 K/uL 7.0 9.2 15.2(H)  Hemoglobin 13.0 - 17.0 g/dL 14.2 11.5(L) 10.2(L)  Hematocrit 39.0 - 52.0 % 42.6 37.3(L) 33.0(L)  Platelets 150 - 400 K/uL 143(L) 249 282   Lipid Panel  No results found for: CHOL, TRIG, HDL, CHOLHDL, VLDL, LDLCALC, LDLDIRECT HEMOGLOBIN A1C No results found for: HGBA1C, MPG TSH No results for input(s): TSH in the last 8760 hours.  External Labs:  11/16/2017: RBC 5.4, hemoglobin 15.6, hematocrit 47.3, CBC otherwise normal. Creatinine 1.1, EGFR 65, potassium 4.6, CMP normal. Hemoglobin A1c 5.3%. Cholesterol 133, HDL 42, triglycerides 89, LDL 73. Vitamin D  37.  Allergies  No Known Allergies   Medications Prior to Visit:   Outpatient Medications Prior to Visit  Medication Sig Dispense Refill   aspirin EC 81 MG tablet Take 81 mg by mouth daily. Swallow whole.     atorvastatin (LIPITOR) 10 MG tablet Take 10 mg by mouth daily.     cholecalciferol (VITAMIN D) 1000 UNITS tablet Take 1,000 Units by mouth every morning.      isosorbide mononitrate (IMDUR) 60 MG 24 hr  tablet TAKE 1 TABLET BY MOUTH EVERY DAY 90 tablet 3   loratadine (CLARITIN) 10 MG tablet Take 10 mg by mouth daily.     magnesium oxide (MAG-OX) 400 MG tablet Take 400 mg by mouth daily.     Multiple Vitamin (MULTIVITAMIN WITH MINERALS) TABS tablet Take 1 tablet by mouth daily. 30 tablet 0   omeprazole (PRILOSEC) 40 MG capsule Take 1 capsule (40 mg total) by mouth 2 (two) times daily before a meal. Take 40 mg twice a day prior to meals for 4 weeks then decrease to once a day. 90 capsule 3   Probiotic Product (PROBIOTIC PO) Take 1 capsule by mouth daily.     famotidine (PEPCID) 40 MG tablet TAKE 1 TABLET (40 MG TOTAL) BY MOUTH 2 (TWO) TIMES DAILY BEFORE A MEAL. 180 tablet 1   prednisoLONE acetate (PRED FORTE) 1 % ophthalmic suspension Place 1 drop into the left eye 4 (four) times daily.     Facility-Administered Medications Prior to Visit  Medication Dose Route Frequency Provider Last Rate Last Admin   0.9 %  sodium chloride infusion  500 mL Intravenous Once Nelida Meuse III, MD       Final Medications at End of Visit    Current Meds  Medication Sig   aspirin EC 81 MG tablet Take 81 mg by mouth daily. Swallow whole.   atorvastatin (LIPITOR) 10 MG tablet Take 10 mg by mouth daily.   cholecalciferol (VITAMIN D) 1000 UNITS tablet Take 1,000 Units by mouth every morning.    isosorbide mononitrate (IMDUR) 60 MG 24 hr tablet TAKE 1 TABLET BY MOUTH EVERY DAY   loratadine (CLARITIN) 10 MG tablet Take 10 mg by mouth daily.   magnesium oxide (MAG-OX) 400 MG tablet Take 400 mg by mouth  daily.   Multiple Vitamin (MULTIVITAMIN WITH MINERALS) TABS tablet Take 1 tablet by mouth daily.   omeprazole (PRILOSEC) 40 MG capsule Take 1 capsule (40 mg total) by mouth 2 (two) times daily before a meal. Take 40 mg twice a day prior to meals for 4 weeks then decrease to once a day.   Probiotic Product (PROBIOTIC PO) Take 1 capsule by mouth daily.   Current Facility-Administered Medications for the 11/16/21 encounter (Office Visit) with Alethia Berthold, PA-C  Medication   0.9 %  sodium chloride infusion   Radiology   No results found.  High-resolution CT chest 06/01/2019: 1. There is a redemonstrated pattern of mild pulmonary fibrosis with an apical to basal gradient featuring irregular peripheral interstitial opacity and minimal associated ground-glass. Mild, predominantly tubular bronchiectasis. No significant air trapping on expiratory phase imaging. Findings are not significantly changed compared to prior examinations dating back to 04/21/2016 and remain consistent with an "indeterminate for UIP" pattern by ATS pulmonary fibrosis criteria. Per ordering indication, patient has an established diagnosis of IPF.  2.  Coronary artery disease  High-resolution CT scan 01/20/2018: Aortic and coronary atherosclerosis. Patchy peripheral predominant areas of groundglass attenuation, septal thickening and mild subpleural reticulation. Scattered areas of cylindrical bronchiectasis and peripheral bronchiectasis. No significant progression compared to 04/21/2016. Appearance of the lungs is compatible with interstitial lung disease.  Cardiac Studies:   Echocardiogram 07/03/2020:  1. Left ventricular ejection fraction, by estimation, is 65 to 70%. The left ventricle has normal function. The left ventricle has no regional wall motion abnormalities. Left ventricular diastolic parameters are indeterminate.   2. Right ventricular systolic function was not well visualized. The right ventricular size is not  well  visualized.   3. Left atrial size was moderately dilated.   4. The mitral valve is normal in structure. No evidence of mitral valve regurgitation. No evidence of mitral stenosis.   5. The aortic valve is tricuspid. Aortic valve regurgitation is mild. No aortic stenosis is present.   6. The inferior vena cava is normal in size with greater than 50% respiratory variability, suggesting right atrial pressure of 3 mmHg.  Echocardiogram 10/31/2017: Poor echo window. Wall motion abnormality has reduced sensitivity. Left ventricle cavity is normal in size. LV systolic function appears to be low normal with borderline global hypokinesis. Visual EF is 50-55%. Normal diastolic filling pattern. Left atrial cavity is mildly dilated at 4.1 cm. Right ventricle cavity is mildly dilated. Normal right ventricular function. A moderateor band is noted at the RV apex (normal variant). Trileaflet aortic valve with mild (Grade I) regurgitation. Trace tricuspid regurgitation. Unable to estimate PA pressure due to absence/minimal TR signal. Compared to the study done on 04/28/2016, no significant change.  Heart Cath 05/18/2016: Mild pulmonary hypertension PA 37/15, mean 25 mmHg. Wedge low at 10 mm Hg. CI decreased. Moderate CAD, Mid LAD 50% and Mid to dist Cx 50%. 30% Mid RCA. LVEF 50-55%.   EKG:   11/16/2021: Sinus rhythm at a rate of 61 bpm.  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  Low voltage complexes, consider pulmonary disease pattern.  Compared EKG 11/14/2020, no significant change.  Assessment:     ICD-10-CM   1. Essential hypertension  I10 EKG 12-Lead    2. Coronary artery disease involving native coronary artery of native heart without angina pectoris  I25.10 EKG 12-Lead    3. Mild aortic regurgitation  I35.1 PCV ECHOCARDIOGRAM COMPLETE      No orders of the defined types were placed in this encounter.  Medications Discontinued During This  Encounter  Medication Reason   famotidine (PEPCID) 40 MG tablet    prednisoLONE acetate (PRED FORTE) 1 % ophthalmic suspension     Recommendations:  Troy Sanchez  is a 81 y.o. male  with hypertension, hyperlipidemia, moderate CAD, and mild pulmonary hypertension, chronic dyspnea and also pulmonary scarring noted on CT scan performed in 2019; therefore, dyspnea felt to be related to interstitial lung disease. Also has history of esophageal dilation.   Patient presents for annual follow-up of hypertension, CAD, and bifascicular block.  At last office visit patient was stable from a cardiovascular standpoint and no changes were made.  Patient remains fairly active, particularly for his age.  He is asymptomatic and physical exam as well as EKG are unchanged compared to previous.  Patient's blood pressure is well controlled.  He is followed closely by Dr. Concha Pyo his PCP regarding hyperlipidemia.  Patient does have underlying mild valvular disease, will therefore repeat echocardiogram as this has not been done in 4 years.  Patient is otherwise stable from a cardiovascular standpoint, no changes will be made today.  Follow-up in 1 year, sooner if needed.   Alethia Berthold, PA-C 11/16/2021, 9:01 AM Office: [phone]

## 2021-11-24 ENCOUNTER — Other Ambulatory Visit: Payer: Self-pay

## 2021-11-24 ENCOUNTER — Ambulatory Visit: Payer: Medicare Other

## 2021-11-24 DIAGNOSIS — I351 Nonrheumatic aortic (valve) insufficiency: Secondary | ICD-10-CM

## 2021-11-25 NOTE — Progress Notes (Signed)
Called and spoke with patient regarding his echocardiogram results.  ?

## 2021-11-27 DIAGNOSIS — H04523 Eversion of bilateral lacrimal punctum: Secondary | ICD-10-CM | POA: Diagnosis not present

## 2021-11-27 DIAGNOSIS — H02135 Senile ectropion of left lower eyelid: Secondary | ICD-10-CM | POA: Diagnosis not present

## 2021-11-27 DIAGNOSIS — H02132 Senile ectropion of right lower eyelid: Secondary | ICD-10-CM | POA: Diagnosis not present

## 2021-11-27 DIAGNOSIS — Z01818 Encounter for other preprocedural examination: Secondary | ICD-10-CM | POA: Diagnosis not present

## 2021-12-08 ENCOUNTER — Ambulatory Visit: Payer: Medicare Other | Admitting: Gastroenterology

## 2021-12-08 ENCOUNTER — Encounter: Payer: Self-pay | Admitting: Gastroenterology

## 2021-12-08 VITALS — BP 118/58 | HR 68 | Ht 70.0 in | Wt 206.0 lb

## 2021-12-08 DIAGNOSIS — K2100 Gastro-esophageal reflux disease with esophagitis, without bleeding: Secondary | ICD-10-CM

## 2021-12-08 DIAGNOSIS — K222 Esophageal obstruction: Secondary | ICD-10-CM

## 2021-12-08 MED ORDER — OMEPRAZOLE 40 MG PO CPDR: 40.0000 mg | DELAYED_RELEASE_CAPSULE | Freq: Every day | ORAL | 3 refills | Status: DC

## 2021-12-08 NOTE — Patient Instructions (Signed)
If you are age 80 or older, your body mass index should be between 23-30. Your Body mass index is 29.56 kg/m. If this is out of the aforementioned range listed, please consider follow up with your Primary Care Provider.  If you are age 47 or younger, your body mass index should be between 19-25. Your Body mass index is 29.56 kg/m. If this is out of the aformentioned range listed, please consider follow up with your Primary Care Provider.   ________________________________________________________  The [facility] providers would like to encourage you to use [facility] to communicate with providers for non-urgent requests or questions.  Due to long hold times on the telephone, sending your provider a message by [facility] may be a faster and more efficient way to get a response.  Please allow 48 business hours for a response.  Please remember that this is for non-urgent requests.  _______________________________________________________  Please take the omeprazole 40 mg once a day. A new prescription has been sent to your pharmacy.   It was a pleasure to see you today!  Thank you for trusting me with your gastrointestinal care!

## 2021-12-08 NOTE — Progress Notes (Signed)
[facility] Progress Note  Chief Complaint: GERD and dysphagia  Subjective  History: Seen by our PA in December for longstanding reflux but also worsening dysphagia.  Had previous EGD in 2019. Last year he had been changed from PPI to H2 blocker out of concern that it may have contributed to persistent C. difficile infection.  EGD with me 10/21/2021 showed tortuous distal esophagus as before, new grade C esophagitis with stricture, balloon dilation performed.  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  Patient went on twice daily PPI for 4 weeks then down to once daily indefinitely.  Mr. Viets is glad to report he is much better after the endoscopy.  Having resumed omeprazole, his heartburn has resolved and he is swallowing without difficulty.  Appetite is good and weight stable.  ROS: Cardiovascular:  no chest pain Respiratory: no dyspnea  The patient's Past Medical, Family and Social History were reviewed and are on file in the EMR.  Objective:  Med list reviewed  Current Outpatient Medications:    aspirin EC 81 MG tablet, Take 81 mg by mouth daily. Swallow whole., Disp: , Rfl:    atorvastatin (LIPITOR) 10 MG tablet, Take 10 mg by mouth daily., Disp: , Rfl:    cholecalciferol (VITAMIN D) 1000 UNITS tablet, Take 1,000 Units by mouth every morning. , Disp: , Rfl:    isosorbide mononitrate (IMDUR) 60 MG 24 hr tablet, TAKE 1 TABLET BY MOUTH EVERY DAY, Disp: 90 tablet, Rfl: 3   loratadine (CLARITIN) 10 MG tablet, Take 10 mg by mouth daily., Disp: , Rfl:    magnesium oxide (MAG-OX) 400 MG tablet, Take 400 mg by mouth daily., Disp: , Rfl:    Multiple Vitamin (MULTIVITAMIN WITH MINERALS) TABS tablet, Take 1 tablet by mouth daily., Disp: 30 tablet, Rfl: 0   omeprazole (PRILOSEC) 40 MG capsule, Take 1 capsule (40 mg total) by mouth daily., Disp: 90 capsule, Rfl: 3   Probiotic Product (PROBIOTIC PO),  Take 1 capsule by mouth daily., Disp: , Rfl:   Current Facility-Administered Medications:    0.9 %  sodium chloride infusion, 500 mL, Intravenous, Once, Danis, Estill Cotta III, MD   Vital signs in last 24 hrs: Vitals:   12/08/21 0847  BP: (!) 118/58  Pulse: 68   Wt Readings from Last 3 Encounters:  12/08/21 206 lb (93.4 kg)  11/16/21 205 lb (93 kg)  10/21/21 199 lb (90.3 kg)    Physical Exam  He is well-appearing. No additional exam, entire visit spent in discussion regarding symptoms, result review and plan.  Labs:   ___________________________________________ Radiologic studies:  CLINICAL DATA:  Dysphagia and gastroesophageal reflux disease   EXAM: ESOPHOGRAM / BARIUM SWALLOW / BARIUM TABLET STUDY   TECHNIQUE: Combined double contrast and single contrast examination performed using effervescent crystals, thick barium liquid, and thin barium liquid. The patient was observed with fluoroscopy swallowing a 13 mm barium sulphate tablet.   FLUOROSCOPY TIME:  Fluoroscopy Time:  3 minutes, 12 seconds   Radiation Exposure Index (if provided by the fluoroscopic device): 40.7 mGy   Number of Acquired Spot Images: 0   COMPARISON:  Multiple exams, including CT chest 06/01/2019   FINDINGS: During the pharyngeal phase of swallowing, we note a prominent cricopharyngeus muscle. Just above this there a moderate size Zenker's diverticulum as on image 38 series 2. In addition there is a tiny left lateral diverticulum about this vertical level  as shown on image 59 series 1.   Pouch like appearance along the distal esophagus is thought to probably represent hiatal hernia, or potentially a broad distal esophageal diverticulum. There a subsequent region of smooth narrowing (0.6 cm in diameter, about 1.4 cm in length) between this pouchlike region and the rest of the hiatal hernia for example as shown on image 62 of series 5. This may be due to the concomitant herniation of pancreas  and adipose tissue through the hiatus that was shown on the CT chest of 06/01/2019, potentially causing some mild traction and narrowing along this portion of the hiatal hernia. The patient has had a prior hiatal hernia repair.   Primary peristaltic waves in the esophagus were disrupted in the mid esophagus on 3/4 swallows compatible with nonspecific esophageal dysmotility disorder. There was some secondary contraction in the distal esophagus contributing to clearance, although with a substantial amount of proximal escape. A barium pill was administered. This remain confined to the proximal lobulation of the hiatal hernia/distal esophageal diverticulum contrast pool.   IMPRESSION: 1. Hiatal hernia is present. This is probably bilobed, with smooth narrowing (diameter 0.6 cm) between the two lobulations possibly attributable to concomitant herniation of the pancreatic body and adjacent adipose tissues as shown on CT scans as recently as 06/09/2021, causing extrinsic mass effect. The more proximal lobulation is somewhat featureless in terms of folds, and could conceivably represent a distal esophageal diverticulum rather than necessarily being part of the hiatal hernia. The narrowing between these 2 segments has a maximum diameter of about 0.6 cm, and there was stasis of the barium pill within the upper/proximal component, not traversing this narrowing. Endoscopy may be helpful for categorize in the proximal dilatation is either a component of hiatal hernia versus esophageal diverticulum, and also assessing the smooth narrowing between the 2 segments. 2. 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 Prominent cricopharyngeus muscle. 3. Nonspecific esophageal dysmotility disorder, with disruption of primary peristaltic waves in the mid esophagus on 3/4 swallows. Secondary contraction in the distal esophagus with moderate  proximal escape.     Electronically Signed   By: Van Clines M.D.   On: 09/28/2021 12:42  ____________________________________________ Other:   _____________________________________________ Assessment & Plan  Assessment: Encounter Diagnoses  Name Primary?   Gastroesophageal reflux disease with esophagitis without hemorrhage Yes   Esophageal stricture    Reflux esophagitis requiring long-term once daily PPI, as H2 blocker was insufficient therapy to control symptoms and visible esophagitis with stricture.  Reflux related distal esophageal stricture improved after endoscopic dilation.  Dysphagia has improved, it may persist due to age-related dysmotility seen on imaging as well as tortuous distal esophagus from his hiatal hernia.  Would not recommend hernia surgery at his age if we can control his symptoms medically and endoscopically.   Plan: Omeprazole 40 mg once daily, prescription written with 1 year of refills. See me in a year or sooner as needed.  He was encouraged to contact me if dysphagia recurs, as he would likely need repeat endoscopic dilation in that instance.  22 minutes were spent on this encounter (including chart review, history/exam, counseling/coordination of care, and documentation) > 50% of that time was spent on counseling and coordination of care.   Nelida Meuse III

## 2021-12-16 DIAGNOSIS — H04522 Eversion of left lacrimal punctum: Secondary | ICD-10-CM | POA: Diagnosis not present

## 2021-12-16 DIAGNOSIS — H02132 Senile ectropion of right lower eyelid: Secondary | ICD-10-CM | POA: Diagnosis not present

## 2021-12-16 DIAGNOSIS — H02135 Senile ectropion of left lower eyelid: Secondary | ICD-10-CM | POA: Diagnosis not present

## 2021-12-16 DIAGNOSIS — H04523 Eversion of bilateral lacrimal punctum: Secondary | ICD-10-CM | POA: Diagnosis not present

## 2021-12-16 DIAGNOSIS — H04521 Eversion of right lacrimal punctum: Secondary | ICD-10-CM | POA: Diagnosis not present

## 2021-12-24 DIAGNOSIS — D508 Other iron deficiency anemias: Secondary | ICD-10-CM | POA: Diagnosis not present

## 2021-12-24 DIAGNOSIS — D72819 Decreased white blood cell count, unspecified: Secondary | ICD-10-CM | POA: Diagnosis not present

## 2021-12-24 DIAGNOSIS — R7309 Other abnormal glucose: Secondary | ICD-10-CM | POA: Diagnosis not present

## 2021-12-24 DIAGNOSIS — E785 Hyperlipidemia, unspecified: Secondary | ICD-10-CM | POA: Diagnosis not present

## 2021-12-29 DIAGNOSIS — Z0000 Encounter for general adult medical examination without abnormal findings: Secondary | ICD-10-CM | POA: Diagnosis not present

## 2021-12-29 DIAGNOSIS — K2100 Gastro-esophageal reflux disease with esophagitis, without bleeding: Secondary | ICD-10-CM | POA: Diagnosis not present

## 2021-12-29 DIAGNOSIS — I272 Pulmonary hypertension, unspecified: Secondary | ICD-10-CM | POA: Diagnosis not present

## 2021-12-29 DIAGNOSIS — J849 Interstitial pulmonary disease, unspecified: Secondary | ICD-10-CM | POA: Diagnosis not present

## 2021-12-29 DIAGNOSIS — I25118 Atherosclerotic heart disease of native coronary artery with other forms of angina pectoris: Secondary | ICD-10-CM | POA: Diagnosis not present

## 2021-12-29 DIAGNOSIS — R7303 Prediabetes: Secondary | ICD-10-CM | POA: Diagnosis not present

## 2021-12-29 DIAGNOSIS — Z8619 Personal history of other infectious and parasitic diseases: Secondary | ICD-10-CM | POA: Diagnosis not present

## 2021-12-29 DIAGNOSIS — E785 Hyperlipidemia, unspecified: Secondary | ICD-10-CM | POA: Diagnosis not present

## 2022-02-04 DIAGNOSIS — H02834 Dermatochalasis of left upper eyelid: Secondary | ICD-10-CM | POA: Diagnosis not present

## 2022-02-04 DIAGNOSIS — H02403 Unspecified ptosis of bilateral eyelids: Secondary | ICD-10-CM | POA: Diagnosis not present

## 2022-02-04 DIAGNOSIS — H02055 Trichiasis without entropian left lower eyelid: Secondary | ICD-10-CM | POA: Diagnosis not present

## 2022-02-04 DIAGNOSIS — H02831 Dermatochalasis of right upper eyelid: Secondary | ICD-10-CM | POA: Diagnosis not present

## 2022-03-02 NOTE — Progress Notes (Signed)
Primary Physician:  Merri Brunette, MD   Patient ID: Troy Sanchez, male    DOB: 1941/10/02, 81 y.o.   MRN: [id number]  Subjective:    Chief Complaint  Patient presents with   Hypertension   Follow-up   Shortness of Breath    HPI: Troy Sanchez  is a 81 y.o. male  with hypertension, hyperlipidemia, chronic dyspnea and also pulmonary scarring noted on CT scan performed in 2019; therefore, dyspnea felt to be related to ILD. Also has history of esophageal dilation. He has moderate CAD by coronary angiography in 2017 which revealed moderate disease of 50% and LAD and circumflex coronary artery with normal LVEF and mild pulmonary hypertension.  Echocardiogram in 2018 revealed normal LVEF with mild RV strain, patient was referred to Dr. Sherene Sires, CT scan of the chest revealed changes related to interstitial lung disease as etiology of dyspnea.   Patient was last seen in our office on 11/16/2021 at which time he was stable and advised to follow up in 1 year. Repeat echo following last office visit remained stable. He now presents for urgent visit at his request with concerns of shortness of breath.  Patient states over the last several weeks he has noticed worsening dyspnea on exertion as well as daily episodes of palpitation, also primarily with exertion.  He reports palpitations and dyspnea both improve quickly with rest.  Denies chest pain.  Denies orthopnea, PND, leg edema.  Past Medical History:  Diagnosis Date   CAD (coronary artery disease) 07/02/2019   GERD (gastroesophageal reflux disease) 01/07/2021   Hiatal hernia    HTN (hypertension) 07/02/2019   Hypertension    Recurrent Clostridium difficile diarrhea    Past Surgical History:  Procedure Laterality Date   CARDIAC CATHETERIZATION N/A 05/18/2016   Procedure: Right/Left Heart Cath and Coronary Angiography;  Surgeon: Yates Decamp, MD;  Location: [facility] INVASIVE CV LAB;  Service: Cardiovascular;  Laterality: N/A;   CHOLECYSTECTOMY  2006    GASTROSTOMY N/A 03/06/2014   Procedure: GASTROSTOMY;  Surgeon: Cherylynn Ridges, MD;  Location: [facility];  Service: General;  Laterality: N/A;   HIATAL HERNIA REPAIR N/A 03/06/2014   Procedure: HERNIA REPAIR HIATAL;  Surgeon: Cherylynn Ridges, MD;  Location: [facility] OR;  Service: General;  Laterality: N/A;   LAPAROTOMY N/A 03/06/2014   Procedure: EXPLORATORY LAPAROTOMY;  Surgeon: Cherylynn Ridges, MD;  Location: [facility] OR;  Service: General;  Laterality: N/A;   TRANSURETHRAL RESECTION OF PROSTATE N/A 08/15/2014   Procedure: TRANSURETHRAL RESECTION OF THE PROSTATE WITH GYRUS INSTRUMENTS;  Surgeon: Anner Crete, MD;  Location: [facility] ORS;  Service: Urology;  Laterality: N/A;   UMBILICAL HERNIA REPAIR     Family History  Problem Relation Age of Onset   Colon cancer Neg Hx    Esophageal cancer Neg Hx    Stomach cancer Neg Hx    Pancreatic cancer Neg Hx    Liver disease Neg Hx    Social History   Tobacco Use   Smoking status: Never   Smokeless tobacco: Never  Substance Use Topics   Alcohol use: Yes    Comment: ocassionally   Marital Status: Married  ROS  Review of Systems  Cardiovascular:  Positive for dyspnea on exertion, leg swelling (chronic, minimal) and palpitations. Negative for chest pain, claudication, near-syncope, orthopnea, paroxysmal nocturnal dyspnea and syncope.  Neurological:  Negative for dizziness.     Objective:  Blood pressure 130/79, pulse 61, temperature 97.6 F (36.4 C), temperature source Temporal, resp. rate  17, height $RemoveBe'5\' 10"'LjTFVSYIb$  (1.778 m), weight 209 lb (94.8 kg), SpO2 96 %. Body mass index is 29.99 kg/m.    Physical Exam Vitals reviewed.  Constitutional:      General: He is not in acute distress.    Appearance: He is well-developed.  Cardiovascular:     Rate and Rhythm: Normal rate and regular rhythm.     Pulses: Intact distal pulses.          Carotid pulses are 2+ on the right side and 2+ on the left side.      Radial pulses are 2+ on the right side and 2+ on the left side.        Femoral pulses are 2+ on the right side and 2+ on the left side.      Popliteal pulses are 2+ on the right side and 2+ on the left side.       Dorsalis pedis pulses are 1+ on the right side and 1+ on the left side.       Posterior tibial pulses are 1+ on the right side and 1+ on the left side.     Heart sounds: Normal heart sounds, S1 normal and S2 normal. No murmur heard.   No gallop.     Comments: No JVD Pulmonary:     Effort: Pulmonary effort is normal. No accessory muscle usage or respiratory distress.     Breath sounds: Rales (bilateral bases, left > Right) present. No wheezing or rhonchi.  Musculoskeletal:     Right lower leg: Edema (trace ankle) present.     Left lower leg: Edema (trace ankle) present.   Laboratory examination:      Latest Ref Rng & Units 06/09/2021   12:10 AM 08/21/2020    1:35 PM 08/01/2020    2:38 PM  CMP  Glucose 70 - 99 mg/dL 113   101   102    BUN 8 - 23 mg/dL $Remove'21   12   11    'xODZkmq$ Creatinine 0.61 - 1.24 mg/dL 1.62   0.94   1.04    Sodium 135 - 145 mmol/L 137   139   135    Potassium 3.5 - 5.1 mmol/L 4.8   3.9   3.5    Chloride 98 - 111 mmol/L 104   103   100    CO2 22 - 32 mmol/L $RemoveB'29   27   27    'IWWOHaMI$ Calcium 8.9 - 10.3 mg/dL 9.2   9.2   8.7    Total Protein 6.5 - 8.1 g/dL 6.6   6.3   6.1    Total Bilirubin 0.3 - 1.2 mg/dL 1.3   2.0   1.4    Alkaline Phos 38 - 126 U/L 54   61   65    AST 15 - 41 U/L $Remo'24   23   20    'OaDEx$ ALT 0 - 44 U/L $Remo'14   14   13        'FlEzp$ Latest Ref Rng & Units 06/09/2021   12:10 AM 08/21/2020    1:35 PM 08/01/2020    2:38 PM  CBC  WBC 4.0 - 10.5 K/uL 7.0   9.2   15.2    Hemoglobin 13.0 - 17.0 g/dL 14.2   11.5   10.2    Hematocrit 39.0 - 52.0 % 42.6   37.3   33.0    Platelets 150 - 400 K/uL 143   249   282  Lipid Panel  No results found for: CHOL, TRIG, HDL, CHOLHDL, VLDL, LDLCALC, LDLDIRECT HEMOGLOBIN A1C No results found for: HGBA1C, MPG TSH No results for input(s): TSH in the last 8760 hours.  External Labs:  12/24/2021: A1c  5.7% Total cholesterol 110, HDL 37, LDL 62, triglycerides 57 Hgb 15, HCT 44.7, MCV 85.3, platelet 162 BUN 10, creatinine 1.2, GFR >60, potassium 4.9, sodium 139  11/16/2017: RBC 5.4, hemoglobin 15.6, hematocrit 47.3, CBC otherwise normal. Creatinine 1.1, EGFR 65, potassium 4.6, CMP normal. Hemoglobin A1c 5.3%. Cholesterol 133, HDL 42, triglycerides 89, LDL 73. Vitamin D 37.  Allergies  No Known Allergies   Medications Prior to Visit:   Outpatient Medications Prior to Visit  Medication Sig Dispense Refill   aspirin EC 81 MG tablet Take 81 mg by mouth daily. Swallow whole.     atorvastatin (LIPITOR) 10 MG tablet Take 10 mg by mouth daily.     carboxymethylcellulose (REFRESH PLUS) 0.5 % SOLN 1 drop 3 (three) times daily as needed.     cholecalciferol (VITAMIN D) 1000 UNITS tablet Take 1,000 Units by mouth every morning.      isosorbide mononitrate (IMDUR) 60 MG 24 hr tablet TAKE 1 TABLET BY MOUTH EVERY DAY 90 tablet 3   loratadine (CLARITIN) 10 MG tablet Take 10 mg by mouth daily.     magnesium oxide (MAG-OX) 400 MG tablet Take 400 mg by mouth daily.     Multiple Vitamin (MULTIVITAMIN WITH MINERALS) TABS tablet Take 1 tablet by mouth daily. 30 tablet 0   omeprazole (PRILOSEC) 40 MG capsule Take 1 capsule (40 mg total) by mouth daily. 90 capsule 3   Probiotic Product (PROBIOTIC PO) Take 1 capsule by mouth daily.     Facility-Administered Medications Prior to Visit  Medication Dose Route Frequency Provider Last Rate Last Admin   0.9 %  sodium chloride infusion  500 mL Intravenous Once Nelida Meuse III, MD       Final Medications at End of Visit    Current Meds  Medication Sig   aspirin EC 81 MG tablet Take 81 mg by mouth daily. Swallow whole.   atorvastatin (LIPITOR) 10 MG tablet Take 10 mg by mouth daily.   carboxymethylcellulose (REFRESH PLUS) 0.5 % SOLN 1 drop 3 (three) times daily as needed.   cholecalciferol (VITAMIN D) 1000 UNITS tablet Take 1,000 Units by mouth every morning.     furosemide (LASIX) 20 MG tablet Take 1 tablet (20 mg total) by mouth daily as needed for fluid or edema.   isosorbide mononitrate (IMDUR) 60 MG 24 hr tablet TAKE 1 TABLET BY MOUTH EVERY DAY   loratadine (CLARITIN) 10 MG tablet Take 10 mg by mouth daily.   magnesium oxide (MAG-OX) 400 MG tablet Take 400 mg by mouth daily.   Multiple Vitamin (MULTIVITAMIN WITH MINERALS) TABS tablet Take 1 tablet by mouth daily.   omeprazole (PRILOSEC) 40 MG capsule Take 1 capsule (40 mg total) by mouth daily.   potassium chloride SA (KLOR-CON M20) 20 MEQ tablet Take 1 tablet (20 mEq total) by mouth daily as needed (with Lasix).   Probiotic Product (PROBIOTIC PO) Take 1 capsule by mouth daily.   Current Facility-Administered Medications for the 03/05/22 encounter (Office Visit) with Alethia Berthold, PA-C  Medication   0.9 %  sodium chloride infusion   Radiology   No results found.  High-resolution CT chest 06/01/2019: 1. There is a redemonstrated pattern of mild pulmonary fibrosis with an apical to basal gradient featuring irregular  peripheral interstitial opacity and minimal associated ground-glass. Mild, predominantly tubular bronchiectasis. No significant air trapping on expiratory phase imaging. Findings are not significantly changed compared to prior examinations dating back to 04/21/2016 and remain consistent with an "indeterminate for UIP" pattern by ATS pulmonary fibrosis criteria. Per ordering indication, patient has an established diagnosis of IPF.  2.  Coronary artery disease  High-resolution CT scan 01/20/2018: Aortic and coronary atherosclerosis. Patchy peripheral predominant areas of groundglass attenuation, septal thickening and mild subpleural reticulation. Scattered areas of cylindrical bronchiectasis and peripheral bronchiectasis. No significant progression compared to 04/21/2016. Appearance of the lungs is compatible with interstitial lung disease.  Cardiac Studies:  Echocardiogram  11/24/2021:  Left ventricle cavity is normal in size and wall thickness. Normal global  wall motion. Normal LV systolic function with EF 64%. Doppler evidence of  grade I (impaired) diastolic dysfunction, normal LAP.  Structurally normal trileaflet aortic valve. Mild (Grade I) aortic  regurgitation.  Mild tricuspid regurgitation.  The IVC is not seen.  Compared to previous study in 2019, reported LA/RV dilatation not appreciated on this study.   Echocardiogram 10/31/2017: Poor echo window. Wall motion abnormality has reduced sensitivity. Left ventricle cavity is normal in size. LV systolic function appears to be low normal with borderline global hypokinesis. Visual EF is 50-55%. Normal diastolic filling pattern. Left atrial cavity is mildly dilated at 4.1 cm. Right ventricle cavity is mildly dilated. Normal right ventricular function. A moderateor band is noted at the RV apex (normal variant). Trileaflet aortic valve with mild (Grade I) regurgitation. Trace tricuspid regurgitation. Unable to estimate PA pressure due to absence/minimal TR signal. Compared to the study done on 04/28/2016, no significant change.  Heart Cath 05/18/2016: Mild pulmonary hypertension PA 37/15, mean 25 mmHg. Wedge low at 10 mm Hg. CI decreased. Moderate CAD, Mid LAD 50% and Mid to dist Cx 50%. 30% Mid RCA. LVEF 50-55%.   EKG:   11/16/2021: Sinus rhythm at a rate of 61 bpm.  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  Low voltage complexes, consider pulmonary disease pattern.  Compared EKG 11/14/2020, no significant change.  Assessment:     ICD-10-CM   1. Shortness of breath  H96.22 Basic metabolic panel    Brain natriuretic peptide    PCV MYOCARDIAL PERFUSION WO LEXISCAN    LONG TERM MONITOR (3-14 DAYS)    DG Chest 2 View    2. Coronary artery disease involving native coronary artery of native heart without angina pectoris  W97.98 Basic metabolic panel    Brain  natriuretic peptide    PCV MYOCARDIAL PERFUSION WO LEXISCAN    LONG TERM MONITOR (3-14 DAYS)    DG Chest 2 View      Meds ordered this encounter  Medications   furosemide (LASIX) 20 MG tablet    Sig: Take 1 tablet (20 mg total) by mouth daily as needed for fluid or edema.    Dispense:  30 tablet    Refill:  3   potassium chloride SA (KLOR-CON M20) 20 MEQ tablet    Sig: Take 1 tablet (20 mEq total) by mouth daily as needed (with Lasix).    Dispense:  30 tablet    Refill:  3   There are no discontinued medications.   Recommendations:  Troy Sanchez  is a 81 y.o. male  with hypertension, hyperlipidemia, moderate CAD, and mild pulmonary hypertension, chronic dyspnea and also pulmonary scarring noted on CT scan performed in 2019; therefore,  dyspnea felt to be related to interstitial lung disease. Also has history of esophageal dilation.   Patient was last seen in our office on 11/16/2021 at which time he was stable and advised to follow up in 1 year. Repeat echo following last office visit remained stable. He now presents for urgent visit at his request with concerns of shortness of breath.  Given patient's worsening dyspnea on exertion and known history of moderate CAD will obtain nuclear stress test, also to evaluate palpitations which patient states occur with exertional activity.  To further investigate palpitations will obtain cardiac monitor following stress test as well.  As patient's weight has trended up since last office visit suspect dyspnea may be related to volume overload.  Patient notably is chronic rales on exam, therefore difficult to evaluate if this is worse.  Will obtain chest x-ray, BMP, and BNP.  Patient will take Lasix 20 mg p.o. daily for 3 days with potassium supplements accordingly.  Patient's physical exam stable.  Advised patient that he should also reach out to pulmonology who follows him closely given concerns of dyspnea on exertion.  Follow-up in 4 to 6 weeks,  sooner if needed.   Alethia Berthold, PA-C 03/05/2022, 9:32 AM Office: [phone]

## 2022-03-05 ENCOUNTER — Ambulatory Visit: Payer: Medicare Other | Admitting: Student

## 2022-03-05 ENCOUNTER — Encounter: Payer: Self-pay | Admitting: Student

## 2022-03-05 ENCOUNTER — Ambulatory Visit
Admission: RE | Admit: 2022-03-05 | Discharge: 2022-03-05 | Disposition: A | Discharge status: Home / Self Care | Payer: Medicare Other | Source: Ambulatory Visit | Attending: Student | Admitting: Student

## 2022-03-05 VITALS — BP 130/79 | HR 61 | Temp 97.6°F | Resp 17 | Ht 70.0 in | Wt 209.0 lb

## 2022-03-05 DIAGNOSIS — R0602 Shortness of breath: Secondary | ICD-10-CM

## 2022-03-05 DIAGNOSIS — I2510 Atherosclerotic heart disease of native coronary artery without angina pectoris: Secondary | ICD-10-CM

## 2022-03-05 DIAGNOSIS — R0600 Dyspnea, unspecified: Secondary | ICD-10-CM | POA: Diagnosis not present

## 2022-03-05 MED ORDER — POTASSIUM CHLORIDE CRYS ER 20 MEQ PO TBCR: 20.0000 meq | EXTENDED_RELEASE_TABLET | Freq: Every day | ORAL | 3 refills | Status: DC | PRN

## 2022-03-05 MED ORDER — FUROSEMIDE 20 MG PO TABS: 20.0000 mg | ORAL_TABLET | Freq: Every day | ORAL | 3 refills | Status: DC | PRN

## 2022-03-06 LAB — BASIC METABOLIC PANEL
BUN/Creatinine Ratio: 9 — ABNORMAL LOW (ref 10–24)
BUN: 11 mg/dL (ref 8–27)
CO2: 24 mmol/L (ref 20–29)
Calcium: 9.5 mg/dL (ref 8.6–10.2)
Chloride: 99 mmol/L (ref 96–106)
Creatinine, Ser: 1.29 mg/dL — ABNORMAL HIGH (ref 0.76–1.27)
Glucose: 110 mg/dL — ABNORMAL HIGH (ref 70–99)
Potassium: 4.9 mmol/L (ref 3.5–5.2)
Sodium: 137 mmol/L (ref 134–144)
eGFR: 56 mL/min/{1.73_m2} — ABNORMAL LOW (ref >59)

## 2022-03-06 LAB — BRAIN NATRIURETIC PEPTIDE: BNP: 17.1 pg/mL (ref 0.0–100.0)

## 2022-03-09 NOTE — Progress Notes (Signed)
Called and spoke to pt, pt voiced understanding.

## 2022-03-15 ENCOUNTER — Ambulatory Visit: Payer: Medicare Other

## 2022-03-15 ENCOUNTER — Inpatient Hospital Stay: Payer: Medicare Other

## 2022-03-15 DIAGNOSIS — I2510 Atherosclerotic heart disease of native coronary artery without angina pectoris: Secondary | ICD-10-CM | POA: Diagnosis not present

## 2022-03-15 DIAGNOSIS — R0602 Shortness of breath: Secondary | ICD-10-CM | POA: Diagnosis not present

## 2022-03-16 LAB — PCV MYOCARDIAL PERFUSION WO LEXISCAN
Base ST Depression (mm): 0 mm
ST Depression (mm): 0 mm

## 2022-03-17 DIAGNOSIS — Z87442 Personal history of urinary calculi: Secondary | ICD-10-CM | POA: Diagnosis not present

## 2022-03-18 NOTE — Progress Notes (Signed)
Called and spoke to patient, he is aware of test results.

## 2022-03-19 ENCOUNTER — Encounter: Payer: Self-pay | Admitting: *Deleted

## 2022-03-19 ENCOUNTER — Ambulatory Visit: Payer: Medicare Other | Admitting: Cardiology

## 2022-03-23 DIAGNOSIS — I2510 Atherosclerotic heart disease of native coronary artery without angina pectoris: Secondary | ICD-10-CM | POA: Diagnosis not present

## 2022-03-23 DIAGNOSIS — R0602 Shortness of breath: Secondary | ICD-10-CM | POA: Diagnosis not present

## 2022-03-24 NOTE — Progress Notes (Signed)
03/26/2022 JESSICA SEIDMAN [id number] [birth year]  Referring provider: Deland Pretty, MD Primary GI doctor: Dr. 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 No GERd but some LPR symptoms Lifestyle changes discussed, avoid NSAIDS, no ETOH Discussed options with patient, since it is more intermittent, will increase PPI to BID and if not better or becomes consistent will plan for repeat EGD, patient agrees with plan.  If becomes more consistent will call back to schedule EGD sooner -     omeprazole (PRILOSEC) 40 MG capsule; Take 1 capsule (40 mg total) by mouth 2 (two) times daily before a meal.  Recurrent Clostridioides difficile diarrhea Discussed if he get diarrhea while on PPI BID call the office.     History of Present Illness:  81 y.o. male  with a past medical history of CAD on aspirin (07/03/2020 echo with an LVEF 65-70%), GERD, history of Cdfiff  and others listed below, returns to clinic today for evaluation of dysphagia.  08/08/2018 EGD with LA grade B reflux esophagitis, tortuous esophagus and otherwise normal.  Dysphagia is appeared to be a combination of lower esophageal anatomy and probable reflux related dysmotility.   09/21/2021 office visit with PA for worsening reflux and dysphagia 09/28/2021 barium hiatal hernia, bilobed smooth narrowing between 2 lobulations possibly representing herniation of pancreatic body and adipose tissue, causing intrinsic mass effect.  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 10/21/2021 endoscopy with Dr. Loletha Carrow torturous distal esophagus, new grade C esophagitis with stricture status post balloon dilatation difficult to assess fundoplication given herniation of that area above the diaphragm Given PPI twice daily for 4 weeks then decrease  down to once daily indefinitely.  Patient states the EGD did help with his dysphagia but then the last 2-3 weeks, he has had intermittent dysphagia solid foods like chicken.  In the past 2-3 weeks has only happened 2-3 times.  State one time was after having a glass of wine for his 61th anniversary.  Denies reflux, has hoarseness, clearing of this throat.  Denies nausea, vomiting. Has gained weight.  He is still on the prilosec/omeprazole once a day.   Current Medications:     Current Outpatient Medications (Cardiovascular):    atorvastatin (LIPITOR) 10 MG tablet, Take 10 mg by mouth daily.   isosorbide mononitrate (IMDUR) 60 MG 24 hr tablet, TAKE 1 TABLET BY MOUTH EVERY DAY   Current Outpatient Medications (Respiratory):    loratadine (CLARITIN) 10 MG tablet, Take 10 mg by mouth daily.   Current Outpatient Medications (Analgesics):    aspirin EC 81 MG tablet, Take 81 mg by mouth daily. Swallow whole.     Current Outpatient Medications (Other):    carboxymethylcellulose (REFRESH PLUS) 0.5 % SOLN, 1 drop 3 (three) times daily as needed.   cholecalciferol (VITAMIN D) 1000 UNITS tablet, Take 1,000 Units by mouth every morning.    magnesium oxide (MAG-OX) 400 MG tablet, Take 400 mg by mouth daily.   Multiple Vitamin (MULTIVITAMIN WITH MINERALS) TABS tablet, Take 1 tablet by mouth daily.   potassium chloride SA (KLOR-CON M20) 20 MEQ tablet, Take 1 tablet (20 mEq total) by mouth daily as needed (with Lasix).   Probiotic Product (PROBIOTIC PO), Take 1 capsule by mouth daily.   omeprazole (PRILOSEC) 40 MG capsule, Take 1 capsule (40 mg total) by mouth 2 (two) times  daily before a meal.  Current Facility-Administered Medications (Other):    0.9 %  sodium chloride infusion  Surgical History:  He  has a past surgical history that includes Cholecystectomy (2006); Umbilical hernia repair; laparotomy (N/A, 03/06/2014); Hiatal hernia repair (N/A, 03/06/2014); Gastrostomy (N/A, 03/06/2014);  Transurethral resection of prostate (N/A, 08/15/2014); and Cardiac catheterization (N/A, 05/18/2016). Family History:  His family history is not on file. Social History:   reports that he has never smoked. He has never used smokeless tobacco. He reports current alcohol use. He reports that he does not use drugs.  Current Medications, Allergies, Past Medical History, Past Surgical History, Family History and Social History were reviewed in [employer] record.  Physical Exam: BP 102/70   Pulse 84   Ht '5\' 10"'$  (1.778 m)   Wt 209 lb (94.8 kg)   BMI 29.99 kg/m  General:   Pleasant, well developed male in no acute distress Heart : Regular rate and rhythm; no murmurs Pulm: Clear anteriorly; no wheezing Abdomen:  Soft, Obese AB, Active bowel sounds. No tenderness . Without guarding and Without rebound, No organomegaly appreciated. Rectal: Not evaluated Extremities:  without  edema. Neurologic:  Alert and  oriented x4;  No focal deficits.  Psych:  Cooperative. Normal mood and affect.   Vladimir Crofts, PA-C 03/26/22

## 2022-03-26 ENCOUNTER — Encounter: Payer: Self-pay | Admitting: Physician Assistant

## 2022-03-26 ENCOUNTER — Ambulatory Visit: Payer: Medicare Other | Admitting: Physician Assistant

## 2022-03-26 VITALS — BP 102/70 | HR 84 | Ht 70.0 in | Wt 209.0 lb

## 2022-03-26 DIAGNOSIS — A0471 Enterocolitis due to Clostridium difficile, recurrent: Secondary | ICD-10-CM

## 2022-03-26 DIAGNOSIS — H1033 Unspecified acute conjunctivitis, bilateral: Secondary | ICD-10-CM | POA: Diagnosis not present

## 2022-03-26 DIAGNOSIS — R1310 Dysphagia, unspecified: Secondary | ICD-10-CM | POA: Diagnosis not present

## 2022-03-26 DIAGNOSIS — K449 Diaphragmatic hernia without obstruction or gangrene: Secondary | ICD-10-CM

## 2022-03-26 DIAGNOSIS — K222 Esophageal obstruction: Secondary | ICD-10-CM

## 2022-03-26 MED ORDER — OMEPRAZOLE 40 MG PO CPDR: 40.0000 mg | DELAYED_RELEASE_CAPSULE | Freq: Two times a day (BID) | ORAL | 0 refills | Status: DC

## 2022-03-26 NOTE — Patient Instructions (Addendum)
Please follow up with Dr Loletha Carrow on 06/01/22 at 2:00 pm. Call our office sooner if needed. ____________________________________________________  Please take your proton pump inhibitor medication, prilosec 40 mg TWICE a day for at least 2 months , 30 minutes to 1 hour before meals- this makes it more effective.  IF YOU GET DIARRHEA LET us KNOW Avoid spicy and acidic foods Avoid fatty foods Limit your intake of coffee, tea, alcohol, and carbonated drinks Work to maintain a healthy weight Keep the head of the bed elevated at least 3 inches with blocks or a wedge pillow if you are having any nighttime symptoms Stay upright for 2 hours after eating Avoid meals and snacks three to four hours before bedtime ______________________________________________________  If you are age 74 or older, your body mass index should be between 23-30. Your Body mass index is 29.99 kg/m. If this is out of the aforementioned range listed, please consider follow up with your Primary Care Provider.  If you are age 22 or younger, your body mass index should be between 19-25. Your Body mass index is 29.99 kg/m. If this is out of the aformentioned range listed, please consider follow up with your Primary Care Provider.   ________________________________________________________  The [facility] providers would like to encourage you to use [facility] to communicate with providers for non-urgent requests or questions.  Due to long hold times on the telephone, sending your provider a message by [facility] may be a faster and more efficient way to get a response.  Please allow 48 business hours for a response.  Please remember that this is for non-urgent requests.  _______________________________________________________ Due to recent changes in healthcare laws, you may see the results of your imaging and laboratory studies on MyChart before your provider has had a chance to review them.  We understand that in some cases there may be  results that are confusing or concerning to you. Not all laboratory results come back in the same time frame and the provider may be waiting for multiple results in order to interpret others.  Please give [place] 48 hours in order for your provider to thoroughly review all the results before contacting the office for clarification of your results.  _______________________________________________________ Hiatal Hernia  A hiatal hernia occurs when part of the stomach slides above the muscle that separates the abdomen from the chest (diaphragm). A person can be born with a hiatal hernia (congenital), or it may develop over time. In almost all cases of hiatal hernia, only the top part of the stomach pushes through the diaphragm. Many people have a hiatal hernia with no symptoms. The larger the hernia, the more likely it is that you will have symptoms. In some cases, a hiatal hernia allows stomach acid to flow back into the tube that carries food from your mouth to your stomach (esophagus). This may cause heartburn symptoms. Severe heartburn symptoms may mean that you have developed a condition called gastroesophageal reflux disease (GERD). What are the causes? This condition is caused by a weakness in the opening (hiatus) where the esophagus passes through the diaphragm to attach to the upper part of the stomach. A person may be born with a weakness in the hiatus, or a weakness can develop over time. What increases the risk? This condition is more likely to develop in: Older people. Age is a major risk factor for a hiatal hernia, especially if you are over the age of 60. Pregnant women. People who are overweight. People who have frequent  constipation. What are the signs or symptoms? Symptoms of this condition usually develop in the form of GERD symptoms. Symptoms include: Heartburn. Belching. Indigestion. Trouble swallowing. Coughing or wheezing. Sore throat. Hoarseness. Chest pain. Nausea and  vomiting. How is this diagnosed? This condition may be diagnosed during testing for GERD. Tests that may be done include: X-rays of your stomach or chest. An upper gastrointestinal (GI) series. This is an X-ray exam of your GI tract that is taken after you swallow a chalky liquid that shows up clearly on the X-ray. Endoscopy. This is a procedure to look into your stomach using a thin, flexible tube that has a tiny camera and light on the end of it. How is this treated? This condition may be treated by: Dietary and lifestyle changes to help reduce GERD symptoms. Medicines. These may include: Over-the-counter antacids. Medicines that make your stomach empty more quickly. Medicines that block the production of stomach acid (H2 blockers). Stronger medicines to reduce stomach acid (proton pump inhibitors). Surgery to repair the hernia, if other treatments are not helping. If you have no symptoms, you may not need treatment. Follow these instructions at home: Lifestyle and activity Do not use any products that contain nicotine or tobacco, such as cigarettes and e-cigarettes. If you need help quitting, ask your health care provider. Try to achieve and maintain a healthy body weight. Avoid putting pressure on your abdomen. Anything that puts pressure on your abdomen increases the amount of acid that may be pushed up into your esophagus. Avoid bending over, especially after eating. Raise the head of your bed by putting blocks under the legs. This keeps your head and esophagus higher than your stomach. Do not wear tight clothing around your chest or stomach. Try not to strain when having a bowel movement, when urinating, or when lifting heavy objects. Eating and drinking Avoid foods that can worsen GERD symptoms. These may include: Fatty foods, like fried foods. Citrus fruits, like oranges or lemon. Other foods and drinks that contain acid, like orange juice or tomatoes. Spicy  food. Chocolate. Eat frequent small meals instead of three large meals a day. This helps prevent your stomach from getting too full. Eat slowly. Do not lie down right after eating. Do not eat 1-2 hours before bed. Do not drink beverages with caffeine. These include cola, coffee, cocoa, and tea. Do not drink alcohol. General instructions Take over-the-counter and prescription medicines only as told by your health care provider. Keep all follow-up visits as told by your health care provider. This is important. Contact a health care provider if: Your symptoms are not controlled with medicines or lifestyle changes. You are having trouble swallowing. You have coughing or wheezing that will not go away. Get help right away if: Your pain is getting worse. Your pain spreads to your arms, neck, jaw, teeth, or back. You have shortness of breath. You sweat for no reason. You feel sick to your stomach (nauseous) or you vomit. You vomit blood. You have bright red blood in your stools. You have black, tarry stools. Summary A hiatal hernia occurs when part of the stomach slides above the muscle that separates the abdomen from the chest (diaphragm). A person may be born with a weakness in the hiatus, or a weakness can develop over time. Symptoms of hiatal hernia may include heartburn, trouble swallowing, or sore throat. Management of hiatal hernia includes eating frequent small meals instead of three large meals a day. Get help right away if you vomit  blood, have bright red blood in your stools, or have black, tarry stools. This information is not intended to replace advice given to you by your health care provider. Make sure you discuss any questions you have with your health care provider. Document Revised: 08/11/2021 Document Reviewed: 08/28/2020 Elsevier Patient Education  [place].

## 2022-03-29 ENCOUNTER — Other Ambulatory Visit: Payer: Medicare Other

## 2022-03-29 NOTE — Progress Notes (Signed)
____________________________________________________________  Attending physician addendum:  Thank you for sending this case to me and for discussing it with me in clinic the day he was seen. I have reviewed the entire note and agree with the plan as we discussed.  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  However, as he prefers to try an increased dose of medicine and assess symptoms, we will wait to hear from him.  Wilfrid Lund, MD  ____________________________________________________________

## 2022-03-30 DIAGNOSIS — R0602 Shortness of breath: Secondary | ICD-10-CM | POA: Diagnosis not present

## 2022-03-30 DIAGNOSIS — I2510 Atherosclerotic heart disease of native coronary artery without angina pectoris: Secondary | ICD-10-CM | POA: Diagnosis not present

## 2022-03-30 NOTE — Progress Notes (Signed)
Called and spoke with patient regarding his monitor results.

## 2022-04-15 NOTE — Progress Notes (Signed)
Primary Physician:  Deland Pretty, MD   Patient ID: Troy Sanchez, male    DOB: 1940-10-16, 81 y.o.   MRN: [id number]  Subjective:    Chief Complaint  Patient presents with   Shortness of Breath   Hypertension   Follow-up    6 week   Results    Stress test    HPI: Troy Sanchez  is a 81 y.o. male  with hypertension, hyperlipidemia, chronic dyspnea and also pulmonary scarring noted on CT scan performed in 2019; therefore, dyspnea felt to be related to ILD. Also has history of esophageal dilation. He has moderate CAD by coronary angiography in 2017 which revealed moderate disease of 50% and LAD and circumflex coronary artery with normal LVEF and mild pulmonary hypertension.  Echocardiogram in 2018 revealed normal LVEF with mild RV strain, patient was referred to Dr. Melvyn Novas, CT scan of the chest revealed changes related to interstitial lung disease as etiology of dyspnea.   Patient was last seen in the office 03/05/2022 at which time due to worsening dyspnea on exertion ordered stress test and cardiac monitor and given weight gain advised patient to obtain labs and chest x-ray as well as take Lasix 20 mg p.o. daily for 3 days.  Stress test was overall low risk.  Monitor revealed no significant cardiac arrhythmias.  BMP was stable and BNP normal.  He now presents for 6-week follow-up.  Patient reports continued stable dyspnea on exertion.  He has had no recurrence of palpitations.  Past Medical History:  Diagnosis Date   CAD (coronary artery disease) 07/02/2019   Esophageal motility disorder    Esophageal stenosis    "torturous esophagus"   GERD (gastroesophageal reflux disease) 01/07/2021   Hiatal hernia    HTN (hypertension) 07/02/2019   Hypertension    Recurrent Clostridium difficile diarrhea    Zenker diverticulum    Past Surgical History:  Procedure Laterality Date   CARDIAC CATHETERIZATION N/A 05/18/2016   Procedure: Right/Left Heart Cath and Coronary Angiography;  Surgeon:  Adrian Prows, MD;  Location: [facility];  Service: Cardiovascular;  Laterality: N/A;   CHOLECYSTECTOMY  2006   GASTROSTOMY N/A 03/06/2014   Procedure: GASTROSTOMY;  Surgeon: [name], MD;  Location: [facility];  Service: General;  Laterality: N/A;   HIATAL HERNIA REPAIR N/A 03/06/2014   Procedure: HERNIA REPAIR HIATAL;  Surgeon: [name], MD;  Location: [facility];  Service: General;  Laterality: N/A;   LAPAROTOMY N/A 03/06/2014   Procedure: EXPLORATORY LAPAROTOMY;  Surgeon: [name], MD;  Location: [facility];  Service: General;  Laterality: N/A;   TRANSURETHRAL RESECTION OF PROSTATE N/A 08/15/2014   Procedure: TRANSURETHRAL RESECTION OF THE PROSTATE WITH GYRUS INSTRUMENTS;  Surgeon: Malka So, MD;  Location: [facility];  Service: Urology;  Laterality: N/A;   UMBILICAL HERNIA REPAIR     Family History  Problem Relation Age of Onset   Colon cancer Neg Hx    Esophageal cancer Neg Hx    Stomach cancer Neg Hx    Pancreatic cancer Neg Hx    Liver disease Neg Hx    Social History   Tobacco Use   Smoking status: Never   Smokeless tobacco: Never  Substance Use Topics   Alcohol use: Yes    Comment: ocassionally   Marital Status: Married  ROS  Review of Systems  Cardiovascular:  Positive for dyspnea on exertion (stable) and leg swelling (chronic, minimal). Negative for chest pain, claudication, near-syncope, orthopnea, palpitations,  paroxysmal nocturnal dyspnea and syncope.  Neurological:  Negative for dizziness.      Objective:  Blood pressure 130/79, pulse (!) 55, temperature 97.8 F (36.6 C), resp. rate 16, height _0  (1.778 m), weight 210 lb (95.3 kg), SpO2 96 %. Body mass index is 30.13 kg/m.    Physical Exam Vitals reviewed.  Constitutional:      General: He is not in acute distress.    Appearance: He is well-developed.  Cardiovascular:     Rate and Rhythm: Normal rate and regular rhythm.     Pulses: Intact distal pulses.          Carotid pulses are 2+ on the right  side and 2+ on the left side.      Radial pulses are 2+ on the right side and 2+ on the left side.       Femoral pulses are 2+ on the right side and 2+ on the left side.      Popliteal pulses are 2+ on the right side and 2+ on the left side.       Dorsalis pedis pulses are 1+ on the right side and 1+ on the left side.       Posterior tibial pulses are 1+ on the right side and 1+ on the left side.     Heart sounds: Normal heart sounds, S1 normal and S2 normal. No murmur heard.    No gallop.     Comments: No JVD Pulmonary:     Effort: Pulmonary effort is normal. No accessory muscle usage or respiratory distress.     Breath sounds: Rales (bilateral bases, left > Right) present. No wheezing or rhonchi.  Musculoskeletal:     Right lower leg: Edema (trace ankle) present.     Left lower leg: Edema (trace ankle) present.   Physical exam unchanged compared to previous office visit.   Laboratory examination:      Latest Ref Rng & Units 03/05/2022    9:46 AM 06/09/2021   12:10 AM 08/21/2020    1:35 PM  CMP  Glucose 70 - 99 mg/dL 110  113  101   BUN 8 - 27 mg/dL _1 Creatinine 0.76 - 1.27 mg/dL 1.29  1.62  0.94   Sodium 134 - 144 mmol/L 137  137  139   Potassium 3.5 - 5.2 mmol/L 4.9  4.8  3.9   Chloride 96 - 106 mmol/L 99  104  103   CO2 20 - 29 mmol/L _2 Calcium 8.6 - 10.2 mg/dL 9.5  9.2  9.2   Total Protein 6.5 - 8.1 g/dL  6.6  6.3   Total Bilirubin 0.3 - 1.2 mg/dL  1.3  2.0   Alkaline Phos 38 - 126 U/L  54  61   AST 15 - 41 U/L  24  23   ALT 0 - 44 U/L  14  14       Latest Ref Rng & Units 06/09/2021   12:10 AM 08/21/2020    1:35 PM 08/01/2020    2:38 PM  CBC  WBC 4.0 - 10.5 K/uL 7.0  9.2  15.2   Hemoglobin 13.0 - 17.0 g/dL 14.2  11.5  10.2   Hematocrit 39.0 - 52.0 % 42.6  37.3  33.0   Platelets 150 - 400 K/uL 143  249  282    Lipid Panel  No results found for: "CHOL", "TRIG", "HDL", "CHOLHDL", "VLDL", "  [place]", "LDLDIRECT" HEMOGLOBIN A1C No results found  for: "HGBA1C", "MPG" TSH No results for input(s): "TSH" in the last 8760 hours.  BNP    Component Value Date/Time   BNP 17.1 03/05/2022 0946    ProBNP No results found for: "PROBNP"  External Labs:  12/24/2021: A1c 5.7% Total cholesterol 110, HDL 37, LDL 62, triglycerides 57 Hgb 15, HCT 44.7, MCV 85.3, platelet 162 BUN 10, creatinine 1.2, GFR >60, potassium 4.9, sodium 139  11/16/2017: RBC 5.4, hemoglobin 15.6, hematocrit 47.3, CBC otherwise normal. Creatinine 1.1, EGFR 65, potassium 4.6, CMP normal. Hemoglobin A1c 5.3%. Cholesterol 133, HDL 42, triglycerides 89, LDL 73. Vitamin D 37.  Allergies  No Known Allergies   Medications Prior to Visit:   Outpatient Medications Prior to Visit  Medication Sig Dispense Refill   aspirin EC 81 MG tablet Take 81 mg by mouth daily. Swallow whole.     atorvastatin (LIPITOR) 10 MG tablet Take 10 mg by mouth daily.     carboxymethylcellulose (REFRESH PLUS) 0.5 % SOLN 1 drop 3 (three) times daily as needed.     cholecalciferol (VITAMIN D) 1000 UNITS tablet Take 1,000 Units by mouth every morning.      isosorbide mononitrate (IMDUR) 60 MG 24 hr tablet TAKE 1 TABLET BY MOUTH EVERY DAY 90 tablet 3   loratadine (CLARITIN) 10 MG tablet Take 10 mg by mouth daily.     magnesium oxide (MAG-OX) 400 MG tablet Take 400 mg by mouth daily.     Multiple Vitamin (MULTIVITAMIN WITH MINERALS) TABS tablet Take 1 tablet by mouth daily. 30 tablet 0   omeprazole (PRILOSEC) 40 MG capsule Take 1 capsule (40 mg total) by mouth 2 (two) times daily before a meal. 180 capsule 0   Probiotic Product (PROBIOTIC PO) Take 1 capsule by mouth daily.     potassium chloride SA (KLOR-CON M20) 20 MEQ tablet Take 1 tablet (20 mEq total) by mouth daily as needed (with Lasix). 30 tablet 3   Facility-Administered Medications Prior to Visit  Medication Dose Route Frequency Provider Last Rate Last Admin   0.9 %  sodium chloride infusion  500 mL Intravenous Once Nelida Meuse III, MD        Final Medications at End of Visit    Current Meds  Medication Sig   aspirin EC 81 MG tablet Take 81 mg by mouth daily. Swallow whole.   atorvastatin (LIPITOR) 10 MG tablet Take 10 mg by mouth daily.   carboxymethylcellulose (REFRESH PLUS) 0.5 % SOLN 1 drop 3 (three) times daily as needed.   cholecalciferol (VITAMIN D) 1000 UNITS tablet Take 1,000 Units by mouth every morning.    isosorbide mononitrate (IMDUR) 60 MG 24 hr tablet TAKE 1 TABLET BY MOUTH EVERY DAY   loratadine (CLARITIN) 10 MG tablet Take 10 mg by mouth daily.   magnesium oxide (MAG-OX) 400 MG tablet Take 400 mg by mouth daily.   Multiple Vitamin (MULTIVITAMIN WITH MINERALS) TABS tablet Take 1 tablet by mouth daily.   omeprazole (PRILOSEC) 40 MG capsule Take 1 capsule (40 mg total) by mouth 2 (two) times daily before a meal.   Probiotic Product (PROBIOTIC PO) Take 1 capsule by mouth daily.   Current Facility-Administered Medications for the 04/16/22 encounter (Office Visit) with Alethia Berthold, PA-C  Medication   0.9 %  sodium chloride infusion   Radiology   No results found.  High-resolution CT chest 06/01/2019: 1. There is a redemonstrated pattern of mild pulmonary fibrosis with an apical to  basal gradient featuring irregular peripheral interstitial opacity and minimal associated ground-glass. Mild, predominantly tubular bronchiectasis. No significant air trapping on expiratory phase imaging. Findings are not significantly changed compared to prior examinations dating back to 04/21/2016 and remain consistent with an "indeterminate for UIP" pattern by ATS pulmonary fibrosis criteria. Per ordering indication, patient has an established diagnosis of IPF.  2.  Coronary artery disease  High-resolution CT scan 01/20/2018: Aortic and coronary atherosclerosis. Patchy peripheral predominant areas of groundglass attenuation, septal thickening and mild subpleural reticulation. Scattered areas of cylindrical bronchiectasis and  peripheral bronchiectasis. No significant progression compared to 04/21/2016. Appearance of the lungs is compatible with interstitial lung disease.  Cardiac Studies:  Zio patch cardiac monitor 3 days (03/15/2022 - 03/18/2022): Predominant underlying rhythm was sinus with BBB present. Min HR of 48 bpm, max HR of 94 bpm, and avg HR of 62 bpm.  Rare PACs and rare PVCs, ventricular bigeminy was present.  There were no patient triggered events.  No evidence of significant cardiac arrhythmias.  Lexiscan (with Mod Bruce protocol) Nuclear stress test 03/15/2022: Nondiagnostic ECG stress. The heart rate response was consistent with Regadenoson.  Myocardial perfusion reveals diaphragmatic attenuation. Minimal ischemia in this region cannot be excluded. Overall LV systolic function is normal without regional wall motion abnormalities. Stress LV EF: 57%.  No previous exam available for comparison. Low risk.   Echocardiogram 11/24/2021:  Left ventricle cavity is normal in size and wall thickness. Normal global  wall motion. Normal LV systolic function with EF 64%. Doppler evidence of  grade I (impaired) diastolic dysfunction, normal LAP.  Structurally normal trileaflet aortic valve. Mild (Grade I) aortic  regurgitation.  Mild tricuspid regurgitation.  The IVC is not seen.  Compared to previous study in 2019, reported LA/RV dilatation not appreciated on this study.   Echocardiogram 10/31/2017: Poor echo window. Wall motion abnormality has reduced sensitivity. Left ventricle cavity is normal in size. LV systolic function appears to be low normal with borderline global hypokinesis. Visual EF is 50-55%. Normal diastolic filling pattern. Left atrial cavity is mildly dilated at 4.1 cm. Right ventricle cavity is mildly dilated. Normal right ventricular function. A moderateor band is noted at the RV apex (normal variant). Trileaflet aortic valve with mild (Grade I) regurgitation. Trace tricuspid regurgitation.  Unable to estimate PA pressure due to absence/minimal TR signal. Compared to the study done on 04/28/2016, no significant change.  Heart Cath 05/18/2016: Mild pulmonary hypertension PA 37/15, mean 25 mmHg. Wedge low at 10 mm Hg. CI decreased. Moderate CAD, Mid LAD 50% and Mid to dist Cx 50%. 30% Mid RCA. LVEF 50-55%.   EKG:   11/16/2021: Sinus rhythm at a rate of 61 bpm.  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  Low voltage complexes, consider pulmonary disease pattern.  Compared EKG 11/14/2020, no significant change.  Assessment:     ICD-10-CM   1. Coronary artery disease involving native coronary artery of native heart without angina pectoris  I25.10     2. Essential hypertension  I10       No orders of the defined types were placed in this encounter.  Medications Discontinued During This Encounter  Medication Reason   potassium chloride SA (KLOR-CON M20) 20 MEQ tablet      Recommendations:  Troy Sanchez  is a 81 y.o. male  with hypertension, hyperlipidemia, moderate CAD, and mild pulmonary hypertension, chronic dyspnea and also pulmonary scarring noted on CT scan performed in 2019; therefore,  dyspnea felt to be related to interstitial lung disease. Also has history of esophageal dilation.   Patient was last seen in the office 03/05/2022 at which time due to worsening dyspnea on exertion ordered stress test and cardiac monitor and given weight gain advised patient to obtain labs and chest x-ray as well as take Lasix 20 mg p.o. daily for 3 days.  Stress test was overall low risk.  Monitor revealed no significant cardiac arrhythmias.  BMP was stable and BNP normal.  He now presents for 6-week follow-up.  Reviewed and discussed results with patient.  Cardiac work-up is overall quite reassuring and therefore suspect noncardiac etiology of patient's dyspnea.  Advised patient to follow-up with Dr. Shelia Media as well as Dr. Melvyn Novas (pulmonology) for  further evaluation.  Follow up as previously scheduled.    Alethia Berthold, PA-C 04/16/2022, 8:48 AM Office: [phone]

## 2022-04-16 ENCOUNTER — Encounter: Payer: Self-pay | Admitting: Student

## 2022-04-16 ENCOUNTER — Ambulatory Visit: Payer: Medicare Other | Admitting: Student

## 2022-04-16 VITALS — BP 130/79 | HR 55 | Temp 97.8°F | Resp 16 | Ht 70.0 in | Wt 210.0 lb

## 2022-04-16 DIAGNOSIS — I2510 Atherosclerotic heart disease of native coronary artery without angina pectoris: Secondary | ICD-10-CM

## 2022-04-16 DIAGNOSIS — I10 Essential (primary) hypertension: Secondary | ICD-10-CM

## 2022-04-27 ENCOUNTER — Encounter: Payer: Self-pay | Admitting: Student

## 2022-04-28 DIAGNOSIS — L820 Inflamed seborrheic keratosis: Secondary | ICD-10-CM | POA: Diagnosis not present

## 2022-04-28 DIAGNOSIS — H02831 Dermatochalasis of right upper eyelid: Secondary | ICD-10-CM | POA: Diagnosis not present

## 2022-04-28 DIAGNOSIS — H02834 Dermatochalasis of left upper eyelid: Secondary | ICD-10-CM | POA: Diagnosis not present

## 2022-04-28 DIAGNOSIS — H02403 Unspecified ptosis of bilateral eyelids: Secondary | ICD-10-CM | POA: Diagnosis not present

## 2022-06-01 ENCOUNTER — Ambulatory Visit: Payer: Medicare Other | Admitting: Gastroenterology

## 2022-07-04 ENCOUNTER — Other Ambulatory Visit: Payer: Self-pay | Admitting: Physician Assistant

## 2022-08-05 ENCOUNTER — Ambulatory Visit: Payer: Medicare Other | Admitting: Gastroenterology

## 2022-08-05 ENCOUNTER — Encounter: Payer: Self-pay | Admitting: Gastroenterology

## 2022-08-05 VITALS — BP 114/74 | HR 60 | Ht 68.5 in | Wt 213.4 lb

## 2022-08-05 DIAGNOSIS — K449 Diaphragmatic hernia without obstruction or gangrene: Secondary | ICD-10-CM | POA: Diagnosis not present

## 2022-08-05 DIAGNOSIS — R1319 Other dysphagia: Secondary | ICD-10-CM | POA: Diagnosis not present

## 2022-08-05 DIAGNOSIS — K222 Esophageal obstruction: Secondary | ICD-10-CM | POA: Diagnosis not present

## 2022-08-05 MED ORDER — OMEPRAZOLE 40 MG PO CPDR: 40.0000 mg | DELAYED_RELEASE_CAPSULE | Freq: Every day | ORAL | 3 refills | Status: DC

## 2022-08-05 NOTE — Patient Instructions (Signed)
_______________________________________________________  If you are age 80 or older, your body mass index should be between 23-30. Your Body mass index is 31.97 kg/m. If this is out of the aforementioned range listed, please consider follow up with your Primary Care Provider.  If you are age 62 or younger, your body mass index should be between 19-25. Your Body mass index is 31.97 kg/m. If this is out of the aformentioned range listed, please consider follow up with your Primary Care Provider.   ________________________________________________________  The [facility] providers would like to encourage you to use [facility] to communicate with providers for non-urgent requests or questions.  Due to long hold times on the telephone, sending your provider a message by [facility] may be a faster and more efficient way to get a response.  Please allow 48 business hours for a response.  Please remember that this is for non-urgent requests.  _______________________________________________________  We have refilled your omeprazole for 1 year please follow up in a year or sooner if needed.  It was a pleasure to see you today!  Thank you for trusting me with your gastrointestinal care!

## 2022-08-05 NOTE — Progress Notes (Signed)
[facility] Progress Note  Chief Complaint: Esophageal dysphagia  Subjective  History: 81 year old man last seen by me in February of this year after upper endoscopy for dysphagia.  Prior fundoplication with recurrence of the herniation seen on CT scan December 2022.  From my last note: "Reflux esophagitis requiring long-term once daily PPI, as H2 blocker was insufficient therapy to control symptoms and visible esophagitis with stricture.   Reflux related distal esophageal stricture improved after endoscopic dilation.  Dysphagia has improved, it may persist due to age-related dysmotility seen on imaging as well as tortuous distal esophagus from his hiatal hernia.  Would not recommend hernia surgery at his age if we can control his symptoms medically and endoscopically. Plan: Omeprazole 40 mg once daily, prescription written with 1 year of refills. See me in a year or sooner as needed.  He was encouraged to contact me if dysphagia recurs, as he would likely need repeat endoscopic dilation in that instance"  From June 2023 office note with PA: "09/28/2021 barium hiatal hernia, bilobed smooth narrowing between 2 lobulations possibly representing herniation of pancreatic body and adipose tissue, causing intrinsic mass effect.  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 10/21/2021 endoscopy with Dr. Loletha Carrow torturous distal esophagus, new grade C esophagitis with stricture status post balloon dilatation difficult to assess fundoplication given herniation of that area above the diaphragm Given PPI twice daily for 4 weeks then decrease down to once daily indefinitely.   Patient states the EGD did help with his dysphagia but then the last 2-3 weeks, he has had intermittent dysphagia solid foods like chicken. "  He was offered an upper endoscopy but preferred to try and increase his PPI back to twice  daily and follow-up today. ____________________________________   Mr. Studstill is feeling quite well these days.  He took twice daily PPI for 6 to 8 weeks, was much improved without difficulty swallowing, and since dropped down to once daily and continues to do well.  His appetite is "too good".  He has been somewhat apprehensive about long-term PPI because of previous C. difficile infection, at which time it was suggested that chronic PPI use may have increased his risk of contracting that infection.  ROS: Cardiovascular:  no chest pain Respiratory: no dyspnea  The patient's Past Medical, Family and Social History were reviewed and are on file in the EMR.  Objective:  Med list reviewed  Current Outpatient Medications:    aspirin EC 81 MG tablet, Take 81 mg by mouth daily. Swallow whole., Disp: , Rfl:    atorvastatin (LIPITOR) 10 MG tablet, Take 10 mg by mouth daily., Disp: , Rfl:    carboxymethylcellulose (REFRESH PLUS) 0.5 % SOLN, 1 drop 3 (three) times daily as needed., Disp: , Rfl:    cholecalciferol (VITAMIN D) 1000 UNITS tablet, Take 1,000 Units by mouth every morning. , Disp: , Rfl:    isosorbide mononitrate (IMDUR) 60 MG 24 hr tablet, TAKE 1 TABLET BY MOUTH EVERY DAY, Disp: 90 tablet, Rfl: 3   loratadine (CLARITIN) 10 MG tablet, Take 10 mg by mouth daily., Disp: , Rfl:    magnesium oxide (MAG-OX) 400 MG tablet, Take 400 mg by mouth daily., Disp: , Rfl:    Multiple Vitamin (MULTIVITAMIN WITH MINERALS) TABS tablet, Take 1 tablet by mouth daily., Disp: 30 tablet, Rfl: 0   omeprazole (PRILOSEC) 40 MG capsule, TAKE 1 CAPSULE (40 MG TOTAL) BY MOUTH 2 (  TWO) TIMES DAILY BEFORE A MEAL., Disp: 180 capsule, Rfl: 0  Current Facility-Administered Medications:    0.9 %  sodium chloride infusion, 500 mL, Intravenous, Once, Danis, Estill Cotta III, MD   Vital signs in last 24 hrs: Vitals:   08/05/22 0815  BP: 114/74  Pulse: 60   Wt Readings from Last 3 Encounters:  08/05/22 213 lb 6 oz (96.8  kg)  04/16/22 210 lb (95.3 kg)  03/26/22 209 lb (94.8 kg)    Physical Exam  Well-appearing, normal vocal quality Cardiac: Regular without murmur,  no peripheral edema Pulm: clear to auscultation bilaterally, normal RR and effort noted Abdomen: soft, no tenderness, with active bowel sounds. No guarding or palpable hepatosplenomegaly.  No new data  _____________________________________________ Assessment & Plan  Assessment: Encounter Diagnoses  Name Primary?   Esophageal dysphagia Yes   Hiatal hernia    Esophageal stricture     Dysphagia that is combination of anatomy from recurrent hiatal hernia and prior fundoplication with tortuous distal esophagus as well as overlying reflux probably causing some dysmotility that improves on PPI.  Plan: Once daily PPI indefinitely. If he has recurrence of dysphagia that occurs regularly, temporarily increase PPI to twice daily for 3 to 4 weeks and then decrease to once daily.  See me in a year or sooner as needed  22 minutes were spent on this encounter (including chart review, history/exam, counseling/coordination of care, and documentation) > 50% of that time was spent on counseling and coordination of care.   Nelida Meuse III

## 2022-08-31 DIAGNOSIS — H02102 Unspecified ectropion of right lower eyelid: Secondary | ICD-10-CM | POA: Diagnosis not present

## 2022-08-31 DIAGNOSIS — H5201 Hypermetropia, right eye: Secondary | ICD-10-CM | POA: Diagnosis not present

## 2022-08-31 DIAGNOSIS — H02105 Unspecified ectropion of left lower eyelid: Secondary | ICD-10-CM | POA: Diagnosis not present

## 2022-09-28 ENCOUNTER — Other Ambulatory Visit: Payer: Self-pay | Admitting: Physician Assistant

## 2022-11-15 ENCOUNTER — Ambulatory Visit: Payer: Medicare Other | Admitting: Student

## 2022-12-27 DIAGNOSIS — E785 Hyperlipidemia, unspecified: Secondary | ICD-10-CM | POA: Diagnosis not present

## 2022-12-27 DIAGNOSIS — R7303 Prediabetes: Secondary | ICD-10-CM | POA: Diagnosis not present

## 2022-12-27 DIAGNOSIS — R002 Palpitations: Secondary | ICD-10-CM | POA: Diagnosis not present

## 2022-12-31 DIAGNOSIS — J849 Interstitial pulmonary disease, unspecified: Secondary | ICD-10-CM | POA: Diagnosis not present

## 2022-12-31 DIAGNOSIS — Z0000 Encounter for general adult medical examination without abnormal findings: Secondary | ICD-10-CM | POA: Diagnosis not present

## 2022-12-31 DIAGNOSIS — I272 Pulmonary hypertension, unspecified: Secondary | ICD-10-CM | POA: Diagnosis not present

## 2022-12-31 DIAGNOSIS — R42 Dizziness and giddiness: Secondary | ICD-10-CM | POA: Diagnosis not present

## 2022-12-31 DIAGNOSIS — R0609 Other forms of dyspnea: Secondary | ICD-10-CM | POA: Diagnosis not present

## 2022-12-31 DIAGNOSIS — I700 Atherosclerosis of aorta: Secondary | ICD-10-CM | POA: Diagnosis not present

## 2022-12-31 DIAGNOSIS — D72819 Decreased white blood cell count, unspecified: Secondary | ICD-10-CM | POA: Diagnosis not present

## 2022-12-31 DIAGNOSIS — R918 Other nonspecific abnormal finding of lung field: Secondary | ICD-10-CM | POA: Diagnosis not present

## 2022-12-31 DIAGNOSIS — R002 Palpitations: Secondary | ICD-10-CM | POA: Diagnosis not present

## 2022-12-31 DIAGNOSIS — R519 Headache, unspecified: Secondary | ICD-10-CM | POA: Diagnosis not present

## 2022-12-31 DIAGNOSIS — I2510 Atherosclerotic heart disease of native coronary artery without angina pectoris: Secondary | ICD-10-CM | POA: Diagnosis not present

## 2023-01-13 ENCOUNTER — Ambulatory Visit: Payer: Medicare Other | Admitting: Cardiology

## 2023-01-14 ENCOUNTER — Encounter: Payer: Self-pay | Admitting: Neurology

## 2023-01-18 DIAGNOSIS — I2510 Atherosclerotic heart disease of native coronary artery without angina pectoris: Secondary | ICD-10-CM | POA: Diagnosis not present

## 2023-01-19 DIAGNOSIS — I2510 Atherosclerotic heart disease of native coronary artery without angina pectoris: Secondary | ICD-10-CM | POA: Diagnosis not present

## 2023-02-02 ENCOUNTER — Ambulatory Visit: Payer: Medicare Other | Admitting: Cardiology

## 2023-02-02 ENCOUNTER — Encounter: Payer: Self-pay | Admitting: Cardiology

## 2023-02-02 VITALS — BP 134/86 | HR 68 | Ht 68.5 in | Wt 211.0 lb

## 2023-02-02 DIAGNOSIS — J849 Interstitial pulmonary disease, unspecified: Secondary | ICD-10-CM

## 2023-02-02 DIAGNOSIS — R0602 Shortness of breath: Secondary | ICD-10-CM

## 2023-02-02 DIAGNOSIS — E7800 Pure hypercholesterolemia, unspecified: Secondary | ICD-10-CM

## 2023-02-02 DIAGNOSIS — I2510 Atherosclerotic heart disease of native coronary artery without angina pectoris: Secondary | ICD-10-CM

## 2023-02-02 NOTE — Progress Notes (Signed)
Primary Physician:  Merri Brunette, MD   Patient ID: Troy Sanchez, male    DOB: [birth year], 82 y.o.   MRN: [id number]  Subjective:    Chief Complaint  Patient presents with   Coronary artery disease involving native coronary artery of   Follow-up    HPI: Troy Sanchez  is a 82 y.o. male   male  with hypertension, hyperlipidemia, moderate CAD (cardiac catheterization in 2017 mid LAD 50% stenosis otherwise no significant disease), mild pulmonary hypertension, chronic dyspnea and also pulmonary scarring noted on CT scan performed in 2019, esophageal strictures status post dilatation in the past, chronic dyspnea presents here for annual visit and also visit requested by his PCP Dr. Merri Brunette.  Patient states that especially over the past 6 months and especially in the last 2 to 3 months his dyspnea has gotten worse.  Denies PND or orthopnea.  Even activities of daily living brings on dyspnea.  He also complains of marked daytime somnolence, sleeping inappropriately.  Patient still continues to work full-time at [employer], however unable to do any physical exertion activities without dyspnea.  No leg edema, no associated chest pain, no syncope.  Past Medical History:  Diagnosis Date   CAD (coronary artery disease) 07/02/2019   Esophageal motility disorder    Esophageal stenosis    "torturous esophagus"   GERD (gastroesophageal reflux disease) 01/07/2021   Hiatal hernia    HTN (hypertension) 07/02/2019   Hypertension    Recurrent Clostridium difficile diarrhea    Zenker diverticulum    Past Surgical History:  Procedure Laterality Date   BELPHAROPTOSIS REPAIR Bilateral    CARDIAC CATHETERIZATION N/A 05/18/2016   Procedure: Right/Left Heart Cath and Coronary Angiography;  Surgeon: Yates Decamp, MD;  Location: [facility] INVASIVE CV LAB;  Service: Cardiovascular;  Laterality: N/A;   CATARACT EXTRACTION Bilateral    CHOLECYSTECTOMY  10/11/2004   GASTROSTOMY N/A 03/06/2014   Procedure:  GASTROSTOMY;  Surgeon: Cherylynn Ridges, MD;  Location: [facility] OR;  Service: General;  Laterality: N/A;   HIATAL HERNIA REPAIR N/A 03/06/2014   Procedure: HERNIA REPAIR HIATAL;  Surgeon: Cherylynn Ridges, MD;  Location: [facility] OR;  Service: General;  Laterality: N/A;   LAPAROTOMY N/A 03/06/2014   Procedure: EXPLORATORY LAPAROTOMY;  Surgeon: Cherylynn Ridges, MD;  Location: [facility] OR;  Service: General;  Laterality: N/A;   TRANSURETHRAL RESECTION OF PROSTATE N/A 08/15/2014   Procedure: TRANSURETHRAL RESECTION OF THE PROSTATE WITH GYRUS INSTRUMENTS;  Surgeon: Anner Crete, MD;  Location: [facility] ORS;  Service: Urology;  Laterality: N/A;   UMBILICAL HERNIA REPAIR     Family History  Problem Relation Age of Onset   Colon cancer Neg Hx    Esophageal cancer Neg Hx    Stomach cancer Neg Hx    Pancreatic cancer Neg Hx    Liver disease Neg Hx    Social History   Tobacco Use   Smoking status: Never   Smokeless tobacco: Never  Substance Use Topics   Alcohol use: Yes    Comment: ocassionally   Marital Status: Married  ROS  Review of Systems  Cardiovascular:  Positive for dyspnea on exertion and leg swelling (chronic, minimal). Negative for chest pain, claudication, near-syncope, orthopnea, palpitations, paroxysmal nocturnal dyspnea and syncope.   Objective:  Blood pressure 134/86, pulse 68, height 5' 8.5" (1.74 m), weight 211 lb (95.7 kg), SpO2 95 %. Body mass index is 31.62 kg/m.    Physical Exam Vitals reviewed.  Constitutional:  Appearance: He is well-developed.  Neck:     Vascular: No carotid bruit or JVD.  Cardiovascular:     Rate and Rhythm: Normal rate and regular rhythm.     Pulses: Normal pulses and intact distal pulses.     Heart sounds: Normal heart sounds, S1 normal and S2 normal. No murmur heard.    No gallop.  Pulmonary:     Effort: Pulmonary effort is normal. No accessory muscle usage.     Breath sounds: Rales (bilateral bases, left > Right) present.  Abdominal:     General: Bowel  sounds are normal.     Palpations: Abdomen is soft.  Musculoskeletal:     Right lower leg: Edema (trace ankle) present.     Left lower leg: Edema (trace ankle) present.    Laboratory examination:   Lab Results  Component Value Date   NA 137 03/05/2022   K 4.9 03/05/2022   CO2 24 03/05/2022   GLUCOSE 110 (H) 03/05/2022   BUN 11 03/05/2022   CREATININE 1.29 (H) 03/05/2022   CALCIUM 9.5 03/05/2022   EGFR 56 (L) 03/05/2022   GFRNONAA 43 (L) 06/09/2021       Latest Ref Rng & Units 03/05/2022    9:46 AM 06/09/2021   12:10 AM 08/21/2020    1:35 PM  CMP  Glucose 70 - 99 mg/dL 962  952  841   BUN 8 - 27 mg/dL Creatinine 0.76 - 1.27 mg/dL 3.24  4.01  0.27   Sodium 134 - 144 mmol/L 137  137  139   Potassium 3.5 - 5.2 mmol/L 4.9  4.8  3.9   Chloride 96 - 106 mmol/L 99  104  103   CO2 20 - 29 mmol/L Calcium 8.6 - 10.2 mg/dL 9.5  9.2  9.2   Total Protein 6.5 - 8.1 g/dL  6.6  6.3   Total Bilirubin 0.3 - 1.2 mg/dL  1.3  2.0   Alkaline Phos 38 - 126 U/L  54  61   AST 15 - 41 U/L  24  23   ALT 0 - 44 U/L  14  14       Latest Ref Rng & Units 06/09/2021   12:10 AM 08/21/2020    1:35 PM 08/01/2020    2:38 PM  CBC  WBC 4.0 - 10.5 K/uL 7.0  9.2  15.2   Hemoglobin 13.0 - 17.0 g/dL 25.3  66.4  40.3   Hematocrit 39.0 - 52.0 % 42.6  37.3  33.0   Platelets 150 - 400 K/uL 143  249  282    Lipid Panel  No results found for: "CHOL", "TRIG", "HDL", "CHOLHDL", "VLDL", "LDLCALC", "LDLDIRECT" HEMOGLOBIN A1C No results found for: "HGBA1C", "MPG" TSH No results for input(s): "TSH" in the last 8760 hours.  BNP    Component Value Date/Time   BNP 17.1 03/05/2022 0946   External Labs:   Labs 01/06/2023:  Total cholesterol 106, triglycerides 75, HDL 33, LDL 58.  TSH normal at 3.77.  Hb 12.8/HCT 39.4, platelets 183, normal indicis.  Sodium 138, potassium 4.6, BUN 10, creatinine 1.14, EGFR 60 mL, LFTs normal.  Urine analysis normal.  Radiology   No results  found.  High-resolution CT chest 06/01/2019: 1. There is a redemonstrated pattern of mild pulmonary fibrosis with an apical to basal gradient featuring irregular peripheral interstitial opacity and minimal associated ground-glass. Mild, predominantly tubular bronchiectasis. No significant  air trapping on expiratory phase imaging. Findings are not significantly changed compared to prior examinations dating back to 04/21/2016 and remain consistent with an "indeterminate for UIP" pattern by ATS pulmonary fibrosis criteria. Per ordering indication, patient has an established diagnosis of IPF.  2.  Coronary artery disease  Chest x-ray 03/06/2022: Comparison 10/16/2021: Transverse diameter of heart is increased. There is poor inspiration. Increased interstitial markings are seen in the both lungs. There are no new focal infiltrates or signs of alveolar pulmonary edema. Lateral CP angles are indistinct. There is no pneumothorax.   IMPRESSION: Interstitial lung disease. There are no signs of pulmonary edema or new focal infiltrates.  Blunting of both lateral CP angles may suggest pleural thickening or minimal effusions.   Cardiac Studies:  Heart Cath 05/18/2016: Mild pulmonary hypertension PA 37/15, mean 25 mmHg. Wedge low at 10 mm Hg. CI decreased. Moderate CAD, Mid LAD 50% and Mid to dist Cx 50%. 30% Mid RCA. LVEF 50-55%.  Zio patch cardiac monitor 3 days (03/15/2022 - 03/18/2022): Predominant underlying rhythm was sinus with BBB present. Min HR of 48 bpm, max HR of 94 bpm, and avg HR of 62 bpm.  Rare PACs and rare PVCs, ventricular bigeminy was present.  There were no patient triggered events.  No evidence of significant cardiac arrhythmias.  Lexiscan (with Mod Bruce protocol) Nuclear stress test 03/15/2022: Nondiagnostic ECG stress. The heart rate response was consistent with Regadenoson.  Myocardial perfusion reveals diaphragmatic attenuation. Minimal ischemia in this region cannot be excluded. Overall  LV systolic function is normal without regional wall motion abnormalities. Stress LV EF: 57%.  No previous exam available for comparison. Low risk.   Echocardiogram 11/24/2021:  Left ventricle cavity is normal in size and wall thickness. Normal global  wall motion. Normal LV systolic function with EF 64%. Doppler evidence of  grade I (impaired) diastolic dysfunction, normal LAP.  Structurally normal trileaflet aortic valve. Mild (Grade I) aortic  regurgitation.  Mild tricuspid regurgitation.  The IVC is not seen.  Compared to previous study in 2019, reported LA/RV dilatation not appreciated on this study.   EKG:   EKG 02/02/2023: Normal sinus rhythm with rate of 67 bpm, left axis deviation, left anterior fascicular block.  Right bundle branch block.  Poor R progression, cannot exclude anterolateral infarct old.  Nonspecific T abnormality.  Compared to 11/16/2021, no change. Compared EKG 11/14/2020, no significant change.   Allergies & Meds:   No Known Allergies   Current Outpatient Medications:    atorvastatin (LIPITOR) 10 MG tablet, Take 10 mg by mouth daily., Disp: , Rfl:    carboxymethylcellulose (REFRESH PLUS) 0.5 % SOLN, 1 drop 3 (three) times daily as needed., Disp: , Rfl:    cholecalciferol (VITAMIN D) 1000 UNITS tablet, Take 1,000 Units by mouth every morning. , Disp: , Rfl:    isosorbide mononitrate (IMDUR) 60 MG 24 hr tablet, TAKE 1 TABLET BY MOUTH EVERY DAY, Disp: 90 tablet, Rfl: 3   loratadine (CLARITIN) 10 MG tablet, Take 10 mg by mouth daily., Disp: , Rfl:    magnesium oxide (MAG-OX) 400 MG tablet, Take 400 mg by mouth daily., Disp: , Rfl:    Multiple Vitamin (MULTIVITAMIN WITH MINERALS) TABS tablet, Take 1 tablet by mouth daily., Disp: 30 tablet, Rfl: 0   omeprazole (PRILOSEC) 40 MG capsule, TAKE 1 CAPSULE (40 MG TOTAL) BY MOUTH 2 (TWO) TIMES DAILY BEFORE A MEAL., Disp: 180 capsule, Rfl: 2   aspirin EC 81 MG tablet, Take 81 mg by mouth  daily. Swallow whole. (Patient not  taking: Reported on 02/02/2023), Disp: , Rfl:    Assessment:     ICD-10-CM   1. Shortness of breath  R06.02     2. ILD (interstitial lung disease)  J84.9     3. Coronary artery disease involving native coronary artery of native heart without angina pectoris  I25.10 EKG 12-Lead    4. Pure hypercholesterolemia  E78.00       No orders of the defined types were placed in this encounter.  Medications Discontinued During This Encounter  Medication Reason   0.9 %  sodium chloride infusion      Recommendations:  Troy Sanchez  is a 82 y.o. male  with hypertension, hyperlipidemia, moderate CAD (cardiac catheterization in 2017 mid LAD 50% stenosis otherwise no significant disease), mild pulmonary hypertension, chronic dyspnea and also pulmonary scarring noted on CT scan performed in 2019, esophageal strictures status post dilatation in the past, chronic dyspnea presents here for annual visit and also visit requested by his PCP Dr. Merri Brunette.  1. Shortness of breath Although patient has mild to moderate coronary disease by catheterization almost 8 to 9 years ago, patient being nondiabetic, with no angina but mostly presenting with dyspnea, no EKG abnormalities, do not suspect cardiac etiology.  Will obtain echocardiogram to evaluate for pulmonary hypertension.  - PCV ECHOCARDIOGRAM COMPLETE; Future  2. ILD (interstitial lung disease)  Advised patient to follow-up with Dr. Renne Crigler as well as Dr. Sherene Sires (pulmonology) for further evaluation.  I suspect either chronic aspiration due to esophageal stricture or idiopathic ILD to be etiology.  Patient is very active, has significantly slowed down in the last 6 months, CT scan revealing progression of disease with regard to IPF and he will need further workup including evaluation for oxygen needs, nocturnal hypoxemia, unlikely but possible OSA as patient has now developed severe daytime somnolence, daytime sleepiness, inappropriate sleepiness and marked  dyspnea.  Do not suspect his symptoms are related to coronary artery disease.  I discussed this with the patient and his wife.  I will forward my note to Dr. Sandrea Hughs for consideration of my opinion.  3. Coronary artery disease involving native coronary artery of native heart without angina pectoris As discussed above, very mild coronary disease noted, risk factors are well-controlled.  Suspicion for progression is low. - EKG 12-Lead - PCV ECHOCARDIOGRAM COMPLETE; Future  4. Pure hypercholesterolemia Lipids under excellent control.  Continue present medical management.  Will see him back in 3 months or sooner if problems.   Yates Decamp, MD, [facility] 02/02/2023, 10:57 PM Office: [phone] Fax: [phone] Pager: [phone]

## 2023-02-08 NOTE — Progress Notes (Unsigned)
Subjective:     Patient ID: Troy Sanchez, male   DOB: [birth year],    MRN: [id number]    Brief patient profile: 87 yowm never smoker with onset of doe around late spring  2017  > referred to [facility] who dx PH by [facility] 05/2016 and concerned about crackles on exam 10/31/17 so referred to pulmonary clinic 11/04/2017 by Dr   Nadara Eaton     History of Present Illness  11/04/2017 1st [facility] office visit/ Troy Sanchez   Chief Complaint  Patient presents with   Pulmonary Consult    Referred by Dr. Jacinto Halim for eval of pulmonary hypertension.  He states he has had DOE for the past 2 yrs. He states he only gets winded when he walks up an incline.    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   Dyspnea:  Walking neighborhood 15-20 min and stops on steep hills, does not check sats as rec  Cough: none Sleeping: 10 degrees s resp symptoms  SABA use: none 02: none   rec Make sure you check your oxygen saturations at highest level of activity to be sure it stays over 90% and call me if trending downward    02/07/20  Dx of covid [facility]   - says took vaccine x one dose after initial infection s side effedts and? 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 No new co's today.   Dyspnea:  Walking dogs in [place] area with hills s stopping twice daily  Cough: none Sleeping: hosp bed is at 10 degrees s resp c/o's SABA use: none  02: none/ sats are  running no lower than 93-94%  Apparently took on vaccine ? 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   Dyspnea:  no problem with walking dogs/ hills  Cough: none unless having indigestion  Sleeping: electric bed up 10 degrees  SABA use: none  02: none  Rec Make sure you check your oxygen saturation at your highest level of activity to be sure it stays over 90%   GERD diet reviewed, bed blocks rec    02/09/2023  f/u ov/Troy Sanchez re: NSIP   maint on prilosec 40 mg  ac qAM only  Chief Complaint  Patient presents with   Follow-up    C/o increased sob gradually worsening x 1 year, dry cough, occass. Wheezing, headache, lightheaded at times  Dyspnea:  still walking dogs not checking sats  Cough: dry day > noct  Sleeping: electric bed 10 degrees  SABA use: none  02: none  No obvious day to day or daytime variability or assoc excess/ purulent sputum or mucus plugs or hemoptysis or cp or chest tightness,  or overt sinus or hb symptoms.   Sleeping  without nocturnal  or early am exacerbation  of respiratory  c/o's or need for noct saba. Also denies any obvious fluctuation of symptoms with weather or environmental changes or other aggravating or alleviating factors except as outlined above   No unusual exposure hx or h/o childhood pna/ asthma or knowledge of premature birth.  Current Allergies, Complete Past Medical History, Past Surgical History, Family History, and Social History were reviewed in [employer] record.  ROS  The following are not active complaints unless bolded Hoarseness, sore throat, dysphagia, dental problems, itching, sneezing,  nasal congestion or discharge of excess mucus or purulent secretions, ear ache,   fever, chills, sweats, unintended wt loss or wt gain, classically pleuritic or exertional cp,   orthopnea pnd or arm/hand swelling  or leg swelling/ min, end of day, presyncope, palpitations, abdominal pain, anorexia, nausea, vomiting, diarrhea  or change in bowel habits or change in bladder habits, change in stools or change in urine, dysuria, hematuria,  rash, arthralgias, visual complaints, headache, numbness, weakness or ataxia or problems with walking or coordination,  change in mood or  memory.        Current Meds  Medication Sig   aspirin EC 81 MG tablet Take 81 mg by mouth daily. Swallow whole.   atorvastatin (LIPITOR) 10 MG tablet Take 10 mg by mouth daily.   carboxymethylcellulose (REFRESH PLUS) 0.5 % SOLN 1 drop 3 (three) times daily as needed.   cholecalciferol (VITAMIN D) 1000 UNITS tablet Take 1,000 Units by mouth every morning.    isosorbide mononitrate (IMDUR) 60 MG 24 hr tablet TAKE 1 TABLET BY MOUTH EVERY DAY   loratadine (CLARITIN) 10 MG tablet Take 10 mg by mouth daily.   magnesium oxide (MAG-OX) 400 MG tablet Take 400 mg by mouth daily.   Multiple Vitamin (MULTIVITAMIN WITH MINERALS) TABS tablet Take 1 tablet by mouth daily.   omeprazole (PRILOSEC) 40 MG capsule TAKE 1 CAPSULE (40 MG TOTAL) BY MOUTH 2 (TWO) TIMES DAILY BEFORE A MEAL. (Patient taking differently: 40 mg daily.)                        Objective:   Physical Exam  Wts  02/09/2023         208  10/16/2021         199  09/18/2020         177 11/12/2019          217  05/08/2019        217  07/31/2018      219  01/24/2018        212   12/09/2017        209   11/04/17 206 lb (93.4 kg)  05/18/16 215 lb (97.5 kg)  08/15/14 203 lb 14.8 oz (92.5 kg)   Vital signs reviewed  02/09/2023  - Note at rest 02 sats  96% on RA   General appearance:    amb elderly wm nad    HEENT : Oropharynx  clear     Nasal turbinates nl    NECK :  without  apparent JVD/ palpable Nodes/TM    LUNGS: no acc muscle use, Slt kyphotic  contour chest  with a few coarse crackles both bases but no cough on  deep insp  CV:  RRR   no s3 or murmur or increase in P2, and no edema   ABD:  soft and nontender with nl inspiratory excursion in the supine position. No bruits or organomegaly appreciated   MS:  Nl gait/ ext warm without deformities Or obvious joint restrictions  calf tenderness, cyanosis   NO clubbing    SKIN: warm and dry without lesions    NEURO:  alert, approp, nl sensorium with  no motor or cerebellar deficits apparent.       CXR PA and Lateral:   02/09/2023 :    I personally reviewed images and impression is as follows:     Decreased lung vol. Mild kyphosis minimal changes since 03/05/22      Assessment:

## 2023-02-09 ENCOUNTER — Encounter: Payer: Self-pay | Admitting: Internal Medicine

## 2023-02-09 ENCOUNTER — Ambulatory Visit: Payer: Medicare Other | Admitting: Internal Medicine

## 2023-02-09 ENCOUNTER — Ambulatory Visit (INDEPENDENT_AMBULATORY_CARE_PROVIDER_SITE_OTHER): Payer: Medicare Other

## 2023-02-09 VITALS — BP 118/70 | HR 61 | Temp 98.3°F | Ht 70.0 in | Wt 208.4 lb

## 2023-02-09 DIAGNOSIS — R0609 Other forms of dyspnea: Secondary | ICD-10-CM | POA: Diagnosis not present

## 2023-02-09 DIAGNOSIS — R918 Other nonspecific abnormal finding of lung field: Secondary | ICD-10-CM | POA: Diagnosis not present

## 2023-02-09 DIAGNOSIS — J8489 Other specified interstitial pulmonary diseases: Secondary | ICD-10-CM

## 2023-02-09 LAB — CBC WITH DIFFERENTIAL/PLATELET
Basophils Absolute: 0.1 10*3/uL (ref 0.0–0.1)
Basophils Relative: 1.2 % (ref 0.0–3.0)
Eosinophils Absolute: 0.2 10*3/uL (ref 0.0–0.7)
Eosinophils Relative: 3.9 % (ref 0.0–5.0)
HCT: 40.6 % (ref 39.0–52.0)
Hemoglobin: 13.3 g/dL (ref 13.0–17.0)
Lymphocytes Relative: 28.6 % (ref 12.0–46.0)
Lymphs Abs: 1.3 10*3/uL (ref 0.7–4.0)
MCHC: 32.7 g/dL (ref 30.0–36.0)
MCV: 77 fl — ABNORMAL LOW (ref 78.0–100.0)
Monocytes Absolute: 0.4 10*3/uL (ref 0.1–1.0)
Monocytes Relative: 8.3 % (ref 3.0–12.0)
Neutro Abs: 2.6 10*3/uL (ref 1.4–7.7)
Neutrophils Relative %: 58 % (ref 43.0–77.0)
Platelets: 173 10*3/uL (ref 150.0–400.0)
RBC: 5.27 Mil/uL (ref 4.22–5.81)
RDW: 15.8 % — ABNORMAL HIGH (ref 11.5–15.5)
WBC: 4.5 10*3/uL (ref 4.0–10.5)

## 2023-02-09 LAB — BRAIN NATRIURETIC PEPTIDE: Pro B Natriuretic peptide (BNP): 29 pg/mL (ref 0.0–100.0)

## 2023-02-09 LAB — D-DIMER, QUANTITATIVE: D-Dimer, Quant: 0.55 mcg/mL FEU — ABNORMAL HIGH (ref <0.50)

## 2023-02-09 LAB — SEDIMENTATION RATE: Sed Rate: 8 mm/hr (ref 0–20)

## 2023-02-09 MED ORDER — FAMOTIDINE 20 MG PO TABS
ORAL_TABLET | ORAL | 11 refills | Status: DC
Start: 2023-02-09 — End: 2023-03-16

## 2023-02-09 NOTE — Assessment & Plan Note (Signed)
Onset of symptoms  spring 2017 [facility] 05/18/16 with PA mean  17 and wedge 5 and CO  = 5.65  So PVR = 12/5.65 x 80 = 170 (wnl) Echo 10/31/17 :  Low nl with borderline global hypokinesis ef 50-55% and Mild LAE/ RVE / trace MR  - no change vs 04/28/16  - 11/04/2017  Walked RA x 3 laps @ 185 ft each stopped due to  End of study, fast pace, no sob or desat    - PFT's  12/09/2017   FVC 3.82 (90%)  No obst    p nothing prior to study with DLCO ( 19.63) 60 % corrects to (4.04) 87  % for alv volume   - 12/09/2017  Walked RA x 3 laps @ 185 ft each stopped due to  End of study, fast pace, no sob or desat    - ONO RA 12/12/17 desats < 89% x 26 min  - in absence of PH /symptoms do not rec rx  - HRCT 01/20/2018 1. Appearance of the lungs is compatible with interstitial lung disease. CT pattern is considered indeterminate for usual interstitial pneumonia (UIP). 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 - 01/24/2018  Walked RA x 3 laps @ 185 ft each stopped due to  End of study,fast  pace, no sob or desat    - 6 mw 07/31/2018   378 mild sob no desats - 05/08/2019   Walked RA  2 laps @  approx 229ft each @ avg pace  stopped due to  End of study min sob, no cp, sats 94% - PFT's  05/08/2019  FVC  3.74 (89%) prior to study with DLCO  16.92 (67%) corrects to 3.37 (85%)  for alv volume and FV curve s convexity    - Labs 05/08/19  :   Neg collagen vasc profile with esr 7,  HSP serology neg  - HRCT 06/01/19 :  Findings are not significantly changed compared to prior examinations dating back to 04/21/2016 remain non diagnostic  - 11/12/2019   Walked RA x two laps =  approx 566ft @ moderate pace - stopped due to end of study, no sob with sats of 96 % at the end of the study. - 10/16/2021   Walked on RA  x  3  lap(s) =  approx 750  ft  @ mod pace, stopped due to end of study  with lowest 02 sats 97%  - 02/09/2023    Walked on RA  x  2  lap(s) =  approx 500  ft  @ slow pace, stopped due to sob with lowest 02 sats 96%    Use of PPI is associated with improved survival time and with decreased radiologic fibrosis per King's study published in AJRCCM vol 184 p1390.  Dec 2011 and also may have other beneficial effects as per the latest review in [place] vol 193 p1345 Jun 20016.  This may not always be cause and effect, but given how universally underwhelming  (at least in terms of short term benefit)   and expensive  all the other  Drugs developed to date have been for pf,  not NSIP but may get benefit in this setting nontheless so  rec start  rx ppi / diet/ lifestyle modification and f/u with serial walking sats and lung volumes for now to put more points on the curve / establish  firm baseline before considering additional measures.  Will f/u q 3 m going forward and in meantime advise:  Make sure you check your oxygen saturation at your highest level of activity(NOT after you stop)  to be sure it stays over 90% and keep track of it at least once a week, more often if breathing getting worse, and let me know if losing ground. (Collect the dots to connect the dots approach)    Each maintenance medication was reviewed in detail including emphasizing most importantly the difference between maintenance and prns and under what circumstances the prns are to be triggered using an action plan format where appropriate.  Total time for H and P, chart review, counseling,  directly observing portions of ambulatory 02 saturation study/ and generating customized AVS unique to this office visit / same day charting > 30 min

## 2023-02-09 NOTE — Patient Instructions (Signed)
Continue Omeprazole  40 mg   Take  30-60 min before first meal of the day and Pepcid (famotidine)  20 mg after supper until return to office - this is the best way to tell whether stomach acid is contributing to your problem.    .Please remember to go to the lab department   for your tests - we will call you with the results when they are available.      Please remember to go to the  x-ray department  for your tests - we will call you with the results when they are available     Make sure you check your oxygen saturation at your highest level of activity(NOT after you stop)  to be sure it stays over 90% and keep track of it at least once a week, more often if breathing getting worse, and let me know if losing ground. (Collect the dots to connect the dots approach)    Please schedule a follow up visit in 3 months but call sooner if needed- [place] office

## 2023-02-16 ENCOUNTER — Other Ambulatory Visit: Payer: Self-pay

## 2023-02-16 MED ORDER — ISOSORBIDE MONONITRATE ER 60 MG PO TB24: 60.0000 mg | ORAL_TABLET | Freq: Every day | ORAL | 3 refills | Status: DC

## 2023-02-23 NOTE — Progress Notes (Signed)
NEUROLOGY CONSULTATION NOTE  [name] MRN: [id number] DOB: 03-24-1941  Referring provider: Merri Brunette, MD Primary care provider: Merri Brunette, MD  Reason for consult:  headache, dizziness  Assessment/Plan:   Dizziness - unclear etiology.  Orthostatic vitals negative.  Description of dizziness and lack of lateralizing symptoms makes cerebrovascular etiology less likely, but would evaluate for vertebrobasilar insufficiency.  Unclear if it could be related to hypoxia Headache - may be complicated by medication overuse and hypoxia.   Check CTA of head.  Further recommendations pending results. Limit use of pain relievers to no more than 2 days out of week to prevent risk of rebound or medication-overuse headache. At this time, headaches aren't severe or frequent enough that he believes warrant starting a daily preventative.   Subjective:  Troy Sanchez is an 82  year old right-handed male with CAD, HTN, hypercholesterolemia, interstitial lung disease, GERD and esophageal stenosis who presents for dizziness and headache.  History supplemented by referring provider's note.  He started experiencing dizziness and headache in late 2022.  Headaches are diffuse non-throbbing ache associated with some mild photophobia and blurred vision.  They last anywhere from a few minutes to an hour.  They typically will start later in the day.  Mild-moderate headaches occur every week.  At one point, he had frequent severe headaches.  He hasn't had a severe headache in several weeks.  He has a pretty persistent mild head pressure, like his head is in a vice.  He notes blurred vision.  He reports dizziness as a lightheadedness and sensation of losing balance.  He has at times felt like he would pass out but hasn't.  It is not a spinning sensation.  It occurs spontaneously.  Lasts up to a day.  Resolves if he can sit and remain still for awhile.  He has had palpitations and dyspnea.   His O2 would  typically remain around 91%.  If he walks on an incline, it may drop to 87%, rarely 85%.  He notes increased dizziness with drop in O2 sat.  He has mild CAD and interstitial lung disease.  He has been evaluated by both cardiology and pulmonology with unremarkable workup.  He has had two eye exams over the past year which were unremarkable.  When symptoms started, he had an MRI of brain without contrast on 10/08/2021 personally reviewed showed moderate chronic small vessel ischemic changes in the bilateral cerebral white matter but no acute abnormalities.   PAST MEDICAL HISTORY: Past Medical History:  Diagnosis Date   CAD (coronary artery disease) 07/02/2019   Esophageal motility disorder    Esophageal stenosis    "torturous esophagus"   GERD (gastroesophageal reflux disease) 01/07/2021   Hiatal hernia    HTN (hypertension) 07/02/2019   Hypertension    Recurrent Clostridium difficile diarrhea    Zenker diverticulum     PAST SURGICAL HISTORY: Past Surgical History:  Procedure Laterality Date   BELPHAROPTOSIS REPAIR Bilateral    CARDIAC CATHETERIZATION N/A 05/18/2016   Procedure: Right/Left Heart Cath and Coronary Angiography;  Surgeon: Yates Decamp, MD;  Location: [facility] INVASIVE CV LAB;  Service: Cardiovascular;  Laterality: N/A;   CATARACT EXTRACTION Bilateral    CHOLECYSTECTOMY  10/11/2004   GASTROSTOMY N/A 03/06/2014   Procedure: GASTROSTOMY;  Surgeon: Cherylynn Ridges, MD;  Location: [facility] OR;  Service: General;  Laterality: N/A;   HIATAL HERNIA REPAIR N/A 03/06/2014   Procedure: HERNIA REPAIR HIATAL;  Surgeon: Cherylynn Ridges, MD;  Location: [facility] OR;  Service: General;  Laterality: N/A;   LAPAROTOMY N/A 03/06/2014   Procedure: EXPLORATORY LAPAROTOMY;  Surgeon: Cherylynn Ridges, MD;  Location: [facility] OR;  Service: General;  Laterality: N/A;   TRANSURETHRAL RESECTION OF PROSTATE N/A 08/15/2014   Procedure: TRANSURETHRAL RESECTION OF THE PROSTATE WITH GYRUS INSTRUMENTS;  Surgeon: Anner Crete, MD;   Location: [facility] ORS;  Service: Urology;  Laterality: N/A;   UMBILICAL HERNIA REPAIR      MEDICATIONS: Current Outpatient Medications on File Prior to Visit  Medication Sig Dispense Refill   aspirin EC 81 MG tablet Take 81 mg by mouth daily. Swallow whole.     atorvastatin (LIPITOR) 10 MG tablet Take 10 mg by mouth daily.     carboxymethylcellulose (REFRESH PLUS) 0.5 % SOLN 1 drop 3 (three) times daily as needed.     cholecalciferol (VITAMIN D) 1000 UNITS tablet Take 1,000 Units by mouth every morning.      famotidine (PEPCID) 20 MG tablet One after supper 30 tablet 11   isosorbide mononitrate (IMDUR) 60 MG 24 hr tablet Take 1 tablet (60 mg total) by mouth daily. 90 tablet 3   loratadine (CLARITIN) 10 MG tablet Take 10 mg by mouth daily.     magnesium oxide (MAG-OX) 400 MG tablet Take 400 mg by mouth daily.     Multiple Vitamin (MULTIVITAMIN WITH MINERALS) TABS tablet Take 1 tablet by mouth daily. 30 tablet 0   omeprazole (PRILOSEC) 40 MG capsule TAKE 1 CAPSULE (40 MG TOTAL) BY MOUTH 2 (TWO) TIMES DAILY BEFORE A MEAL. (Patient taking differently: 40 mg daily.) 180 capsule 2   No current facility-administered medications on file prior to visit.    ALLERGIES: No Known Allergies  FAMILY HISTORY: Family History  Problem Relation Age of Onset   Colon cancer Neg Hx    Esophageal cancer Neg Hx    Stomach cancer Neg Hx    Pancreatic cancer Neg Hx    Liver disease Neg Hx     Objective:  Blood pressure 123/83, pulse 66, resp. rate 20, height 5\' 10"  (1.778 m), weight 211 lb (95.7 kg), SpO2 98 %. Orthostatic vitals:  BP  HR Supine 110/80  60 Sitting 100/80  64 Stand 120/80  68 General: No acute distress.  Patient appears well-groomed.   Head:  Normocephalic/atraumatic Eyes:  fundi examined but not visualized Neck: supple, no paraspinal tenderness, full range of motion Heart: regular rate and rhythm Neurological Exam: Mental status: alert and oriented to person, place, and time, speech  fluent and not dysarthric, language intact. Cranial nerves: CN I: not tested CN II: pupils equal, round and reactive to light, visual fields intact CN III, IV, VI:  full range of motion, no nystagmus, no ptosis CN V: facial sensation intact. CN VII: upper and lower face symmetric CN VIII: hearing intact CN IX, X: gag intact, uvula midline CN XI: sternocleidomastoid and trapezius muscles intact CN XII: tongue midline Bulk & Tone: normal, no fasciculations. Motor:  muscle strength 5/5 throughout Sensation:  Temperature and vibratory sensation intact. Deep Tendon Reflexes:  2+ throughout,  toes downgoing.   Finger to nose testing:  Without dysmetria.    Gait:  Normal station and stride.  Romberg with sway.    Thank you for allowing me to take part in the care of this patient.  Shon Millet, DO  CC: Merri Brunette, MD

## 2023-02-24 DIAGNOSIS — R519 Headache, unspecified: Secondary | ICD-10-CM | POA: Diagnosis not present

## 2023-02-24 DIAGNOSIS — I272 Pulmonary hypertension, unspecified: Secondary | ICD-10-CM | POA: Diagnosis not present

## 2023-02-24 DIAGNOSIS — I2510 Atherosclerotic heart disease of native coronary artery without angina pectoris: Secondary | ICD-10-CM | POA: Diagnosis not present

## 2023-02-28 ENCOUNTER — Ambulatory Visit: Payer: Medicare Other | Admitting: Neurology

## 2023-02-28 ENCOUNTER — Encounter: Payer: Self-pay | Admitting: Neurology

## 2023-02-28 VITALS — BP 123/83 | HR 66 | Resp 20 | Ht 70.0 in | Wt 211.0 lb

## 2023-02-28 DIAGNOSIS — R42 Dizziness and giddiness: Secondary | ICD-10-CM | POA: Diagnosis not present

## 2023-02-28 DIAGNOSIS — R519 Headache, unspecified: Secondary | ICD-10-CM

## 2023-02-28 NOTE — Patient Instructions (Addendum)
We will check a CT of the arteries in the head.  If unremarkable, I do not suspect a neurologic cause for the dizziness  Limit use of pain relievers (such as Advil and Tylenol) to no more than 2 days out of week to prevent risk of rebound or medication-overuse headache.

## 2023-03-09 ENCOUNTER — Ambulatory Visit: Payer: Medicare Other

## 2023-03-09 DIAGNOSIS — R0602 Shortness of breath: Secondary | ICD-10-CM

## 2023-03-09 DIAGNOSIS — I2510 Atherosclerotic heart disease of native coronary artery without angina pectoris: Secondary | ICD-10-CM | POA: Diagnosis not present

## 2023-03-10 ENCOUNTER — Ambulatory Visit: Payer: Medicare Other | Admitting: Physician Assistant

## 2023-03-13 NOTE — Progress Notes (Signed)
Let patient know the echo is normal

## 2023-03-15 NOTE — Progress Notes (Unsigned)
03/16/2023 EGAN HENDLER [id number] 06-24-1941  Referring provider: Merri Brunette, MD Primary GI doctor: Dr. Myrtie Neither  ASSESSMENT AND PLAN:  82 year old male with history of erosive esophagitis grade C, status post dilation with EGD 10/2021, barium swallow with prominent cricopharyngeus muscle and nonspecific esophageal dysmotility presents with dysphagia This was several months ago, with regurgitation, has since resolved Patient is only on Prilosec 40 mg once daily due to history of C. Difficile Has had some mild reflux. Has had some neurological issues with dizziness, headaches and falls getting workup with neurology With history of erosive esophagitis continue Prilosec 40 mg once daily, add on Pepcid 20 mg at night, patient has other episodes of dysphagia with suggest increasing PPI to twice daily and calling our office for barium swallow. Dysphagia information and diet given, chin tuck Follow-up 6 months, call sooner if any issues   History of Present Illness:  82 y.o. male  with a past medical history of CAD on aspirin (07/03/2020 echo with an LVEF 65-70%), GERD, histro of strictures, prior fundoplication with recurrence of hernia seen on CT 09/2021, history of Cdfiff  and others listed below, returns to clinic today for evaluation of dysphagia.  08/08/2018 EGD with LA grade B reflux esophagitis, tortuous esophagus and otherwise normal.  Dysphagia is appeared to be a combination of lower esophageal anatomy and probable reflux related dysmotility.   09/21/2021 office visit with PA for worsening reflux and dysphagia 09/28/2021 barium hiatal hernia, bilobed smooth narrowing between 2 lobulations possibly representing herniation of pancreatic body and adipose tissue, causing intrinsic mass effect.  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 10/21/2021 endoscopy with Dr. Myrtie Neither torturous distal  esophagus, new grade C esophagitis with stricture status post balloon dilatation difficult to assess fundoplication given herniation of that area above the diaphragm 03/2022 OV with me, increase PPI to twice daily.  07/2022 OV with Dr. Myrtie Neither his symptoms were improved with the twice daily, but he went back to once daily due to history of Cdiff.    Presents today for dysphagia. States several months ago, felt like some of his food would get hung in his throat, would force himself to throw up and felt better or he would spit up mucus and felt better.  Denies any coughing/choking with eating/drinking, no isssues with liquids  He was on the once daily prilosec.  Has some SOB with cough especially with exertion, has had his O2 drop occ and they are monitoring it.  Has never had sleep study.  Falls asleep very quickly.  He has been following with neuro for dizziness, falls, and headaches.   Current Medications:    Current Outpatient Medications (Cardiovascular):    atorvastatin (LIPITOR) 10 MG tablet, Take 10 mg by mouth daily.   isosorbide mononitrate (IMDUR) 60 MG 24 hr tablet, Take 1 tablet (60 mg total) by mouth daily.  Current Outpatient Medications (Respiratory):    loratadine (CLARITIN) 10 MG tablet, Take 10 mg by mouth daily.  Current Outpatient Medications (Analgesics):    aspirin EC 81 MG tablet, Take 81 mg by mouth daily. Swallow whole.   Current Outpatient Medications (Other):    carboxymethylcellulose (REFRESH PLUS) 0.5 % SOLN, 1 drop 3 (three) times daily as needed.   cholecalciferol (VITAMIN D) 1000 UNITS tablet, Take 1,000 Units by mouth every morning.    magnesium oxide (MAG-OX) 400 MG tablet, Take 400 mg by mouth daily.   Multiple Vitamin (MULTIVITAMIN  WITH MINERALS) TABS tablet, Take 1 tablet by mouth daily.   omeprazole (PRILOSEC) 40 MG capsule, TAKE 1 CAPSULE (40 MG TOTAL) BY MOUTH 2 (TWO) TIMES DAILY BEFORE A MEAL. (Patient taking differently: 40 mg daily.)    famotidine (PEPCID) 20 MG tablet, One after supper  Surgical History:  He  has a past surgical history that includes Cholecystectomy (10/11/2004); Umbilical hernia repair; laparotomy (N/A, 03/06/2014); Hiatal hernia repair (N/A, 03/06/2014); Gastrostomy (N/A, 03/06/2014); Transurethral resection of prostate (N/A, 08/15/2014); Cardiac catheterization (N/A, 05/18/2016); Cataract extraction (Bilateral); and Blepharoptosis repair (Bilateral). Family History:  His family history is not on file. Social History:   reports that he has never smoked. He has never used smokeless tobacco. He reports current alcohol use. He reports that he does not use drugs.  Current Medications, Allergies, Past Medical History, Past Surgical History, Family History and Social History were reviewed in [employer] record.  Physical Exam: BP 100/70   Pulse 62   Ht 5\' 10"  (1.778 m)   Wt 210 lb (95.3 kg)   BMI 30.13 kg/m  General:   Pleasant, well developed male in no acute distress Heart : Regular rate and rhythm; no murmurs Pulm: Clear anteriorly; no wheezing Abdomen:  Soft, Obese AB, Active bowel sounds. No tenderness . Without guarding and Without rebound, No organomegaly appreciated. Rectal: Not evaluated Extremities:  without  edema. Neurologic:  Alert and  oriented x4;  No focal deficits.  Psych:  Cooperative. Normal mood and affect.   Doree Albee, PA-C 03/16/23

## 2023-03-15 NOTE — Progress Notes (Signed)
Called patient to inform him about his echo results. Patient understood.

## 2023-03-16 ENCOUNTER — Ambulatory Visit: Payer: Medicare Other | Admitting: Physician Assistant

## 2023-03-16 ENCOUNTER — Encounter: Payer: Self-pay | Admitting: Physician Assistant

## 2023-03-16 VITALS — BP 100/70 | HR 62 | Ht 70.0 in | Wt 210.0 lb

## 2023-03-16 DIAGNOSIS — A0471 Enterocolitis due to Clostridium difficile, recurrent: Secondary | ICD-10-CM | POA: Diagnosis not present

## 2023-03-16 DIAGNOSIS — K449 Diaphragmatic hernia without obstruction or gangrene: Secondary | ICD-10-CM | POA: Diagnosis not present

## 2023-03-16 DIAGNOSIS — R1319 Other dysphagia: Secondary | ICD-10-CM

## 2023-03-16 MED ORDER — FAMOTIDINE 20 MG PO TABS: ORAL_TABLET | ORAL | 3 refills | Status: DC

## 2023-03-16 NOTE — Patient Instructions (Addendum)
Please call to schedule a 6 month follow up with Quentin Mulling or Dr Myrtie Neither!  We have sent the following medications to your pharmacy for you to pick up at your convenience: Pepcid-20 mg  Check on a sleep study with your PCP.   Add on pepcid 20 mg at night to the prilosec in the morning Take reflux medications 30+ minutes before food in the morning  Begin meals with warm beverage Eat smaller more frequent meals  Eat slowly, taking small bites and sips Alternate solids and liquids Avoid foods/liquids that increase acid production  Sit upright during and for 30+ minutes after meals to facilitate esophageal clearing  All meats should be chopped finely.  Can do chin tuck technique.   If you have issues again, increase prilosec to twice a day and call so we can get a barium swallow  If something gets hung in your esophagus and will not come up or go down, proceed to the emergency room.        _______________________________________________________  If your blood pressure at your visit was 140/90 or greater, please contact your primary care physician to follow up on this.  _______________________________________________________  If you are age 11 or older, your body mass index should be between 23-30. Your Body mass index is 30.13 kg/m. If this is out of the aforementioned range listed, please consider follow up with your Primary Care Provider.  If you are age 31 or younger, your body mass index should be between 19-25. Your Body mass index is 30.13 kg/m. If this is out of the aformentioned range listed, please consider follow up with your Primary Care Provider.   ________________________________________________________  The [facility] providers would like to encourage you to use [facility] to communicate with providers for non-urgent requests or questions.  Due to long hold times on the telephone, sending your provider a message by [facility] may be a faster and more efficient way to get  a response.  Please allow 48 business hours for a response.  Please remember that this is for non-urgent requests.  _______________________________________________________ It was a pleasure to see you today!  Thank you for trusting me with your gastrointestinal care!

## 2023-03-17 NOTE — Progress Notes (Signed)
____________________________________________________________  Attending physician addendum:  Thank you for sending this case to me. I have reviewed the entire note and agree with the plan.   Farren Nelles Danis, MD  ____________________________________________________________  

## 2023-03-30 ENCOUNTER — Ambulatory Visit
Admission: RE | Admit: 2023-03-30 | Discharge: 2023-03-30 | Disposition: A | Discharge status: Home / Self Care | Payer: Medicare Other | Source: Ambulatory Visit | Attending: Neurology | Admitting: Neurology

## 2023-03-30 DIAGNOSIS — R42 Dizziness and giddiness: Secondary | ICD-10-CM | POA: Diagnosis not present

## 2023-03-30 DIAGNOSIS — R410 Disorientation, unspecified: Secondary | ICD-10-CM | POA: Diagnosis not present

## 2023-03-30 DIAGNOSIS — R519 Headache, unspecified: Secondary | ICD-10-CM | POA: Diagnosis not present

## 2023-03-30 DIAGNOSIS — H539 Unspecified visual disturbance: Secondary | ICD-10-CM | POA: Diagnosis not present

## 2023-03-30 MED ORDER — IOPAMIDOL (ISOVUE-370) INJECTION 76%
75.0000 mL | Freq: Once | INTRAVENOUS | Status: AC | PRN
  Administered 2023-03-30: 75 mL via INTRAVENOUS

## 2023-04-13 NOTE — Progress Notes (Signed)
Patient advised.

## 2023-05-12 ENCOUNTER — Encounter: Payer: Self-pay | Admitting: Internal Medicine

## 2023-05-12 ENCOUNTER — Ambulatory Visit: Payer: Medicare Other | Admitting: Cardiology

## 2023-05-12 ENCOUNTER — Ambulatory Visit: Payer: Medicare Other | Admitting: Internal Medicine

## 2023-05-12 VITALS — BP 120/78 | HR 69 | Ht 70.0 in | Wt 209.0 lb

## 2023-05-12 DIAGNOSIS — J8489 Other specified interstitial pulmonary diseases: Secondary | ICD-10-CM

## 2023-05-12 DIAGNOSIS — K429 Umbilical hernia without obstruction or gangrene: Secondary | ICD-10-CM | POA: Insufficient documentation

## 2023-05-12 DIAGNOSIS — R972 Elevated prostate specific antigen [PSA]: Secondary | ICD-10-CM | POA: Insufficient documentation

## 2023-05-12 DIAGNOSIS — E559 Vitamin D deficiency, unspecified: Secondary | ICD-10-CM | POA: Insufficient documentation

## 2023-05-12 DIAGNOSIS — J310 Chronic rhinitis: Secondary | ICD-10-CM | POA: Diagnosis not present

## 2023-05-12 DIAGNOSIS — R519 Headache, unspecified: Secondary | ICD-10-CM | POA: Insufficient documentation

## 2023-05-12 DIAGNOSIS — F411 Generalized anxiety disorder: Secondary | ICD-10-CM | POA: Insufficient documentation

## 2023-05-12 DIAGNOSIS — J849 Interstitial pulmonary disease, unspecified: Secondary | ICD-10-CM | POA: Insufficient documentation

## 2023-05-12 DIAGNOSIS — G5732 Lesion of lateral popliteal nerve, left lower limb: Secondary | ICD-10-CM | POA: Insufficient documentation

## 2023-05-12 DIAGNOSIS — D508 Other iron deficiency anemias: Secondary | ICD-10-CM | POA: Insufficient documentation

## 2023-05-12 DIAGNOSIS — R002 Palpitations: Secondary | ICD-10-CM | POA: Insufficient documentation

## 2023-05-12 DIAGNOSIS — Z6831 Body mass index (BMI) 31.0-31.9, adult: Secondary | ICD-10-CM | POA: Insufficient documentation

## 2023-05-12 DIAGNOSIS — R1314 Dysphagia, pharyngoesophageal phase: Secondary | ICD-10-CM | POA: Insufficient documentation

## 2023-05-12 DIAGNOSIS — H9193 Unspecified hearing loss, bilateral: Secondary | ICD-10-CM | POA: Insufficient documentation

## 2023-05-12 DIAGNOSIS — R600 Localized edema: Secondary | ICD-10-CM | POA: Insufficient documentation

## 2023-05-12 DIAGNOSIS — I272 Pulmonary hypertension, unspecified: Secondary | ICD-10-CM | POA: Insufficient documentation

## 2023-05-12 DIAGNOSIS — I700 Atherosclerosis of aorta: Secondary | ICD-10-CM | POA: Insufficient documentation

## 2023-05-12 DIAGNOSIS — R7303 Prediabetes: Secondary | ICD-10-CM | POA: Insufficient documentation

## 2023-05-12 DIAGNOSIS — R29898 Other symptoms and signs involving the musculoskeletal system: Secondary | ICD-10-CM | POA: Insufficient documentation

## 2023-05-12 DIAGNOSIS — E785 Hyperlipidemia, unspecified: Secondary | ICD-10-CM | POA: Insufficient documentation

## 2023-05-12 DIAGNOSIS — K2100 Gastro-esophageal reflux disease with esophagitis, without bleeding: Secondary | ICD-10-CM | POA: Insufficient documentation

## 2023-05-12 MED ORDER — OMEPRAZOLE 40 MG PO CPDR: DELAYED_RELEASE_CAPSULE | ORAL | Status: DC

## 2023-05-12 NOTE — Assessment & Plan Note (Addendum)
Onset of symptoms  spring 2017 RHC 05/18/16 with PA mean  17 and wedge 5 and CO  = 5.65  So PVR = 12/5.65 x 80 = 170 (wnl) Echo 10/31/17 :  Low nl with borderline global hypokinesis ef 50-55% and Mild LAE/ RVE / trace MR  - no change vs 04/28/16  - 11/04/2017  Walked RA x 3 laps @ 185 ft each stopped due to  End of study, fast pace, no sob or desat    - PFT's  12/09/2017   FVC 3.82 (90%)  No obst    p nothing prior to study with DLCO ( 19.63) 60 % corrects to (4.04) 87  % for alv volume   - 12/09/2017  Walked RA x 3 laps @ 185 ft each stopped due to  End of study, fast pace, no sob or desat    - ONO RA 12/12/17 desats < 89% x 26 min  - in absence of PH /symptoms do not rec rx  - HRCT 01/20/2018 1. Appearance of the lungs is compatible with interstitial lung disease. CT pattern is considered indeterminate for usual interstitial pneumonia (UIP). 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 - 01/24/2018  Walked RA x 3 laps @ 185 ft each stopped due to  End of study,fast  pace, no sob or desat    - 6 mw 07/31/2018   378 mild sob no desats - 05/08/2019   Walked RA  2 laps @  approx 297ft each @ avg pace  stopped due to  End of study min sob, no cp, sats 94% - PFT's  05/08/2019  FVC  3.74 (89%) prior to study with DLCO  16.92 (67%) corrects to 3.37 (85%)  for alv volume and FV curve s convexity    - Labs 05/08/19  :   Neg collagen vasc profile with esr 7,  HSP serology neg  - HRCT 06/01/19 :  Findings are not significantly changed compared to prior examinations dating back to 04/21/2016 remain non diagnostic  - 11/12/2019   Walked RA x two laps =  approx 558ft @ moderate pace - stopped due to end of study, no sob with sats of 96 % at the end of the study. - 10/16/2021   Walked on RA  x  3  lap(s) =  approx 750  ft  @ mod pace, stopped due to end of study  with lowest 02 sats 97%  - 02/09/2023    Walked on RA  x  2  lap(s) =  approx 500  ft  @ slow pace, stopped due to sob with lowest 02 sats 96%   - 05/12/2023   Walked on RA  x  3  lap(s) =  approx 450  ft  @ mod to brisk pace, stopped due to end of study  with lowest 02 sats 93%    No evidence of progression but I offered referral to PF clinic for any worsening doe/ sats/ he declined for now.  Rec: Check ESR to compare to prior values Make sure you check your oxygen saturation at your highest level of activity(NOT after you stop)  to be sure it stays over 90% and keep track of it at least once a week, more often if breathing getting worse, and let me know if losing ground. (Collect the dots to connect the dots approach)

## 2023-05-12 NOTE — Assessment & Plan Note (Signed)
Onset 2024 somewhat improved with clariton  - 05/12/2023 rec change to zyrtec - Allergy screen 05/12/2023 >  Eos 0. /  IgE    Most of the coughing is likely due to PF but there may be an upper airway component and if so try zytec and if not better could next try 1st gen H1 blockers per guidelines    Each maintenance medication was reviewed in detail including emphasizing most importantly the difference between maintenance and prns and under what circumstances the prns are to be triggered using an action plan format where appropriate.  Total time for H and P, chart review, counseling,  directly observing portions of ambulatory 02 saturation study/ and generating customized AVS unique to this office visit / same day charting = 32 min

## 2023-05-12 NOTE — Patient Instructions (Addendum)
Instead of clariton ok to try zyrtec over the counter an hour before bedtime to see if helps   Make sure you check your oxygen saturation at your highest level of activity(NOT after you stop)  to be sure it stays over 90% and keep track of it at least once a week, more often if breathing getting worse, and let me know if losing ground. (Collect the dots to connect the dots approach)    Please remember to go to the lab department   for your tests - we will call you with the results when they are available.      Please schedule a follow up visit in 6 months but call sooner if needed   .

## 2023-05-12 NOTE — Progress Notes (Addendum)
Subjective:     Patient ID: Troy Sanchez, male   DOB: 17-Feb-1941,    MRN: [id number]    Brief patient profile: 43 yowm never smoker with onset of doe around late spring  2017  > referred to [facility] who dx PH by [facility] 05/2016 and concerned about crackles on exam 10/31/17 so referred to pulmonary clinic 11/04/2017 by Dr   Nadara Eaton     History of Present Illness  11/04/2017 1st [facility] visit/ Troy Sanchez   Chief Complaint  Patient presents with   Pulmonary Consult    Referred by Dr. Jacinto Halim for eval of pulmonary hypertension.  He states he has had DOE for the past 2 yrs. He states he only gets winded when he walks up an incline.    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   Dyspnea:  Walking neighborhood 15-20 min and stops on steep hills, does not check sats as rec  Cough: none Sleeping: 10 degrees s resp symptoms  SABA use: none 02: none   rec Make sure you check your oxygen saturations at highest level of activity to be sure it stays over 90% and call me if trending downward    02/07/20  Dx of covid [facility]   - says took vaccine x one dose after initial infection s side effedts and? 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 No new co's today.   Dyspnea:  Walking dogs in [place] area with hills s stopping twice daily  Cough: none Sleeping: hosp bed is at 10 degrees s resp c/o's SABA use: none  02: none/ sats are  running no lower than 93-94%  Apparently took on vaccine ? 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   Dyspnea:  no problem with walking dogs/ hills  Cough: none unless having indigestion  Sleeping: electric bed up 10 degrees  SABA use: none  02: none  Rec Make sure you check your oxygen saturation at your highest level of activity to be sure it stays over 90%   GERD diet reviewed, bed blocks rec    02/09/2023  f/u ov/Troy Sanchez re: NSIP   maint on prilosec 40 mg  ac qAM only  Chief Complaint  Patient presents with   Follow-up    C/o increased sob gradually worsening x 1 year, dry cough, occass. Wheezing, headache, lightheaded at times  Dyspnea:  still walking dogs not checking sats  Cough: dry day > noct  Sleeping: electric bed 10 degrees  SABA use: none  02: none  Rec  Continue Omeprazole  40 mg   Take  30-60 min before first meal of the day and Pepcid (famotidine)  20 mg after supper until return to Sanchez   Make sure you check your oxygen saturation at your highest level of activity(NOT after you stop)  to be sure it stays over 90%        05/12/2023  f/u ov/Troy Sanchez/Troy Sanchez re: NSIP  maint on omeprazole 40 mg ac / pepcid 20 mg pc  and clariton q pm for pnds Chief Complaint  Patient presents with   Shortness of Breath  Dyspnea:  walking dogs/ some hills / has to stop while doing yard work 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   Sleeping as above  without nocturnal  or early am exacerbation  of  respiratory  c/o's or need for noct saba. Also denies any obvious fluctuation of symptoms with weather or environmental changes or other aggravating or alleviating factors except as outlined above   No unusual exposure hx or h/o childhood pna/ asthma or knowledge of premature birth.  Current Allergies, Complete Past Medical History, Past Surgical History, Family History, and Social History were reviewed in [employer] record.  ROS  The following are not active complaints unless bolded Hoarseness, sore throat, dysphagia, dental problems, itching, sneezing,  nasal congestion or discharge of excess mucus or purulent secretions, ear ache,   fever, chills, sweats, unintended wt loss or wt gain, classically pleuritic or exertional cp,  orthopnea pnd or arm/hand swelling  or leg swelling, presyncope, palpitations, abdominal pain, anorexia, nausea, vomiting, diarrhea  or change in bowel habits or change in bladder habits, change in stools or change in urine, dysuria, hematuria,  rash, arthralgias, visual complaints, headache, numbness, weakness or ataxia or problems with walking or coordination,  change in mood or  memory.        Current Meds  Medication Sig   aspirin EC 81 MG tablet Take 81 mg by mouth daily. Swallow whole.   atorvastatin (LIPITOR) 10 MG tablet Take 10 mg by mouth daily.   carboxymethylcellulose (REFRESH PLUS) 0.5 % SOLN 1 drop 3 (three) times daily as needed.   cholecalciferol (VITAMIN D) 1000 UNITS tablet Take 1,000 Units by mouth every morning.    famotidine (PEPCID) 20 MG tablet One after supper   isosorbide mononitrate (IMDUR) 60 MG 24 hr tablet Take 1 tablet (60 mg total) by mouth daily.   loratadine (CLARITIN) 10 MG tablet Take 10 mg by mouth daily.   magnesium oxide (MAG-OX) 400 MG tablet Take 400 mg by mouth daily.   Multiple Vitamin (MULTIVITAMIN WITH MINERALS) TABS tablet Take 1 tablet by mouth daily.   [DISCONTINUED] omeprazole (PRILOSEC) 40 MG  capsule TAKE 1 CAPSULE (40 MG TOTAL) BY MOUTH 2 (TWO) TIMES DAILY BEFORE A MEAL. (Patient taking differently: 40 mg daily.)                        Objective:   Physical Exam  Wts  05/12/2023         209 02/09/2023  208  10/16/2021         199  09/18/2020         177 11/12/2019          217  05/08/2019        217  07/31/2018      219  01/24/2018        212   12/09/2017        209   11/04/17 206 lb (93.4 kg)  05/18/16 215 lb (97.5 kg)  08/15/14 203 lb 14.8 oz (92.5 kg)   Vital signs reviewed  05/12/2023  - Note at rest 02 sats  94% on RA   General appearance:    pleasant amb wm nad     HEENT : Oropharynx  clear/ min watery pnd/ edentulous      Nasal turbinates nl/ no polyps viz   NECK :  without  apparent JVD/ palpable Nodes/TM    LUNGS: no acc muscle use, slt kyphotic contour with crackles in bases and cough on insp maneuvers  CV:  RRR  no s3 or murmur or increase in P2, and trace edema R > L  ABD: mod obese but soft and nontender     MS:  Nl gait/ ext warm without deformities Or obvious joint restrictions  calf tenderness, cyanosis - No clubbing   SKIN: warm and dry without lesions    NEURO:  alert, approp, nl sensorium with  no motor or cerebellar deficits apparent.    LUNGS: no acc muscle use, Slt kyphotic  contour chest  with a few coarse crackles both bases and cough on inspiration        Lab Results  Component Value Date   ESRSEDRATE 8 02/09/2023   ESRSEDRATE 7 05/08/2019     Assessment:

## 2023-05-17 ENCOUNTER — Telehealth: Payer: Self-pay | Admitting: Internal Medicine

## 2023-05-17 NOTE — Telephone Encounter (Signed)
See result note.  

## 2023-05-26 ENCOUNTER — Ambulatory Visit: Payer: Medicare Other | Admitting: Cardiology

## 2023-05-26 ENCOUNTER — Encounter: Payer: Self-pay | Admitting: Cardiology

## 2023-05-26 VITALS — BP 122/79 | HR 67 | Resp 16 | Ht 70.0 in | Wt 209.0 lb

## 2023-05-26 DIAGNOSIS — I10 Essential (primary) hypertension: Secondary | ICD-10-CM

## 2023-05-26 DIAGNOSIS — J849 Interstitial pulmonary disease, unspecified: Secondary | ICD-10-CM | POA: Diagnosis not present

## 2023-05-26 DIAGNOSIS — R0602 Shortness of breath: Secondary | ICD-10-CM | POA: Diagnosis not present

## 2023-05-26 DIAGNOSIS — I351 Nonrheumatic aortic (valve) insufficiency: Secondary | ICD-10-CM

## 2023-05-26 NOTE — Progress Notes (Signed)
Primary Physician:  Merri Brunette, MD   Patient ID: Troy Sanchez, male    DOB: 18-Sep-1941, 82 y.o.   MRN: [id number]  Subjective:    Chief Complaint  Patient presents with   Shortness of Breath   Follow-up    3 month   Results    echo    HPI: Troy Sanchez  is a 82 y.o. male   male  with hypertension, hyperlipidemia, moderate CAD (cardiac catheterization in 2017 mid LAD 50% stenosis otherwise no significant disease), mild pulmonary hypertension, chronic dyspnea and also pulmonary scarring noted on CT scan performed in 2019, esophageal strictures status post dilatation in the past, chronic dyspnea presents here  for follow-up of dyspnea.  He underwent echocardiogram.  Patient states that especially over the past 6 months and especially in the last 2 to 3 months his dyspnea has gotten worse.  Denies PND or orthopnea.  Even activities of daily living brings on dyspnea.  He also complains of marked daytime somnolence, sleeping inappropriately.  Patient still continues to work full-time at [employer], however unable to do any physical exertion activities without dyspnea.  No leg edema, no associated chest pain, no syncope.  Past Medical History:  Diagnosis Date   CAD (coronary artery disease) 07/02/2019   Esophageal motility disorder    Esophageal stenosis    "torturous esophagus"   GERD (gastroesophageal reflux disease) 01/07/2021   Hiatal hernia    HTN (hypertension) 07/02/2019   Hypertension    Recurrent Clostridium difficile diarrhea    Zenker diverticulum    Past Surgical History:  Procedure Laterality Date   BELPHAROPTOSIS REPAIR Bilateral    CARDIAC CATHETERIZATION N/A 05/18/2016   Procedure: Right/Left Heart Cath and Coronary Angiography;  Surgeon: Yates Decamp, MD;  Location: [facility] INVASIVE CV LAB;  Service: Cardiovascular;  Laterality: N/A;   CATARACT EXTRACTION Bilateral    CHOLECYSTECTOMY  10/11/2004   GASTROSTOMY N/A 03/06/2014   Procedure: GASTROSTOMY;  Surgeon:  Cherylynn Ridges, MD;  Location: [facility] OR;  Service: General;  Laterality: N/A;   HIATAL HERNIA REPAIR N/A 03/06/2014   Procedure: HERNIA REPAIR HIATAL;  Surgeon: Cherylynn Ridges, MD;  Location: [facility] OR;  Service: General;  Laterality: N/A;   LAPAROTOMY N/A 03/06/2014   Procedure: EXPLORATORY LAPAROTOMY;  Surgeon: Cherylynn Ridges, MD;  Location: [facility] OR;  Service: General;  Laterality: N/A;   TRANSURETHRAL RESECTION OF PROSTATE N/A 08/15/2014   Procedure: TRANSURETHRAL RESECTION OF THE PROSTATE WITH GYRUS INSTRUMENTS;  Surgeon: Anner Crete, MD;  Location: [facility] ORS;  Service: Urology;  Laterality: N/A;   UMBILICAL HERNIA REPAIR     Family History  Problem Relation Age of Onset   Colon cancer Neg Hx    Esophageal cancer Neg Hx    Stomach cancer Neg Hx    Pancreatic cancer Neg Hx    Liver disease Neg Hx    Social History   Tobacco Use   Smoking status: Never   Smokeless tobacco: Never  Substance Use Topics   Alcohol use: Yes    Comment: ocassionally   Marital Status: Married  ROS  Review of Systems  Cardiovascular:  Positive for dyspnea on exertion and leg swelling (chronic, minimal). Negative for chest pain, claudication, near-syncope, orthopnea, palpitations, paroxysmal nocturnal dyspnea and syncope.   Objective:  Blood pressure 122/79, pulse 67, resp. rate 16, height 5\' 10"  (1.778 m), weight 209 lb (94.8 kg), SpO2 96%. Body mass index is 29.99 kg/m.    Physical Exam Vitals reviewed.  Constitutional:      Appearance: He is well-developed.  Neck:     Vascular: No carotid bruit or JVD.  Cardiovascular:     Rate and Rhythm: Normal rate and regular rhythm.     Pulses: Normal pulses and intact distal pulses.     Heart sounds: Normal heart sounds, S1 normal and S2 normal. No murmur heard.    No gallop.  Pulmonary:     Effort: Pulmonary effort is normal. No accessory muscle usage.     Breath sounds: Rales (bilateral bases, left > Right) present.  Abdominal:     General: Bowel sounds are  normal.     Palpations: Abdomen is soft.  Musculoskeletal:     Right lower leg: Edema (trace ankle) present.     Left lower leg: Edema (trace ankle) present.    Laboratory examination:   Lab Results  Component Value Date   NA 137 03/05/2022   K 4.9 03/05/2022   CO2 24 03/05/2022   GLUCOSE 110 (H) 03/05/2022   BUN 11 03/05/2022   CREATININE 1.29 (H) 03/05/2022   CALCIUM 9.5 03/05/2022   EGFR 56 (L) 03/05/2022   GFRNONAA 43 (L) 06/09/2021       Latest Ref Rng & Units 03/05/2022    9:46 AM 06/09/2021   12:10 AM 08/21/2020    1:35 PM  CMP  Glucose 70 - 99 mg/dL 161  096  045   BUN 8 - 27 mg/dL 11  21  12    Creatinine 0.76 - 1.27 mg/dL 4.09  8.11  9.14   Sodium 134 - 144 mmol/L 137  137  139   Potassium 3.5 - 5.2 mmol/L 4.9  4.8  3.9   Chloride 96 - 106 mmol/L 99  104  103   CO2 20 - 29 mmol/L 24  29  27    Calcium 8.6 - 10.2 mg/dL 9.5  9.2  9.2   Total Protein 6.5 - 8.1 g/dL  6.6  6.3   Total Bilirubin 0.3 - 1.2 mg/dL  1.3  2.0   Alkaline Phos 38 - 126 U/L  54  61   AST 15 - 41 U/L  24  23   ALT 0 - 44 U/L  14  14       Latest Ref Rng & Units 05/12/2023    9:16 AM 02/09/2023    9:33 AM 06/09/2021   12:10 AM  CBC  WBC 3.4 - 10.8 x10E3/uL 4.4  4.5  7.0   Hemoglobin 13.0 - 17.7 g/dL 78.2  95.6  21.3   Hematocrit 37.5 - 51.0 % 39.6  40.6  42.6   Platelets 150 - 450 x10E3/uL 199  173.0  143       Component Value Date/Time   BNP 17.1 03/05/2022 0946   External Labs:   Labs 01/06/2023:  Total cholesterol 106, triglycerides 75, HDL 33, LDL 58.  TSH normal at 3.77.  Hb 12.8/HCT 39.4, platelets 183, normal indicis.  Sodium 138, potassium 4.6, BUN 10, creatinine 1.14, EGFR 60 mL, LFTs normal.  Urine analysis normal.  Radiology   No results found.  High-resolution CT chest 06/01/2019: 1. There is a redemonstrated pattern of mild pulmonary fibrosis with an apical to basal gradient featuring irregular peripheral interstitial opacity and minimal associated ground-glass.  Mild, predominantly tubular bronchiectasis. No significant air trapping on expiratory phase imaging. Findings are not significantly changed compared to prior examinations dating back to 04/21/2016 and remain consistent with an "indeterminate for UIP" pattern by  ATS pulmonary fibrosis criteria. Per ordering indication, patient has an established diagnosis of IPF.  2.  Coronary artery disease  Chest x-ray 03/06/2022: Comparison 10/16/2021: Transverse diameter of heart is increased. There is poor inspiration. Increased interstitial markings are seen in the both lungs. There are no new focal infiltrates or signs of alveolar pulmonary edema. Lateral CP angles are indistinct. There is no pneumothorax.   IMPRESSION: Interstitial lung disease. There are no signs of pulmonary edema or new focal infiltrates.  Blunting of both lateral CP angles may suggest pleural thickening or minimal effusions.   Cardiac Studies:  Heart Cath 05/18/2016: Mild pulmonary hypertension PA 37/15, mean 25 mmHg. Wedge low at 10 mm Hg. CI decreased. Moderate CAD, Mid LAD 50% and Mid to dist Cx 50%. 30% Mid RCA. LVEF 50-55%.  Zio patch cardiac monitor 3 days (03/15/2022 - 03/18/2022): Predominant underlying rhythm was sinus with BBB present. Min HR of 48 bpm, max HR of 94 bpm, and avg HR of 62 bpm.  Rare PACs and rare PVCs, ventricular bigeminy was present.  There were no patient triggered events.  No evidence of significant cardiac arrhythmias.  Lexiscan (with Mod Bruce protocol) Nuclear stress test 03/15/2022: Nondiagnostic ECG stress. The heart rate response was consistent with Regadenoson.  Myocardial perfusion reveals diaphragmatic attenuation. Minimal ischemia in this region cannot be excluded. Overall LV systolic function is normal without regional wall motion abnormalities. Stress LV EF: 57%.  No previous exam available for comparison. Low risk.   PCV ECHOCARDIOGRAM COMPLETE 03/09/2023  Narrative Echocardiogram  03/09/2023: 5Left ventricle cavity is normal in size and ventricular wall thickness. Normal global wall motion. Normal LV systolic function with EF 66%. Doppler evidence of grade I (impaired) diastolic dysfunction, normal LAP. Left atrial cavity is mildly dilated. Structurally normal trileaflet aortic valve. Mild (Grade I) aortic regurgitation. No evidence of pulmonary hypertension. No significant change from  11/24/2021    EKG:   EKG 02/02/2023: Normal sinus rhythm with rate of 67 bpm, left axis deviation, left anterior fascicular block.  Right bundle branch block.  Poor R progression, cannot exclude anterolateral infarct old.  Nonspecific T abnormality.  Compared to 11/16/2021, no change. Compared EKG 11/14/2020, no significant change.   Allergies & Meds:   No Known Allergies   Current Outpatient Medications:    aspirin EC 81 MG tablet, Take 81 mg by mouth daily. Swallow whole., Disp: , Rfl:    atorvastatin (LIPITOR) 10 MG tablet, Take 10 mg by mouth daily., Disp: , Rfl:    carboxymethylcellulose (REFRESH PLUS) 0.5 % SOLN, 1 drop 3 (three) times daily as needed., Disp: , Rfl:    cholecalciferol (VITAMIN D) 1000 UNITS tablet, Take 1,000 Units by mouth every morning. , Disp: , Rfl:    famotidine (PEPCID) 20 MG tablet, One after supper, Disp: 90 tablet, Rfl: 3   isosorbide mononitrate (IMDUR) 60 MG 24 hr tablet, Take 1 tablet (60 mg total) by mouth daily., Disp: 90 tablet, Rfl: 3   loratadine (CLARITIN) 10 MG tablet, Take 10 mg by mouth daily., Disp: , Rfl:    magnesium oxide (MAG-OX) 400 MG tablet, Take 400 mg by mouth daily., Disp: , Rfl:    Multiple Vitamin (MULTIVITAMIN WITH MINERALS) TABS tablet, Take 1 tablet by mouth daily., Disp: 30 tablet, Rfl: 0   omeprazole (PRILOSEC) 40 MG capsule, Take 30-60 min before first meal of the day, Disp: , Rfl:    Assessment:     ICD-10-CM   1. Shortness of breath  R06.02  2. ILD (interstitial lung disease) (HCC)  J84.9     3. Essential  hypertension  I10     4. Mild aortic regurgitation  I35.1      No orders of the defined types were placed in this encounter.  There are no discontinued medications.  Recommendations:  Troy Sanchez  is a 82 y.o. male  with hypertension, hyperlipidemia, moderate CAD (cardiac catheterization in 2017 mid LAD 50% stenosis otherwise no significant disease), mild pulmonary hypertension, chronic dyspnea and also pulmonary scarring noted on CT scan performed in 2019, esophageal strictures status post dilatation in the past, chronic dyspnea presents here for 25-month follow-up of dyspnea on exertion.  1. Shortness of breath I do not think cardiac etiology for his dyspnea, a combination of obesity, interstitial lung disease, age-related deconditioning could be contributing to his dyspnea in the absence of pulmonary hypertension or changing aortic valve regurgitation and preserved LVEF.  I have discussed with the patient to join an exercise program.  2. ILD (interstitial lung disease) (HCC) As dictated above.  No evidence of pulmonary hypertension.  Blood pressure is well-controlled on present medical regimen  3. Essential hypertension Blood pressure is well-controlled on present medical regimen, not sure he truly has hypertension as he is only on isosorbide mononitrate 60 mg daily.  Could consider discontinuing this and switching to an ARB.  He does have stage IIIa chronic kidney disease hence I did not make any changes but will forward my office visit note to Dr. Renne Crigler for consideration.  4. Mild aortic regurgitation No significant change in valvular heart disease and do not think this is the etiology for his dyspnea.  I will see him back in a year.    Yates Decamp, MD, [facility] [place] 05/26/2023, 9:23 AM Office: [phone] Fax: [phone] Pager: [phone]

## 2023-09-14 DIAGNOSIS — H26493 Other secondary cataract, bilateral: Secondary | ICD-10-CM | POA: Diagnosis not present

## 2023-09-14 DIAGNOSIS — H5203 Hypermetropia, bilateral: Secondary | ICD-10-CM | POA: Diagnosis not present

## 2023-10-26 ENCOUNTER — Other Ambulatory Visit: Payer: Self-pay | Admitting: Gastroenterology

## 2023-11-08 ENCOUNTER — Ambulatory Visit: Payer: Medicare Other | Admitting: Internal Medicine

## 2023-12-31 NOTE — Progress Notes (Unsigned)
 Subjective:    Patient ID: Troy Sanchez, male   DOB: Jun 29, 1941     MRN: [id number]    Brief patient profile: 47 yowm never smoker with onset of doe around late spring  2017  > referred to Dr Nadara Eaton who dx [facility] by [facility] 05/2016 and concerned about crackles on exam 10/31/17 so referred to pulmonary clinic 11/04/2017 by Dr   Nadara Eaton    History of Present Illness  11/04/2017 1st [place] [facility] office visit/ Troy Sanchez   Chief Complaint  Patient presents with   Pulmonary Consult    Referred by Dr. Jacinto Halim for eval of pulmonary hypertension.  He states he has had DOE for the past 2 yrs. He states he only gets winded when he walks up an incline.    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   Dyspnea:  Walking neighborhood 15-20 min and stops on steep hills, does not check sats as rec  Cough: none Sleeping: 10 degrees s resp symptoms  SABA use: none 02: none   rec Make sure you check your oxygen saturations at highest level of activity to be sure it stays over 90% and call me if trending downward    02/07/20  Dx of covid minute clinic   - says took vaccine x one dose after initial infection s side effedts and? August 2021 (not documented in epic)  but afraid to take any more due to "what he's heard"          02/09/2023  f/u ov/Troy Sanchez re: NSIP   maint on prilosec 40 mg  ac qAM only  Chief Complaint  Patient presents with   Follow-up    C/o increased sob gradually worsening x 1 year, dry cough, occass. Wheezing, headache, lightheaded at times  Dyspnea:  still walking dogs not checking sats  Cough: dry day > noct  Sleeping: electric bed 10 degrees  SABA use: none   02: none  Rec Continue Omeprazole  40 mg   Take  30-60 min before first meal of the day and Pepcid (famotidine)  20 mg after supper until return to office   Make sure you check your oxygen saturation at your highest level of activity(NOT after you stop)  to be sure it stays over 90%    03/09/23  diastolic dysfunction grade 1 / LA mildly dilated, no PH   05/12/2023  f/u ov/[place] office/Troy Sanchez re: NSIP  maint on omeprazole 40 mg ac / pepcid 20 mg pc  and clariton q pm for pnds Chief Complaint  Patient presents with   Shortness of Breath  Dyspnea:  walking dogs/ some hills / has to stop while doing yard work 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, manufacturing systems  Cough: hoarse / lots of throat clearing some better on clariton / min mucoid production  Sleeping: 10 degrees HOB no cc SABA use: none  02: none  Rec Instead of clariton ok to try zyrtec over the counter an hour before bedtime to see if helps  Make sure you check your oxygen saturation at your highest  level of activity(NOT after you stop)  to be sure it stays over 90%  Labs:  Allergy screen 05/12/2023 >  Eos 0.1 /  IgE  37 Lab Results  Component Value Date   HGB 12.9 (L) 05/12/2023   HGB 13.3 02/09/2023   HGB 14.2 06/09/2021   HGB 11.5 (L) 08/21/2020     Lab Results  Component Value Date   ESRSEDRATE 5 05/12/2023   ESRSEDRATE 8 02/09/2023   ESRSEDRATE 7 05/08/2019     01/02/2024  f/u ov/[place] office/Troy Sanchez 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  Dyspnea:  no longer walking dog/ yard work 10-15 min then tired and cough  Cough: ? Better cough since changed to zyrtec  Sleeping: 10-15  degrees hob   resp cc  SABA use: none  02: none      No obvious day to day or daytime variability or assoc excess/ purulent sputum or mucus plugs or hemoptysis or cp or chest tightness, subjective wheeze or overt sinus or hb symptoms.    Also denies any obvious fluctuation of  symptoms with weather or environmental changes or other aggravating or alleviating factors except as outlined above   No unusual exposure hx or h/o childhood pna/ asthma or knowledge of premature birth.  Current Allergies, Complete Past Medical History, Past Surgical History, Family History, and Social History were reviewed in [employer] record.  ROS  The following are not active complaints unless bolded Hoarseness, sore throat, dysphagia, dental problems, itching, sneezing,  nasal congestion or discharge of excess mucus or purulent secretions, ear ache,   fever, chills, sweats, unintended wt loss or wt gain, classically pleuritic or exertional cp,  orthopnea pnd or arm/hand swelling  or leg swelling, presyncope, palpitations, abdominal pain, anorexia, nausea, vomiting, diarrhea  or change in bowel habits or change in bladder habits, change in stools or change in urine, dysuria, hematuria,  rash, arthralgias, visual complaints, headache, numbness, weakness or ataxia or problems with walking or coordination,  change in mood or  memory.        Current Meds  Medication Sig   aspirin EC 81 MG tablet Take 81 mg by mouth daily. Swallow whole.   atorvastatin (LIPITOR) 10 MG tablet Take 10 mg by mouth daily.   carboxymethylcellulose (REFRESH PLUS) 0.5 % SOLN 1 drop 3 (three) times daily as needed.   cetirizine (ZYRTEC) 10 MG tablet Take 10 mg by mouth daily.   cholecalciferol (VITAMIN D) 1000 UNITS tablet Take 1,000 Units by mouth every morning.    famotidine (PEPCID) 20 MG tablet One after supper   isosorbide mononitrate (IMDUR) 60 MG 24 hr tablet Take 1 tablet (60 mg total) by mouth daily.   magnesium oxide (MAG-OX) 400 MG tablet Take 400 mg by mouth daily.   Multiple Vitamin (MULTIVITAMIN WITH MINERALS) TABS tablet Take 1 tablet by mouth daily.   omeprazole (PRILOSEC) 40 MG capsule TAKE 1 CAPSULE (40 MG TOTAL) BY MOUTH DAILY.                     Objective:   Physical  Exam  Wts  01/02/2024       206  05/12/2023         209 02/09/2023         208  10/16/2021         199  09/18/2020  177 11/12/2019          217  05/08/2019        217  07/31/2018      219  01/24/2018        212   12/09/2017        209   11/04/17 206 lb (93.4 kg)  05/18/16 215 lb (97.5 kg)  08/15/14 203 lb 14.8 oz (92.5 kg)   Vital signs reviewed  01/02/2024  - Note at rest 02 sats  94% on RA   General appearance:   amb eldelry wm nad     HEENT : Oropharynx  / edentulous      Nasal turbinates nl    NECK :  without  apparent JVD/ palpable Nodes/TM    LUNGS: no acc muscle use,  mildly kyphotic contour chest with insp crackles 1/3 up but no cough on inspiration    CV:  RRR  no s3 or murmur or increase in P2, and no edema   ABD:  soft and nontender   MS:  Gait nl   ext warm without deformities Or obvious joint restrictions  calf tenderness, cyanosis or clubbing    SKIN: warm and dry without lesions    NEURO:  alert, approp, nl sensorium with  no motor or cerebellar deficits apparent.        Assessment:

## 2024-01-02 ENCOUNTER — Encounter: Payer: Self-pay | Admitting: Internal Medicine

## 2024-01-02 ENCOUNTER — Ambulatory Visit: Payer: Medicare Other | Admitting: Internal Medicine

## 2024-01-02 VITALS — BP 132/84 | HR 67 | Ht 70.0 in | Wt 206.0 lb

## 2024-01-02 DIAGNOSIS — J8489 Other specified interstitial pulmonary diseases: Secondary | ICD-10-CM

## 2024-01-02 DIAGNOSIS — J310 Chronic rhinitis: Secondary | ICD-10-CM | POA: Diagnosis not present

## 2024-01-02 NOTE — Patient Instructions (Signed)
Make sure you check your oxygen saturation at your highest level of activity(NOT after you stop)  to be sure it stays over 90% and keep track of it at least once a week, more often if breathing getting worse, and let me know if losing ground. (Collect the dots to connect the dots approach)     Please schedule a follow up visit in 12  months but call sooner if needed

## 2024-01-03 NOTE — Assessment & Plan Note (Signed)
 Onset of symptoms  spring 2017 [facility] 05/18/16 with PA mean  17 and wedge 5 and CO  = 5.65  So PVR = 12/5.65 x 80 = 170 (wnl) Echo 10/31/17 :  Low nl with borderline global hypokinesis ef 50-55% and Mild LAE/ RVE / trace MR  - no change vs 04/28/16  - 11/04/2017  Walked RA x 3 laps @ 185 ft each stopped due to  End of study, fast pace, no sob or desat    - PFT's  12/09/2017   FVC 3.82 (90%)  No obst    p nothing prior to study with DLCO ( 19.63) 60 % corrects to (4.04) 87  % for alv volume   - 12/09/2017  Walked RA x 3 laps @ 185 ft each stopped due to  End of study, fast pace, no sob or desat    - ONO RA 12/12/17 desats < 89% x 26 min  - in absence of PH /symptoms do not rec rx  - HRCT 01/20/2018 1. Appearance of the lungs is compatible with interstitial lung disease. CT pattern is considered indeterminate for usual interstitial pneumonia (UIP). 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 - 01/24/2018  Walked RA x 3 laps @ 185 ft each stopped due to  End of study,fast  pace, no sob or desat    - 6 mw 07/31/2018   378 mild sob no desats - 05/08/2019   Walked RA  2 laps @  approx 28ft each @ avg pace  stopped due to  End of study min sob, no cp, sats 94% - PFT's  05/08/2019  FVC  3.74 (89%) prior to study with DLCO  16.92 (67%) corrects to 3.37 (85%)  for alv volume and FV curve s convexity    - Labs 05/08/19  :   Neg collagen vasc profile with esr 7,  HSP serology neg  - HRCT 06/01/19 :  Findings are not significantly changed compared to prior examinations dating back to 04/21/2016 remain non diagnostic  - 11/12/2019   Walked RA x two laps =  approx 565ft @ moderate pace - stopped due to end of study, no sob with sats of 96 % at the end of the study. - 10/16/2021   Walked on RA  x  3  lap(s) =  approx 750  ft  @ mod pace, stopped due to end of study  with lowest 02 sats 97%  - 02/09/2023    Walked on RA  x  2  lap(s) =  approx 500  ft  @ slow pace, stopped due to sob with lowest 02 sats 96%   - 05/12/2023   Walked on RA  x  3  lap(s) =  approx 450  ft  @ mod to brisk pace, stopped due to end of study  with lowest 02 sats 93%    01/02/2024   Walked on RA   x  3  lap(s) =  approx 450  ft  @  mod  pace, stopped due to end of study  with lowest 02 sats 94% s sob    No clinical progression of dz   Advised: Make sure you check your oxygen saturation at your highest level of activity(NOT after you stop)  to be sure it stays over 90% and keep track of it at least once a week, more often if breathing  getting worse, and let me know if losing ground. (Collect the dots to connect the dots approach)    F/u can be yearly, sooner prn   Each maintenance medication was reviewed in detail including emphasizing most importantly the difference between maintenance and prns and under what circumstances the prns are to be triggered using an action plan format where appropriate.  Total time for H and P, chart review, counseling,  directly observing portions of ambulatory 02 saturation study/ and generating customized AVS unique to this office visit / same day charting = 31 min

## 2024-01-04 DIAGNOSIS — R7309 Other abnormal glucose: Secondary | ICD-10-CM | POA: Diagnosis not present

## 2024-01-04 DIAGNOSIS — E785 Hyperlipidemia, unspecified: Secondary | ICD-10-CM | POA: Diagnosis not present

## 2024-01-09 DIAGNOSIS — R413 Other amnesia: Secondary | ICD-10-CM | POA: Diagnosis not present

## 2024-01-09 DIAGNOSIS — Z8619 Personal history of other infectious and parasitic diseases: Secondary | ICD-10-CM | POA: Diagnosis not present

## 2024-01-09 DIAGNOSIS — Z0000 Encounter for general adult medical examination without abnormal findings: Secondary | ICD-10-CM | POA: Diagnosis not present

## 2024-01-09 DIAGNOSIS — I2510 Atherosclerotic heart disease of native coronary artery without angina pectoris: Secondary | ICD-10-CM | POA: Diagnosis not present

## 2024-01-09 DIAGNOSIS — R1314 Dysphagia, pharyngoesophageal phase: Secondary | ICD-10-CM | POA: Diagnosis not present

## 2024-01-09 DIAGNOSIS — R7303 Prediabetes: Secondary | ICD-10-CM | POA: Diagnosis not present

## 2024-01-09 DIAGNOSIS — J849 Interstitial pulmonary disease, unspecified: Secondary | ICD-10-CM | POA: Diagnosis not present

## 2024-02-01 ENCOUNTER — Other Ambulatory Visit: Payer: Self-pay | Admitting: Cardiology

## 2024-03-15 DIAGNOSIS — E538 Deficiency of other specified B group vitamins: Secondary | ICD-10-CM | POA: Diagnosis not present

## 2024-04-16 ENCOUNTER — Other Ambulatory Visit: Payer: Self-pay | Admitting: Physician Assistant

## 2024-04-25 ENCOUNTER — Telehealth: Payer: Self-pay

## 2024-04-25 NOTE — Telephone Encounter (Signed)
 Copied from CRM [id number]. Topic: Clinical - Medical Advice >> Apr 25, 2024 10:38 AM Corean SAUNDERS wrote: Reason for CRM:  Patient would like to let Dr. Darlean know that when he's working in the yard his oxygen drops to 84 and his heart rate is at 104.  I called and spoke to pt. Pt denies being on O2 and states he checks his O2 as soon as he starts to feel bad. Pt states he is not sure if Dr LELON ert wants to see him sooner or wait til he is scheduled for a f/u.   Dr Darlean, please advise.

## 2024-04-26 NOTE — Telephone Encounter (Signed)
 I called and spoke to Troy Sanchez. Troy Sanchez informed of Dr Chari message and verbalized understanding. Routing to the front desk for scheduling. NFN

## 2024-04-26 NOTE — Telephone Encounter (Signed)
 Patient is scheduled 8/15 w/ Wert.

## 2024-05-22 ENCOUNTER — Telehealth: Payer: Self-pay | Admitting: Internal Medicine

## 2024-05-22 NOTE — Telephone Encounter (Signed)
 Spoke with patient regarding new appointment time for Wednesday 07/04/24--appointment moved from 4:00 pm to 8:30 am due to schedule change for Dr. Darlean.  Will mail new information to patient and he voiced his understanding

## 2024-05-24 ENCOUNTER — Ambulatory Visit: Payer: Medicare Other | Admitting: Cardiology

## 2024-05-25 ENCOUNTER — Ambulatory Visit: Admitting: Internal Medicine

## 2024-07-01 NOTE — Progress Notes (Signed)
 Subjective:    Patient ID: Troy Sanchez, male   DOB: 30-Mar-1941     MRN: [id number]    Brief patient profile: 38 yowm never smoker with onset of doe around late spring  2017  > referred to Dr Margaretann who dx [facility] by [facility] 05/2016 and concerned about crackles on exam 10/31/17 so referred to pulmonary clinic 11/04/2017 by Dr   Margaretann with ultimate dx of NSIP    History of Present Illness  11/04/2017 1st [place] [facility] office visit/ Troy Sanchez   Chief Complaint  Patient presents with   Pulmonary Consult    Referred by Dr. Ladona for eval of pulmonary hypertension.  He states he has had DOE for the past 2 yrs. He states he only gets winded when he walks up an incline.    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 Wheezing, headache, lightheaded at times  Dyspnea:  still walking dogs not checking sats  Cough: dry day > noct  Sleeping: electric bed 10 degrees  SABA use: none  02: none  Rec Continue Omeprazole   40 mg   Take  30-60 min before first meal of the day and Pepcid  (famotidine )  20 mg after supper until return to office   Make sure you check your oxygen saturation at your highest level of activity(NOT after you stop)  to be sure it stays over 90%    03/09/23   ECHO diastolic dysfunction grade 1 / LA mildly dilated, no PH   05/12/2023  f/u ov/[place] office/Troy Sanchez re: NSIP  maint on omeprazole  40 mg ac / pepcid  20 mg pc  and clariton q pm for pnds Chief Complaint  Patient presents with   Shortness of Breath  Dyspnea:  walking dogs/ some hills / has to stop while doing yard work 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, manufacturing systems   Cough: hoarse / lots of throat clearing some better on clariton / min mucoid production  Sleeping: 10 degrees HOB no cc SABA use: none  02: none  Rec Instead of clariton ok to try zyrtec over the counter an hour before bedtime to see if helps  Make sure you check your oxygen saturation at your highest level of activity(NOT after you stop)  to be sure it stays over 90%  Labs:  Allergy screen 05/12/2023 >  Eos 0.1 /  IgE  37 Lab Results  Component Value Date   HGB 12.9 (L) 05/12/2023   HGB 13.3 02/09/2023   HGB 14.2 06/09/2021   HGB 11.5 (L) 08/21/2020     Lab Results  Component Value Date   ESRSEDRATE 5 05/12/2023   ESRSEDRATE 8 02/09/2023   ESRSEDRATE 7 05/08/2019     01/02/2024  f/u ov/[place] office/Troy Sanchez 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 Better cough since changed to zyrtec  Sleeping:  10-15  degrees hob   resp cc  SABA use: none  02: none  Rec Make sure you check your oxygen saturation at your highest level of activity(NOT after you stop)  to be sure it stays over 90% and keep track of it at least once a week, more often if breathing getting worse, and let me know if losing ground. 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    Also denies any obvious fluctuation of symptoms with weather or environmental changes or other aggravating or alleviating factors except as outlined above   No unusual exposure hx or h/o childhood pna/ asthma or knowledge of premature birth.  Current Allergies, Complete Past Medical History, Past Surgical History, Family History, and Social History were reviewed in [employer] record.  ROS  The following are not active complaints unless bolded Hoarseness, sore throat, dysphagia, dental problems, itching, sneezing,  nasal congestion or discharge of excess mucus or purulent secretions, ear ache,   fever, chills, sweats, unintended wt loss or wt gain, classically pleuritic or exertional cp,  orthopnea pnd or arm/hand swelling  or leg swelling, presyncope, palpitations, abdominal pain, anorexia, nausea, vomiting, diarrhea  or change in bowel habits or change in bladder habits, change in stools or change in urine, dysuria, hematuria,  rash, arthralgias, visual complaints (classic AF) , headache, numbness, weakness or ataxia or problems with walking or coordination,  change in mood or  memory.        Current Meds  Medication Sig   aspirin  EC 81 MG tablet Take 81 mg by mouth daily. Swallow whole.   atorvastatin  (LIPITOR) 10 MG tablet Take 10 mg by mouth daily.   carboxymethylcellulose (REFRESH PLUS) 0.5 % SOLN 1 drop 3 (three) times daily as needed.   cetirizine (ZYRTEC) 10 MG tablet Take 10 mg by mouth daily.   cholecalciferol  (VITAMIN D ) 1000 UNITS tablet Take 1,000 Units by mouth every morning.    famotidine  (PEPCID ) 20 MG tablet ONE AFTER SUPPER   isosorbide  mononitrate (IMDUR ) 60 MG 24 hr tablet TAKE 1 TABLET BY MOUTH EVERY DAY   magnesium  oxide (MAG-OX) 400 MG tablet Take 400 mg by mouth daily.   Multiple Vitamin (MULTIVITAMIN WITH MINERALS) TABS tablet Take 1 tablet by mouth daily.   omeprazole  (PRILOSEC) 40 MG  capsule TAKE 1 CAPSULE (40 MG TOTAL) BY MOUTH DAILY.              Objective:   Physical Exam  Wts  07/04/2024       212  01/02/2024       206  05/12/2023  209 02/09/2023         208  10/16/2021         199  09/18/2020         177 11/12/2019          217  05/08/2019        217  07/31/2018      219  01/24/2018        212   12/09/2017        209   11/04/17 206 lb (93.4 kg)  05/18/16 215 lb (97.5 kg)  08/15/14 203 lb 14.8 oz (92.5 kg)    Vital signs reviewed  07/04/2024  - Note at rest 02 sats  99% on RA   General appearance:    elderly somewhat frail wm nad      HEENT : Oropharynx  clear/ edentulous      Nasal turbinates nl / ears cear nl TMs   NECK :  without  apparent JVD/ palpable Nodes/TM    LUNGS: no acc muscle use,  Nl contour chest with insp crackles both bases with min cough on insp   CV:  RRR  no s3 or murmur or increase in P2, and no edema   ABD:  soft and nontender   MS:  Gait nl   ext warm without deformities Or obvious joint restrictions  calf tenderness, cyanosis or clubbing    SKIN: warm and dry without lesions    NEURO:  alert, approp, nl sensorium with  no motor or cerebellar deficits apparent.      CXR PA and Lateral:   07/04/2024 :    I personally reviewed images and impression is as follows:  No interval change atypical fibrotic pattern      Labs ordered/ reviewed:      Chemistry      Component Value Date/Time   NA 136 07/04/2024 0903   K 4.8 07/04/2024 0903   CL 98 07/04/2024 0903   CO2 24 07/04/2024 0903   BUN 9 07/04/2024 0903   CREATININE 1.47 (H) 07/04/2024 0903      Component Value Date/Time   CALCIUM  9.4 07/04/2024 0903   ALKPHOS 100 07/04/2024 0903   AST 22 07/04/2024 0903   ALT 11 07/04/2024 0903   BILITOT 0.8 07/04/2024 0903        Lab Results  Component Value Date   WBC 4.3 07/04/2024   HGB 13.5 07/04/2024   HCT 44.7 07/04/2024   MCV 81 07/04/2024   PLT 181 07/04/2024     Lab Results  Component Value Date    DDIMER 0.44 07/04/2024      Lab Results  Component Value Date   TSH 3.510 07/04/2024    BNP   07/04/2024  = 21.6     Lab Results  Component Value Date   ESRSEDRATE 5 07/04/2024   ESRSEDRATE 5 05/12/2023   ESRSEDRATE 8 02/09/2023        Assessment:     Assessment & Plan NSIP (nonspecific interstitial pneumonia) (HCC) Onset of symptoms  spring 2017 R[facility] 05/18/16 with PA mean  17 and wedge 5 and CO  = 5.65  So PVR = 12/5.65 x 80 = 170 (wnl) Echo 10/31/17 :  Low nl with borderline global hypokinesis ef 50-55% and Mild LAE/ RVE / trace MR  - no change vs 04/28/16  - 11/04/2017  Walked RA x 3 laps @ 185 ft each stopped due to  End of study, fast pace, no sob or desat    -  PFT's  12/09/2017   FVC 3.82 (90%)  No obst    p nothing prior to study with DLCO ( 19.63) 60 % corrects to (4.04) 87  % for alv volume   - 12/09/2017  Walked RA x 3 laps @ 185 ft each stopped due to  End of study, fast pace, no sob or desat    - ONO RA 12/12/17 desats < 89% x 26 min  - in absence of PH /symptoms do not rec rx  - HRCT 01/20/2018 1. Appearance of the lungs is compatible with interstitial lung disease. CT pattern is considered indeterminate for usual interstitial pneumonia (UIP). 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 - 01/24/2018  Walked RA x 3 laps @ 185 ft each stopped due to  End of study,fast  pace, no sob or desat    - 6 mw 07/31/2018   378 mild sob no desats - 05/08/2019   Walked RA  2 laps @  approx 248ft each @ avg pace  stopped due to  End of study min sob, no cp, sats 94% - PFT's  05/08/2019  FVC  3.74 (89%) prior to study with DLCO  16.92 (67%) corrects to 3.37 (85%)  for alv volume and FV curve s convexity    - Labs 05/08/19  :   Neg collagen vasc profile with esr 7,  HSP serology neg  - HRCT 06/01/19 :  Findings are not significantly changed compared to prior examinations  dating back to 04/21/2016 remain non diagnostic  - 11/12/2019   Walked RA x two laps =  approx 548ft @ moderate pace - stopped due to end of study, no sob with sats of 96 % at the end of the study. - 10/16/2021   Walked on RA  x  3  lap(s) =  approx 750  ft  @ mod pace, stopped due to end of study  with lowest 02 sats 97%  - 02/09/2023   Walked on RA  x  2  lap(s) =  approx 500  ft  @ slow pace, stopped due to sob with lowest 02 sats 96%   - 05/12/2023   Walked on RA  x  3  lap(s) =  approx 450  ft  @ mod to brisk pace, stopped due to end of study  with lowest 02 sats 93%    01/02/2024   Walked on RA   x  3  lap(s) =  approx 450  ft  @  mod  pace, stopped due to end of study  with lowest 02 sats 94% s sob    -  07/04/2024   Walked on RA  x  3  lap(s) =  approx 450  ft  @ mod pace, stopped due to end of study  with lowest 02 sats 91% and c/o sob on last lap but did not need to stop so did not meet criteria for amb 02     Can try 6 days of prednisone  though with such low ESR's I don't expect it to help much and I'm more concerned about his geriactric decline and new report of Classic OD AF (see below)  F/u in 6 weeks no other recs for now  Dyspnea on exertion DOE labs 07/04/2024 all wnl with now evidence of chf  Amaurosis fugax of right eye Newly reported 07/04/2024  x sev months  duration c/w TIAs> deferred w/u to Dr Margaretann    Each maintenance medication was reviewed in detail including emphasizing most importantly the difference between maintenance and prns and under what circumstances the prns are to be triggered using an action plan format where appropriate.  Total time for H and P, chart review, counseling,  directly observing portions of ambulatory 02 saturation study/ and generating customized AVS unique to this office visit / same day charting = 35 min                        AVS  Patient Instructions  Please remember to go to the lab department   for your tests - we will call you with the  results when they are available.      Please remember to go to the  x-ray department  @  [facility] for your tests - we will call you with the results when they are available  Prednisone  10 mg take  4 each am x 2 days,   2 each am x 2 days,  1 each am x 2 days and stop   Call Dr Margaretann a black curtain coming down over your right eye  lasting up to a minute      Please schedule a follow up office visit in 6 weeks, call sooner if needed      Ozell America, MD 07/07/2024

## 2024-07-04 ENCOUNTER — Ambulatory Visit: Admitting: Internal Medicine

## 2024-07-04 ENCOUNTER — Encounter: Payer: Self-pay | Admitting: Internal Medicine

## 2024-07-04 ENCOUNTER — Ambulatory Visit (HOSPITAL_COMMUNITY)
Admission: RE | Admit: 2024-07-04 | Discharge: 2024-07-04 | Disposition: A | Discharge status: Home / Self Care | Source: Ambulatory Visit | Attending: Internal Medicine | Admitting: Internal Medicine

## 2024-07-04 VITALS — BP 135/70 | HR 77 | Temp 97.7°F | Ht 70.0 in | Wt 212.6 lb

## 2024-07-04 DIAGNOSIS — G453 Amaurosis fugax: Secondary | ICD-10-CM

## 2024-07-04 DIAGNOSIS — J8489 Other specified interstitial pulmonary diseases: Secondary | ICD-10-CM | POA: Diagnosis not present

## 2024-07-04 DIAGNOSIS — R0609 Other forms of dyspnea: Secondary | ICD-10-CM

## 2024-07-04 MED ORDER — PREDNISONE 10 MG PO TABS
ORAL_TABLET | ORAL | 0 refills | Status: DC
Start: 2024-07-04 — End: 2024-07-19

## 2024-07-04 NOTE — Patient Instructions (Addendum)
 Please remember to go to the lab department   for your tests - we will call you with the results when they are available.      Please remember to go to the  x-ray department  @  [facility] for your tests - we will call you with the results when they are available  Prednisone  10 mg take  4 each am x 2 days,   2 each am x 2 days,  1 each am x 2 days and stop   Call Dr Margaretann a black curtain coming down over your right eye  lasting up to a minute      Please schedule a follow up office visit in 6 weeks, call sooner if needed

## 2024-07-05 LAB — CBC WITH DIFFERENTIAL/PLATELET
Basophils Absolute: 0.1 x10E3/uL (ref 0.0–0.2)
Basos: 1 %
EOS (ABSOLUTE): 0.1 x10E3/uL (ref 0.0–0.4)
Eos: 3 %
Hematocrit: 44.7 % (ref 37.5–51.0)
Hemoglobin: 13.5 g/dL (ref 13.0–17.7)
Immature Grans (Abs): 0 x10E3/uL (ref 0.0–0.1)
Immature Granulocytes: 0 %
Lymphocytes Absolute: 1.2 x10E3/uL (ref 0.7–3.1)
Lymphs: 27 %
MCH: 24.4 pg — ABNORMAL LOW (ref 26.6–33.0)
MCHC: 30.2 g/dL — ABNORMAL LOW (ref 31.5–35.7)
MCV: 81 fL (ref 79–97)
Monocytes Absolute: 0.4 x10E3/uL (ref 0.1–0.9)
Monocytes: 9 %
Neutrophils Absolute: 2.5 x10E3/uL (ref 1.4–7.0)
Neutrophils: 60 %
Platelets: 181 x10E3/uL (ref 150–450)
RBC: 5.54 x10E6/uL (ref 4.14–5.80)
RDW: 15.3 % (ref 11.6–15.4)
WBC: 4.3 x10E3/uL (ref 3.4–10.8)

## 2024-07-05 LAB — SEDIMENTATION RATE: Sed Rate: 5 mm/h (ref 0–30)

## 2024-07-05 LAB — HEPATIC FUNCTION PANEL
ALT: 11 IU/L (ref 0–44)
AST: 22 IU/L (ref 0–40)
Albumin: 4.2 g/dL (ref 3.7–4.7)
Alkaline Phosphatase: 100 IU/L (ref 48–129)
Bilirubin Total: 0.8 mg/dL (ref 0.0–1.2)
Bilirubin, Direct: 0.28 mg/dL (ref 0.00–0.40)
Total Protein: 6.6 g/dL (ref 6.0–8.5)

## 2024-07-05 LAB — BASIC METABOLIC PANEL WITH GFR
BUN/Creatinine Ratio: 6 — ABNORMAL LOW (ref 10–24)
BUN: 9 mg/dL (ref 8–27)
CO2: 24 mmol/L (ref 20–29)
Calcium: 9.4 mg/dL (ref 8.6–10.2)
Chloride: 98 mmol/L (ref 96–106)
Creatinine, Ser: 1.47 mg/dL — ABNORMAL HIGH (ref 0.76–1.27)
Glucose: 83 mg/dL (ref 70–99)
Potassium: 4.8 mmol/L (ref 3.5–5.2)
Sodium: 136 mmol/L (ref 134–144)
eGFR: 47 mL/min/1.73 — ABNORMAL LOW (ref >59)

## 2024-07-05 LAB — TSH: TSH: 3.51 u[IU]/mL (ref 0.450–4.500)

## 2024-07-05 LAB — D-DIMER, QUANTITATIVE: D-DIMER: 0.44 mg{FEU}/L (ref 0.00–0.49)

## 2024-07-05 LAB — BRAIN NATRIURETIC PEPTIDE: BNP: 21.6 pg/mL (ref 0.0–100.0)

## 2024-07-07 ENCOUNTER — Encounter: Payer: Self-pay | Admitting: Internal Medicine

## 2024-07-07 ENCOUNTER — Ambulatory Visit: Payer: Self-pay | Admitting: Internal Medicine

## 2024-07-07 DIAGNOSIS — G453 Amaurosis fugax: Secondary | ICD-10-CM | POA: Insufficient documentation

## 2024-07-07 NOTE — Assessment & Plan Note (Addendum)
 Onset of symptoms  spring 2017 RHC 05/18/16 with PA mean  17 and wedge 5 and CO  = 5.65  So PVR = 12/5.65 x 80 = 170 (wnl) Echo 10/31/17 :  Low nl with borderline global hypokinesis ef 50-55% and Mild LAE/ RVE / trace MR  - no change vs 04/28/16  - 11/04/2017  Walked RA x 3 laps @ 185 ft each stopped due to  End of study, fast pace, no sob or desat    - PFT's  12/09/2017   FVC 3.82 (90%)  No obst    p nothing prior to study with DLCO ( 19.63) 60 % corrects to (4.04) 87  % for alv volume   - 12/09/2017  Walked RA x 3 laps @ 185 ft each stopped due to  End of study, fast pace, no sob or desat    - ONO RA 12/12/17 desats < 89% x 26 min  - in absence of PH /symptoms do not rec rx  - HRCT 01/20/2018 1. Appearance of the lungs is compatible with interstitial lung disease. CT pattern is considered indeterminate for usual interstitial pneumonia (UIP). 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 - 01/24/2018  Walked RA x 3 laps @ 185 ft each stopped due to  End of study,fast  pace, no sob or desat    - 6 mw 07/31/2018   378 mild sob no desats - 05/08/2019   Walked RA  2 laps @  approx 270ft each @ avg pace  stopped due to  End of study min sob, no cp, sats 94% - PFT's  05/08/2019  FVC  3.74 (89%) prior to study with DLCO  16.92 (67%) corrects to 3.37 (85%)  for alv volume and FV curve s convexity    - Labs 05/08/19  :   Neg collagen vasc profile with esr 7,  HSP serology neg  - HRCT 06/01/19 :  Findings are not significantly changed compared to prior examinations dating back to 04/21/2016 remain non diagnostic  - 11/12/2019   Walked RA x two laps =  approx 539ft @ moderate pace - stopped due to end of study, no sob with sats of 96 % at the end of the study. - 10/16/2021   Walked on RA  x  3  lap(s) =  approx 750  ft  @ mod pace, stopped due to end of study  with lowest 02 sats 97%  - 02/09/2023    Walked on RA  x  2  lap(s) =  approx 500  ft  @ slow pace, stopped due to sob with lowest 02 sats 96%   - 05/12/2023   Walked on RA  x  3  lap(s) =  approx 450  ft  @ mod to brisk pace, stopped due to end of study  with lowest 02 sats 93%    01/02/2024   Walked on RA   x  3  lap(s) =  approx 450  ft  @  mod  pace, stopped due to end of study  with lowest 02 sats 94% s sob    -  07/04/2024   Walked on RA  x  3  lap(s) =  approx 450  ft  @ mod pace, stopped due to end of study  with lowest 02 sats 91% and c/o sob on last lap but did not need  to stop so did not meet criteria for amb 02     Can try 6 days of prednisone  though with such low ESR's I don't expect it to help much and I'm more concerned about his geriactric decline and new report of Classic OD AF (see below)  F/u in 6 weeks no other recs for now

## 2024-07-07 NOTE — Assessment & Plan Note (Addendum)
 Newly reported 07/04/2024  x sev months duration c/w TIAs> deferred w/u to Dr Margaretann    Each maintenance medication was reviewed in detail including emphasizing most importantly the difference between maintenance and prns and under what circumstances the prns are to be triggered using an action plan format where appropriate.  Total time for H and P, chart review, counseling,  directly observing portions of ambulatory 02 saturation study/ and generating customized AVS unique to this office visit / same day charting = 35 min

## 2024-07-07 NOTE — Assessment & Plan Note (Addendum)
 DOE labs 07/04/2024 all wnl with now evidence of chf

## 2024-07-09 NOTE — Progress Notes (Signed)
 Atc - pt answered and then hung up - will try again

## 2024-07-10 ENCOUNTER — Ambulatory Visit: Payer: Self-pay | Admitting: Internal Medicine

## 2024-07-10 NOTE — Telephone Encounter (Signed)
 FYI Only or Action Required?: FYI only for provider.  Patient is followed in Pulmonology for chronic rhinitis, lung disease, last seen on 07/04/2024 by Darlean Ozell NOVAK, MD.  Called Nurse Triage reporting Advice Only.  Symptoms began information only call.  Interventions attempted: Other: information only call.  Symptoms are: information only call.  Triage Disposition: Information or Advice Only Call  Patient/caregiver understands and will follow disposition?: Yes                             Copied from CRM #[id number]. Topic: Clinical - Lab/Test Results >> Jul 10, 2024  8:41 AM Troy Sanchez wrote: Reason for CRM: pt returning call for lab results Reason for Disposition  Health information question, no triage required and triager able to answer question  Answer Assessment - Initial Assessment Questions 1. REASON FOR CALL: What is the main reason for your call? or How can I best help you?     Patient returning call from office regarding lab results. This RN read provider's note, Studies are ok x renal function slt worse, f/u with PCP as planned - be sure to avoid nsaids to patient. Patient has no additional questions at this time.  Protocols used: Information Only Call - No Triage-A-AH

## 2024-07-10 NOTE — Progress Notes (Signed)
 Atc x2 lmtcb sending letter

## 2024-07-10 NOTE — Telephone Encounter (Signed)
 Called and spoke with pt to go over labs and dr werts rec. Pt understood. nfn

## 2024-07-11 NOTE — Progress Notes (Signed)
 Called and spoke with pt regarding results, pt confirmed understanding

## 2024-07-12 ENCOUNTER — Other Ambulatory Visit: Payer: Self-pay | Admitting: Cardiology

## 2024-07-19 ENCOUNTER — Ambulatory Visit: Attending: Cardiology | Admitting: Cardiology

## 2024-07-19 ENCOUNTER — Encounter: Payer: Self-pay | Admitting: Cardiology

## 2024-07-19 VITALS — BP 126/82 | HR 65 | Ht 70.0 in | Wt 213.1 lb

## 2024-07-19 DIAGNOSIS — I452 Bifascicular block: Secondary | ICD-10-CM

## 2024-07-19 DIAGNOSIS — I2510 Atherosclerotic heart disease of native coronary artery without angina pectoris: Secondary | ICD-10-CM | POA: Diagnosis not present

## 2024-07-19 DIAGNOSIS — G453 Amaurosis fugax: Secondary | ICD-10-CM | POA: Diagnosis not present

## 2024-07-19 DIAGNOSIS — K219 Gastro-esophageal reflux disease without esophagitis: Secondary | ICD-10-CM | POA: Diagnosis not present

## 2024-07-19 DIAGNOSIS — R0602 Shortness of breath: Secondary | ICD-10-CM

## 2024-07-19 MED ORDER — CLOPIDOGREL BISULFATE 75 MG PO TABS
75.0000 mg | ORAL_TABLET | Freq: Every day | ORAL | 3 refills | Status: AC
Start: 2024-07-19 — End: ?

## 2024-07-19 MED ORDER — PANTOPRAZOLE SODIUM 40 MG PO TBEC: 40.0000 mg | DELAYED_RELEASE_TABLET | Freq: Every day | ORAL | 0 refills | Status: DC

## 2024-07-19 NOTE — Progress Notes (Signed)
 Cardiology Office Note:  .   Date:  07/19/2024  ID:  Elsie JONELLE Loll, DOB 03/26/1941, MRN [id number] PCP: Clarice Nottingham, MD  [facility] Providers Cardiologist:  None   History of Present Illness: .   SHAIN PAUWELS is a 83 y.o. male with hypertension, hyperlipidemia, moderate CAD (cardiac catheterization in 2017 mid LAD 50% stenosis otherwise no significant disease), mild pulmonary hypertension, hyperglycemia, chronic dyspnea and also pulmonary fibrosis, esophageal strictures status post dilatation in the past presents for follow-up of dyspnea.  Nuclear stress test on 03/15/2022 revealing mild diaphragmatic attenuation artifact and probable minimal ischemia, normal EF, low restudy.  Echocardiogram on 03/09/2023 revealing normal LV systolic function, mild aortic regurgitation.  His main complaint is marked dyspnea on exertion even with minimal activities, has noticed episodes where his oxygen saturation drops down to 84 to 85% with activity.  He feels miserable and states that he has no energy.  No chest pain.  He also complains of dizziness and fatigue. He also states that he has had occasional episodes of vision loss of right eye that last for several minutes and resolved spontaneously.  No headache, no neurologic weakness.  Cardiac Studies relevent.    Lexiscan  (with Mod Bruce protocol) Nuclear stress test 03/15/2022: Myocardial perfusion reveals diaphragmatic attenuation. Minimal ischemia in this region cannot be excluded. Overall LV systolic function is normal without regional wall motion abnormalities. Stress LV EF: 57%.  Echocardiogram 03/09/2023: 5Left ventricle cavity is normal in size and ventricular wall thickness. Normal global wall motion. Normal LV systolic function with EF 66%. Doppler evidence of grade I (impaired) diastolic dysfunction, normal LAP. Left atrial cavity is mildly dilated. Structurally normal trileaflet aortic valve. Mild (Grade I) aortic regurgitation.     Discussed the use of AI scribe software for clinical note transcription with the patient, who gave verbal consent to proceed.  History of Present Illness ABHISHEK LEVESQUE is an 83 year old male with interstitial pulmonary fibrosis who presents with worsening shortness of breath and dizziness.  He experiences significant shortness of breath with minimal exertion, with oxygen saturation dropping to 84% during activity. He has a history of interstitial pulmonary fibrosis.  He experiences lightheadedness and dizziness. A heart catheterization in 2017 showed mild plaque buildup with 50% stenosis in the LAD. He is on atorvastatin  10 mg daily for cholesterol management, with recent LDL levels at 48.  His right eye occasionally goes completely black for less than a minute, occurring approximately once every six weeks, a symptom that began about six months ago.  He was previously on isosorbide  mononitrate 60 mg for chest pain and aspirin . No current chest pain.   Labs    Recent Labs    07/04/24 0903  NA 136  K 4.8  CL 98  CO2 24  GLUCOSE 83  BUN 9  CREATININE 1.47*  CALCIUM  9.4    Lab Results  Component Value Date   ALT 11 07/04/2024   AST 22 07/04/2024   ALKPHOS 100 07/04/2024   BILITOT 0.8 07/04/2024      Latest Ref Rng & Units 07/04/2024    9:03 AM 05/12/2023    9:16 AM 02/09/2023    9:33 AM  CBC  WBC 3.4 - 10.8 x10E3/uL 4.3  4.4  4.5   Hemoglobin 13.0 - 17.7 g/dL 86.4  87.0  86.6   Hematocrit 37.5 - 51.0 % 44.7  39.6  40.6   Platelets 150 - 450 x10E3/uL 181  199  173.0  No results found for: HGBA1C  Lab Results  Component Value Date   TSH 3.510 07/04/2024    Care everywhere/Faxed External Labs:  01/04/2024:  Total cholesterol 98, HDL 35, LDL 48, triglycerides 72.  A1c 6.2%.  ROS  Review of Systems  Cardiovascular:  Positive for dyspnea on exertion. Negative for chest pain and leg swelling.  Respiratory:  Positive for wheezing.   Neurological:  Positive for  dizziness.   Physical Exam:   VS:  BP 126/82   Pulse 65   Ht 5' 10 (1.778 m)   Wt 213 lb 1.6 oz (96.7 kg)   SpO2 93%   BMI 30.58 kg/m    Wt Readings from Last 3 Encounters:  07/19/24 213 lb 1.6 oz (96.7 kg)  07/04/24 212 lb 9.6 oz (96.4 kg)  01/02/24 206 lb (93.4 kg)    BP Readings from Last 3 Encounters:  07/19/24 126/82  07/04/24 135/70  01/02/24 132/84   Physical Exam Neck:     Vascular: No carotid bruit or JVD.  Cardiovascular:     Rate and Rhythm: Normal rate and regular rhythm. Occasional Extrasystoles are present.    Pulses: Intact distal pulses.     Heart sounds: Normal heart sounds. No murmur heard.    No gallop.  Pulmonary:     Effort: Pulmonary effort is normal.     Breath sounds: Examination of the right-middle field reveals rhonchi. Examination of the left-middle field reveals rhonchi. Examination of the right-lower field reveals rhonchi. Examination of the left-lower field reveals rhonchi. Rhonchi present.  Abdominal:     General: Bowel sounds are normal.     Palpations: Abdomen is soft.  Musculoskeletal:     Right lower leg: No edema.     Left lower leg: No edema.    EKG:    EKG Interpretation Date/Time:  Thursday July 19 2024 11:02:10 EDT Ventricular Rate:  65 PR Interval:  176 QRS Duration:  148 QT Interval:  424 QTC Calculation: 440 R Axis:   -70  Text Interpretation: EKG 07/19/2024: Normal sinus rhythm at rate of 65 bpm, left anterior fascicular block.  Right bundle branch block.  Bifascicular block.  Borderline progression, cannot exclude anterolateral infarct old.  T wave inversion, inferior and lateral ischemia.  PACs (2).  No significant change from 02/02/2023. Confirmed by Echo Allsbrook, Jagadeesh [id number]) on 07/19/2024 11:28:05 AM  EKG 02/02/2023: Normal sinus rhythm with rate of 67 bpm, left axis deviation, left anterior fascicular block. Right bundle branch block. Poor R progression, cannot exclude anterolateral infarct old. Nonspecific T  abnormality.   ASSESSMENT AND PLAN: .      ICD-10-CM   1. Coronary artery disease involving native coronary artery of native heart without angina pectoris  I25.10 clopidogrel (PLAVIX) 75 MG tablet    2. Amaurosis fugax of right eye  G45.3 clopidogrel (PLAVIX) 75 MG tablet    VAS US  CAROTID    3. Shortness of breath  R06.02 EKG 12-Lead    ECHOCARDIOGRAM COMPLETE    4. 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 Gastroesophageal reflux disease without esophagitis  K21.9 pantoprazole  (PROTONIX ) 40 MG tablet     Assessment & Plan Atherosclerotic heart disease of native coronary artery without angina Well-controlled atherosclerotic heart disease with previous mild plaque buildup in the LAD and no current angina. LDL levels are well-managed with atorvastatin . - Continue atorvastatin  10 mg daily.  Bifascicular block Bifascicular block with no changes over the years. - Order echocardiogram to assess cardiac function.  Amaurosis fugax, right eye Intermittent vision loss in the right eye, possibly due to transient ischemic attacks from small blood clots. Risk of stroke considered. - Discontinue aspirin . - Initiate clopidogrel (Plavix) as an alternative antiplatelet therapy.  Discontinue aspirin . - Order carotid artery ultrasound to assess for blockages.  Idiopathic pulmonary fibrosis with hypoxemia and worsening shortness of breath Worsening shortness of breath likely due to progression of interstitial pulmonary fibrosis. Hypoxemia noted with exertion, with oxygen saturation dropping to 84%. - Send a note to pulmonologist Dr. Ozell America regarding worsening symptoms and need for further evaluation. - Consider a six-minute walk test to assess need for supplemental oxygen. - Evaluate for nocturnal hypoxemia and potential need for oxygen during sleep. - Order CT scan of the chest to assess lung status.  Dizziness and lightheadedness secondary to antihypertensive therapy Dizziness and  lightheadedness likely due to hypotensive effects of isosorbide  mononitrate. - Discontinue isosorbide  mononitrate.  Gastroesophageal reflux disease GERD managed with omeprazole , but potential interaction with clopidogrel necessitates change in therapy. - Discontinue omeprazole . - Initiate pantoprazole  once daily on an empty stomach. - Advise to avoid eating before bedtime and to refrain from evening snacks. - Allow use of famotidine  (Pepcid ) as needed, up to twice daily.   Follow up: 3 months  Signed,  Gordy Bergamo, MD, [facility] 07/19/2024, 8:49 PM [facility] [address] [place], [place] [address] Phone: [phone]. Fax:  [phone]

## 2024-07-19 NOTE — Patient Instructions (Signed)
 Medication Instructions:  Please STOP Imdur , aspirin  and omeprazole .  Please START plavix 75 mg daily.  Please START protonix  40 mg daily.  *If you need a refill on your cardiac medications before your next appointment, please call your pharmacy*  Lab Work: None.  If you have labs (blood work) drawn today and your tests are completely normal, you will receive your results only by: MyChart Message (if you have MyChart) OR A paper copy in the mail If you have any lab test that is abnormal or we need to change your treatment, we will call you to review the results.  Testing/Procedures: Your physician has requested that you have an echocardiogram. Echocardiography is a painless test that uses sound waves to create images of your heart. It provides your doctor with information about the size and shape of your heart and how well your heart's chambers and valves are working. This procedure takes approximately one hour. There are no restrictions for this procedure. Please do NOT wear cologne, perfume, aftershave, or lotions (deodorant is allowed). Please arrive 15 minutes prior to your appointment time.  Please note: We ask at that you not bring children with you during ultrasound (echo/ vascular) testing. Due to room size and safety concerns, children are not allowed in the ultrasound rooms during exams. Our front office staff cannot provide observation of children in our lobby area while testing is being conducted. An adult accompanying a patient to their appointment will only be allowed in the ultrasound room at the discretion of the ultrasound technician under special circumstances. We apologize for any inconvenience.  Your physician has requested that you have a carotid duplex. This test is an ultrasound of the carotid arteries in your neck. It looks at blood flow through these arteries that supply the brain with blood. Allow one hour for this exam. There are no restrictions or special  instructions.   Follow-Up: At [facility], you and your health needs are our priority.  As part of our continuing mission to provide you with exceptional heart care, our providers are all part of one team.  This team includes your primary Cardiologist (physician) and Advanced Practice Providers or APPs (Physician Assistants and Nurse Practitioners) who all work together to provide you with the care you need, when you need it.  Your next appointment:   3 month(s)  Provider:   Dr. DOROTHA Bergamo, MD

## 2024-08-15 ENCOUNTER — Ambulatory Visit: Admitting: Internal Medicine

## 2024-08-15 ENCOUNTER — Telehealth: Payer: Self-pay

## 2024-08-15 ENCOUNTER — Encounter: Payer: Self-pay | Admitting: Internal Medicine

## 2024-08-15 VITALS — BP 109/72 | HR 63 | Ht 70.0 in | Wt 213.0 lb

## 2024-08-15 DIAGNOSIS — G453 Amaurosis fugax: Secondary | ICD-10-CM | POA: Diagnosis not present

## 2024-08-15 DIAGNOSIS — R0609 Other forms of dyspnea: Secondary | ICD-10-CM | POA: Diagnosis not present

## 2024-08-15 DIAGNOSIS — J8489 Other specified interstitial pulmonary diseases: Secondary | ICD-10-CM | POA: Diagnosis not present

## 2024-08-15 NOTE — Assessment & Plan Note (Addendum)
 Onset of symptoms  spring 2017 RHC 05/18/16 with PA mean  17 and wedge 5 and CO  = 5.65  So PVR = 12/5.65 x 80 = 170 (wnl) Echo 10/31/17 :  Low nl with borderline global hypokinesis ef 50-55% and Mild LAE/ RVE / trace MR  - no change vs 04/28/16  - 11/04/2017  Walked RA x 3 laps @ 185 ft each stopped due to  End of study, fast pace, no sob or desat    - PFT's  12/09/2017   FVC 3.82 (90%)  No obst    p nothing prior to study with DLCO ( 19.63) 60 % corrects to (4.04) 87  % for alv volume   - 12/09/2017  Walked RA x 3 laps @ 185 ft each stopped due to  End of study, fast pace, no sob or desat    - ONO RA 12/12/17 desats < 89% x 26 min  - in absence of PH /symptoms do not rec rx  - HRCT 01/20/2018 1. Appearance of the lungs is compatible with interstitial lung disease. CT pattern is considered indeterminate for usual interstitial pneumonia (UIP). 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 - 01/24/2018  Walked RA x 3 laps @ 185 ft each stopped due to  End of study,fast  pace, no sob or desat    - 6 mw 07/31/2018   378 mild sob no desats - 05/08/2019   Walked RA  2 laps @  approx 220ft each @ avg pace  stopped due to  End of study min sob, no cp, sats 94% - PFT's  05/08/2019  FVC  3.74 (89%) prior to study with DLCO  16.92 (67%) corrects to 3.37 (85%)  for alv volume and FV curve s convexity    - Labs 05/08/19  :   Neg collagen vasc profile with esr 7,  HSP serology neg  - HRCT 06/01/19 :  Findings are not significantly changed compared to prior examinations dating back to 04/21/2016 remain non diagnostic  - 11/12/2019   Walked RA x two laps =  approx 518ft @ moderate pace - stopped due to end of study, no sob with sats of 96 % at the end of the study. - 10/16/2021   Walked on RA  x  3  lap(s) =  approx 750  ft  @ mod pace, stopped due to end of study  with lowest 02 sats 97%  - 02/09/2023    Walked on RA  x  2  lap(s) =  approx 500  ft  @ slow pace, stopped due to sob with lowest 02 sats 96%   - 05/12/2023   Walked on RA  x  3  lap(s) =  approx 450  ft  @ mod to brisk pace, stopped due to end of study  with lowest 02 sats 93%    01/02/2024   Walked on RA   x  3  lap(s) =  approx 450  ft  @  mod  pace, stopped due to end of study  with lowest 02 sats 94% s sob   -  07/04/2024   Walked on RA  x  3  lap(s) =  approx 450  ft  @ mod pace, stopped due to end of study  with lowest 02 sats 91% and c/o sob on last lap but did not need to  stop   - 08/15/2024   Walked on RA  x  3  lap(s) =  approx 450  ft  @ fast pace, stopped due to end of study  with lowest 02 sats 89%    Pt reluctant as in the past to go to PF clinic but he is clearly worse and 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     >>> referred to PF clinic and here prn

## 2024-08-15 NOTE — Telephone Encounter (Signed)
 Patient scheduled.

## 2024-08-15 NOTE — Telephone Encounter (Signed)
 Attempted to call patient. Left voicemail for patient to call back to get scheduled with [facility] or Dr.Mannam in [place].

## 2024-08-15 NOTE — Assessment & Plan Note (Addendum)
 Newly reported 07/04/2024  x sev months duration > deferred w/u to Dr Margaretann   >>> w/u in progress, has not recurred since last ov    Each maintenance medication was reviewed in detail including emphasizing most importantly the difference between maintenance and prns and under what circumstances the prns are to be triggered using an action plan format where appropriate.  Total time for H and P, chart review, counseling,  directly observing portions of ambulatory 02 saturation study/ and generating customized AVS unique to this office visit / same day charting = 35 min final summary f/u ov

## 2024-08-15 NOTE — Patient Instructions (Signed)
 Make sure you check your oxygen saturation at your highest level of activity(NOT after you stop)  to be sure it stays over 90% and keep track of it at least once a week, more often if breathing getting worse, and let me know if losing ground. (Collect the dots to connect the dots approach)     Pace yourself so you don't drop below 90%   I am referring you to the pulmonary fibrosis clinic in [place]  Follow up in this office is as needed once you establish there.

## 2024-08-15 NOTE — Telephone Encounter (Signed)
 Please call pt and scheduled appt with Dr Geronimo or [facility] for pulmo fibrosis clinic. Please and thanks

## 2024-08-15 NOTE — Progress Notes (Signed)
 Subjective:    Patient ID: Troy Sanchez, male   DOB: 10/25/1940     MRN: [id number]    Brief patient profile: 87 yowm never smoker with onset of doe around late spring  2017  > referred to Dr Margaretann who dx [facility] by R[facility] 05/2016 and concerned about crackles on exam 10/31/17 so referred to pulmonary clinic 11/04/2017 by Dr   Margaretann with ultimate dx of NSIP    History of Present Illness  11/04/2017 1st  Pulmonary office visit/ [facility] [place]   Chief Complaint  Patient presents with   Pulmonary Consult    Referred by Dr. Ladona for eval of pulmonary hypertension.  He states he has had DOE for the past 2 yrs. He states he only gets winded when he walks up an incline.    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, manufacturing systems  Cough: hoarse / lots of throat clearing some better on clariton / min mucoid production  Sleeping: 10 degrees HOB no cc SABA use: none  02: none  Rec Instead of clariton ok to try zyrtec over the counter an hour before bedtime to see if helps  Make sure you check your oxygen saturation at your highest level of activity(NOT after you stop)  to be sure it stays over 90%  Labs:  Allergy screen 05/12/2023 >  Eos 0.1 /  IgE  37 Lab Results  Component Value Date   HGB 12.9 (L) 05/12/2023   HGB 13.3 02/09/2023   HGB 14.2 06/09/2021   HGB 11.5 (L) 08/21/2020     Lab Results  Component Value Date   ESRSEDRATE 5 05/12/2023   ESRSEDRATE 8  02/09/2023   ESRSEDRATE 7 05/08/2019     01/02/2024  f/u ov/[place] office/Sheryl Towell 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 Better cough since changed to zyrtec  Sleeping: 10-15  degrees hob   resp cc  SABA use: none  02: none  Rec Make sure you check your oxygen saturation at your highest level of activity(NOT after you stop)  to be sure it stays over 90% and keep track of it at least once a week, more often if breathing getting worse, and let me know if losing ground. 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  maint on gerd rx   Chief Complaint  Patient presents with   Shortness of Breath    SOB with exertion  Dyspnea:  mb and back x 50 ft,  has to stop now sometimes  half way back slt uphill to rest with  lowest 84% reported (not reproduced at time of ov)  Cough: min mucoid assoc with worse nasal congestion / hearing loss  Sleeping: 10-15 degrees  s resp cc  SABA use: none  02: none  Amaurosis fugax OD  x sev months lasts up to a minute  Patient Instructions  Prednisone  10 mg take  4 each am x 2 days,   2 each am x 2 days,  1 each am x 2 days and stop  Call Dr Margaretann a black curtain coming down over your right eye  lasting up to a minute        08/15/2024  f/u ov/[place] office/Datra Clary re:  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    Also denies any  obvious fluctuation of symptoms with weather or environmental changes or other aggravating or alleviating factors except as outlined above   No unusual exposure hx or h/o childhood pna/ asthma or knowledge of premature birth.  Current Allergies, Complete Past Medical History, Past Surgical History, Family History, and Social History were reviewed in [employer] record.  ROS  The following are not active complaints unless bolded Hoarseness, sore throat, dysphagia, dental problems, itching, sneezing,  nasal congestion or discharge of excess mucus or purulent secretions, ear ache,   fever, chills, sweats, unintended wt loss or wt gain, classically pleuritic or exertional cp,  orthopnea pnd or arm/hand swelling  or leg swelling, presyncope, palpitations, abdominal pain, anorexia, nausea, vomiting, diarrhea  or change in bowel habits or change in bladder habits, change in stools or change in urine, dysuria, hematuria,  rash, arthralgias, visual complaints, headache, numbness, weakness or ataxia or problems with walking or coordination,  change in mood or  memory.        Current Meds  Medication Sig   atorvastatin  (LIPITOR) 10 MG tablet Take 10 mg by mouth daily.   carboxymethylcellulose (REFRESH PLUS) 0.5 % SOLN 1 drop 3 (three) times daily as needed.   cetirizine (ZYRTEC) 10 MG tablet Take 10 mg by mouth daily.   cholecalciferol  (VITAMIN D ) 1000 UNITS tablet Take 1,000 Units by mouth every morning.    clopidogrel (PLAVIX) 75 MG tablet Take 1 tablet (75 mg total) by mouth daily.   famotidine  (PEPCID ) 20 MG tablet ONE AFTER SUPPER   magnesium  oxide (MAG-OX) 400 MG tablet Take 400 mg by mouth daily. (Patient taking differently: Take 400 mg by mouth as needed.)   pantoprazole  (PROTONIX ) 40 MG tablet Take 1 tablet (40 mg total) by mouth daily before breakfast.              Objective:   Physical Exam  Wts  08/15/2024       213   07/04/2024       212  01/02/2024       206   05/12/2023         209 02/09/2023         208  10/16/2021         199  09/18/2020         177 11/12/2019          217  05/08/2019        217  07/31/2018      219  01/24/2018        212   12/09/2017        209   11/04/17 206 lb (93.4 kg)  05/18/16 215 lb (97.5 kg)  08/15/14 203 lb 14.8 oz (92.5 kg)    Vital signs reviewed  08/15/2024  - Note at rest 02 sats  98% on RA   General appearance:    robust amb wm nad        HEENT : Oropharynx  clear/ edentulous       NECK :  without  apparent JVD/ palpable Nodes/TM    LUNGS: no acc muscle use,  Nl contour chest with mild insp crackles R > L base and no cough on deep insp maneuvers    CV:  RRR  no s3 or murmur or increase in P2, and no edema   ABD:  soft and nontender   MS:  Gait nl   ext warm without deformities Or obvious joint restrictions  calf tenderness, cyanosis or clubbing    SKIN: warm and dry without lesions    NEURO:  alert, approp, nl sensorium with  no motor or cerebellar deficits apparent.      Assessment:     Assessment & Plan NSIP (nonspecific interstitial pneumonia) (HCC) Onset of symptoms  spring 2017 RHC 05/18/16 with PA mean  17 and wedge 5 and CO  = 5.65  So PVR = 12/5.65 x 80 = 170 (wnl) Echo 10/31/17 :  Low nl with borderline global hypokinesis ef 50-55% and Mild LAE/ RVE / trace MR  - no change vs 04/28/16  - 11/04/2017  Walked RA x 3 laps @ 185 ft each stopped due to  End of study, fast pace, no sob or desat    - PFT's  12/09/2017   FVC 3.82 (90%)  No obst    p nothing prior to study with DLCO ( 19.63) 60 % corrects to (4.04) 87  % for alv volume   - 12/09/2017  Walked RA x 3 laps @ 185 ft each stopped due to  End of study, fast pace, no sob or desat    - ONO RA 12/12/17 desats < 89% x 26 min  - in absence of PH /symptoms do not rec rx  - HRCT 01/20/2018 1. Appearance of the lungs is compatible with interstitial lung disease. CT pattern is considered indeterminate for usual interstitial pneumonia (UIP). 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 - 01/24/2018  Walked RA x 3 laps @ 185 ft each stopped due to  End of study,fast  pace, no sob or desat    - 6 mw 07/31/2018   378 mild sob no desats - 05/08/2019   Walked RA  2 laps @  approx 227ft each @ avg pace  stopped due to  End of study min sob, no cp, sats 94% - PFT's  05/08/2019  FVC  3.74 (89%) prior to study with DLCO  16.92 (67%) corrects to 3.37 (85%)  for alv volume and FV curve s convexity    - Labs 05/08/19  :  Neg collagen vasc profile with esr 7,  HSP serology neg  - HRCT 06/01/19 :  Findings are not significantly changed compared to prior examinations dating back to 04/21/2016 remain non diagnostic  - 11/12/2019   Walked RA x two laps =  approx 528ft @ moderate pace - stopped due to end of study, no sob with sats of 96 % at the end of the study. - 10/16/2021   Walked on RA  x  3  lap(s) =  approx 750  ft  @ mod pace, stopped due to end of study  with lowest 02 sats 97%  - 02/09/2023   Walked on RA  x  2  lap(s) =  approx 500  ft  @ slow pace, stopped due to sob with lowest 02 sats 96%   - 05/12/2023   Walked on RA  x  3  lap(s) =  approx 450  ft  @ mod to brisk pace, stopped due to end of study  with lowest 02 sats 93%    01/02/2024   Walked on RA   x  3  lap(s) =  approx 450  ft  @  mod  pace, stopped due to end of study  with lowest 02 sats 94% s sob   -  07/04/2024   Walked on RA  x  3  lap(s) =  approx 450  ft  @ mod pace, stopped due to end of study  with lowest 02 sats 91% and c/o sob on last lap but did not need to stop   - 08/15/2024   Walked on RA  x  3  lap(s) =  approx 450  ft  @ fast pace, stopped due to end of study  with lowest 02 sats 89%    Pt reluctant as in the past to go to PF clinic but he is clearly worse and 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     >>> referred to PF clinic and here prn    Amaurosis fugax of right eye Newly reported 07/04/2024  x sev months duration > deferred w/u to Dr Margaretann   >>> w/u in progress, has not recurred since last ov    Each maintenance medication was reviewed in detail including emphasizing most importantly the difference between maintenance and prns and under what circumstances the prns are to be triggered using an action plan format where appropriate.  Total time for H and P, chart review, counseling,  directly observing portions of ambulatory 02 saturation study/ and generating customized AVS unique to this office visit / same day charting = 35 min final summary f/u ov                   AVS  Patient Instructions  Make sure you check your oxygen saturation at your highest level of activity(NOT after you stop)  to be sure it stays over 90% and keep track of it at least once a week, more often if breathing getting worse, and let me know if losing ground. (Collect the dots to connect the dots approach)     Pace yourself so you  don't drop below 90%   I am referring you to the pulmonary fibrosis clinic in [place]  Follow up in this office is as needed once you establish there.    Ozell America, MD 08/15/2024

## 2024-08-16 ENCOUNTER — Ambulatory Visit: Admitting: Internal Medicine

## 2024-08-16 ENCOUNTER — Encounter: Payer: Self-pay | Admitting: Internal Medicine

## 2024-08-16 VITALS — BP 112/60 | HR 60 | Temp 98.1°F | Ht 70.0 in | Wt 211.8 lb

## 2024-08-16 DIAGNOSIS — Z8679 Personal history of other diseases of the circulatory system: Secondary | ICD-10-CM | POA: Diagnosis not present

## 2024-08-16 DIAGNOSIS — J849 Interstitial pulmonary disease, unspecified: Secondary | ICD-10-CM

## 2024-08-16 DIAGNOSIS — R0609 Other forms of dyspnea: Secondary | ICD-10-CM

## 2024-08-16 DIAGNOSIS — Z789 Other specified health status: Secondary | ICD-10-CM

## 2024-08-16 LAB — CBC WITH DIFFERENTIAL/PLATELET
Basophils Absolute: 0.1 K/uL (ref 0.0–0.1)
Basophils Relative: 1.4 % (ref 0.0–3.0)
Eosinophils Absolute: 0.2 K/uL (ref 0.0–0.7)
Eosinophils Relative: 4.8 % (ref 0.0–5.0)
HCT: 39.7 % (ref 39.0–52.0)
Hemoglobin: 13 g/dL (ref 13.0–17.0)
Lymphocytes Relative: 24.1 % (ref 12.0–46.0)
Lymphs Abs: 1 K/uL (ref 0.7–4.0)
MCHC: 32.8 g/dL (ref 30.0–36.0)
MCV: 75.6 fl — ABNORMAL LOW (ref 78.0–100.0)
Monocytes Absolute: 0.4 K/uL (ref 0.1–1.0)
Monocytes Relative: 9.4 % (ref 3.0–12.0)
Neutro Abs: 2.4 K/uL (ref 1.4–7.7)
Neutrophils Relative %: 60.3 % (ref 43.0–77.0)
Platelets: 162 K/uL (ref 150.0–400.0)
RBC: 5.25 Mil/uL (ref 4.22–5.81)
RDW: 16.8 % — ABNORMAL HIGH (ref 11.5–15.5)
WBC: 4 K/uL (ref 4.0–10.5)

## 2024-08-16 LAB — SEDIMENTATION RATE: Sed Rate: 6 mm/h (ref 0–20)

## 2024-08-16 NOTE — Patient Instructions (Addendum)
 ICD-10-CM   1. Dyspnea on exertion  R06.09     2. History of pulmonary hypertension  Z86.79     3. ILD (interstitial lung disease) (HCC)  J84.9     4. 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 Other alternative is if you have a new heart issue Other cause of shortness of breath worsening could be weight related issues and physical deconditioning or anemia   Plan - Rigt now for 600 feet simple walk no need o2 for  - Take interstitial lung disease questionnaire with you and bring it back next visit - Do blood work for CBC, ANA, rheumatoid factor, CCP and ESR [last checked 2025 and normal] - Do spirometry and DLCO pulmonary function test - Await results of echocardiogram - Get high-resolution CT chest [last 1 was 5 years ago] - Test overnight pulse oximetry on room air  Follow-up - Return to see me or nurse practitioner in the next 4 weeks but after completing the above  - If there is evidence of fibrosis getting worse he will need antifibrotic  -0 okay to see the nurse practitioner ; are important you are seen within 4 weeks or 6 weeks

## 2024-08-16 NOTE — Progress Notes (Signed)
 OV 08/16/2024  Subjective:  Patient ID: Troy Sanchez, male , DOB: [birth year] , age 83 y.o. , MRN: [id number] , ADDRESS: [address] [place] [address] PCP Clarice Nottingham, MD Patient Care Team: Clarice Nottingham, MD as PCP - General (Internal Medicine) Darlean Ozell NOVAK, MD as Consulting Physician (Pulmonary Disease)  This Provider for this visit: Treatment Team:  Attending Provider: Geronimo Amel, MD    08/16/2024 -   Chief Complaint  Patient presents with   Shortness of Breath    Pt states since LOV breathing has been okay as long as pt not doing anything activity that makes him strain his self SOB w/ exertion Dry cough, Prod cough rarely     HPI Troy Sanchez 83 y.o. -ATHARV BARRIERE is an 83 year old male with pulmonary hypertension and pulmonary fibrosis who presents with worsening shortness of breath. He was referred by Dr. Ozell Darlean for evaluation of his pulmonary conditions. HEre with son who si indy historian  He has a history of pulmonary hypertension diagnosed several years ago (2017 by external medical record review.) but says he has been stable. ,  He has a history of pulmonary fibrosis or interstitial lung disease, which was noted to be mild and stable on a chest scan five years ago. He reports that he is not on any treatment for this condition. He reports a recent oxygen saturation drop during a walk test, from 89 to 90%. This shortness of breath has progressively worsened over the past six months to a year. He experiences significant dyspnea on exertion, such as walking in the yard or climbing stairs at a normal pace. If he takes stairs slowly, he can manage without stopping, but at a normal pace, he becomes winded. This shortness of breath has been impacting his quality of life, 2020 immunology profile was normal.  Last high-resolution CT chest was in 2020 and showed mild ILD which is stable from 2017.  I personally visualized this and also showed him  and his son this.  He also feels 'terrible all the time'.  He did This is intermittent headache and head stuffiness.  He says previous head CTs have been normal. He also experiences dizziness and lightheadedness frequently over the past six months to a year. He describes a sensation of his head feeling 'heavy' and mentions a period of frequent headaches, which have subsided in the last couple of months. No sinus congestion and he does not believe he snores at night. He takes vitamin B12, 100 mg daily, due to a previous deficiency. He denies the use of oxygen therapy, CPAP, or having sleep apnea. He reports no issues with sleep but feels tired all the time, expressing a desire to sleep frequently.  He mentions a past episode where his right eye went completely black for a few seconds to a minute, which has not occurred in the past couple of months. He has undergone scans of his head and neck, as well as chest x-rays, in the past.   Simple office walk 224 (66+46 x 2) feet Pod A at [facility] x  3 laps goal with forehead probe 08/16/2024    O2 used ra   Number laps completed 3   Comments about pace fast   Resting Pulse Ox/HR 97% and 57/min   Final Pulse Ox/HR 90% and 80/min   Desaturated </= 88% no   Desaturated <= 3% points yes   Got Tachycardic >/= 90/min no  Symptoms at end of test Mild dyspnea at end   Miscellaneous comments Did not go below 88%      CHT CHEST HRCT 8/211/2020  Narrative & Impression  CLINICAL DATA:  IPF, pulmonary hypertension, chronic shortness of breath   EXAM: CT CHEST WITHOUT CONTRAST   TECHNIQUE: Multidetector CT imaging of the chest was performed following the standard protocol without intravenous contrast. High resolution imaging of the lungs, as well as inspiratory and expiratory imaging, was performed.   COMPARISON:  01/20/2018, 04/21/2016   FINDINGS: Cardiovascular: No significant vascular findings. Normal heart size. Three-vessel coronary artery  calcifications and/or stents. No pericardial effusion.   Mediastinum/Nodes: No enlarged mediastinal, hilar, or axillary lymph nodes. Thyroid  gland, trachea, and esophagus demonstrate no significant findings.   Lungs/Pleura: There is a redemonstrated pattern of mild pulmonary fibrosis with an apical to basal gradient featuring irregular peripheral interstitial opacity and minimal associated ground-glass. Mild, predominantly tubular bronchiectasis. No significant air trapping on expiratory phase imaging. No pleural effusion or pneumothorax.   Upper Abdomen: No acute abnormality. Unusual moderate hiatal hernia with evidence of prior repair, which contains a portion of the pancreatic body.   Musculoskeletal: No chest wall mass or suspicious bone lesions identified.   IMPRESSION: 1. There is a redemonstrated pattern of mild pulmonary fibrosis with an apical to basal gradient featuring irregular peripheral interstitial opacity and minimal associated ground-glass. Mild, predominantly tubular bronchiectasis. No significant air trapping on expiratory phase imaging. Findings are not significantly changed compared to prior examinations dating back to 04/21/2016 and remain consistent with an indeterminate for UIP pattern by ATS pulmonary fibrosis criteria. Per ordering indication, patient has an established diagnosis of IPF.   2.  Coronary artery disease.     Electronically Signed   By: Marolyn Jaksch M.D.   On: 06/01/2019 15:50     ECHO APRIL 2024  Echocardiogram 03/09/2023: 5Left ventricle cavity is normal in size and ventricular wall thickness. Normal global wall motion. Normal LV systolic function with EF 66%. Doppler evidence of grade I (impaired) diastolic dysfunction, normal LAP. Left atrial cavity is mildly dilated. Structurally normal trileaflet aortic valve. Mild (Grade I) aortic regurgitation. No evidence of pulmonary hypertension.   PFT     Latest Ref Rng & Units  05/08/2019    8:46 AM 12/09/2017    8:39 AM  PFT Results  FVC-Pre L 3.74  3.82   FVC-Predicted Pre % 89  90   FVC-Post L 3.69  3.89   FVC-Predicted Post % 88  92   Pre FEV1/FVC % % 80  80   Post FEV1/FCV % % 82  81   FEV1-Pre L 2.98  3.04   FEV1-Predicted Pre % 98  99   FEV1-Post L 3.02  3.16   DLCO uncorrected ml/min/mmHg 16.92  19.63   DLCO UNC% % 67  60   DLVA Predicted % 85  87   TLC L 5.71  5.65   TLC % Predicted % 81  80   RV % Predicted % 76  72    Immunization History  Administered Date(s) Administered   Pneumococcal Conjugate-13 03/26/2015   Tdap 12/11/2018   No Known Allergies    2017 LEFT and RIGHT HEART CATH  1. Normal LV systolic function, EF 50-55% without wall motion abnormality. 2. Distal circumflex 50% stenosis, mid LAD diffuse long segment 50% stenosis and mid RCA 30% stenosis, focal. 3. Mild pulmonary hypertension. Mean PA pressure 25 mmHg. PW was 10 mmHg. Reduced cardiac output and cardiac  index at 4.26/1.74.     Waljk tests in pulmonary  HRCT 06/01/19 :  Findings are not significantly changed compared to prior examinations dating back to 04/21/2016 remain non diagnostic  - 11/12/2019   Walked RA x two laps =  approx 582ft @ moderate pace - stopped due to end of study, no sob with sats of 96 % at the end of the study. - 10/16/2021   Walked on RA  x  3  lap(s) =  approx 750  ft  @ mod pace, stopped due to end of study  with lowest 02 sats 97%  - 02/09/2023   Walked on RA  x  2  lap(s) =  approx 500  ft  @ slow pace, stopped due to sob with lowest 02 sats 96%   - 05/12/2023   Walked on RA  x  3  lap(s) =  approx 450  ft  @ mod to brisk pace, stopped due to end of study  with lowest 02 sats 93%    01/02/2024   Walked on RA   x  3  lap(s) =  approx 450  ft  @  mod  pace, stopped due to end of study  with lowest 02 sats 94% s sob   -  07/04/2024   Walked on RA  x  3  lap(s) =  approx 450  ft  @ mod pace, stopped due to end of study  with lowest 02 sats 91% and c/o sob  on last lap but did not need to stop   - 08/15/2024   Walked on RA  x  3  lap(s) =  approx 450  ft  @ fast pace, stopped due to end of study  with lowest 02 sats 89%    has a past medical history of CAD (coronary artery disease) (07/02/2019), Esophageal motility disorder, Esophageal stenosis, GERD (gastroesophageal reflux disease) (01/07/2021), Hiatal hernia, HTN (hypertension) (07/02/2019), Hypertension, Recurrent Clostridium difficile diarrhea, and Zenker diverticulum.   reports that he has never smoked. He has never used smokeless tobacco.  Past Surgical History:  Procedure Laterality Date   BELPHAROPTOSIS REPAIR Bilateral    CARDIAC CATHETERIZATION N/A 05/18/2016   Procedure: Right/Left Heart Cath and Coronary Angiography;  Surgeon: Gordy Bergamo, MD;  Location: [facility] INVASIVE CV LAB;  Service: Cardiovascular;  Laterality: N/A;   CATARACT EXTRACTION Bilateral    CHOLECYSTECTOMY  10/11/2004   GASTROSTOMY N/A 03/06/2014   Procedure: GASTROSTOMY;  Surgeon: Lynwood MALVA Pina, MD;  Location: [facility] OR;  Service: General;  Laterality: N/A;   HIATAL HERNIA REPAIR N/A 03/06/2014   Procedure: HERNIA REPAIR HIATAL;  Surgeon: Lynwood MALVA Pina, MD;  Location: [facility] OR;  Service: General;  Laterality: N/A;   LAPAROTOMY N/A 03/06/2014   Procedure: EXPLORATORY LAPAROTOMY;  Surgeon: Lynwood MALVA Pina, MD;  Location: [facility] OR;  Service: General;  Laterality: N/A;   TRANSURETHRAL RESECTION OF PROSTATE N/A 08/15/2014   Procedure: TRANSURETHRAL RESECTION OF THE PROSTATE WITH GYRUS INSTRUMENTS;  Surgeon: Norleen JINNY Seltzer, MD;  Location: [facility] ORS;  Service: Urology;  Laterality: N/A;   UMBILICAL HERNIA REPAIR      No Known Allergies  Immunization History  Administered Date(s) Administered   Pneumococcal Conjugate-13 03/26/2015   Tdap 12/11/2018    Family History  Problem Relation Age of Onset   Colon cancer Neg Hx    Esophageal cancer Neg Hx    Stomach cancer Neg Hx    Pancreatic cancer Neg Hx    Liver  disease Neg Hx       Current Outpatient Medications:    atorvastatin  (LIPITOR) 10 MG tablet, Take 10 mg by mouth daily., Disp: , Rfl:    carboxymethylcellulose (REFRESH PLUS) 0.5 % SOLN, 1 drop 3 (three) times daily as needed., Disp: , Rfl:    cetirizine (ZYRTEC) 10 MG tablet, Take 10 mg by mouth daily., Disp: , Rfl:    cholecalciferol  (VITAMIN D ) 1000 UNITS tablet, Take 1,000 Units by mouth every morning. , Disp: , Rfl:    clopidogrel (PLAVIX) 75 MG tablet, Take 1 tablet (75 mg total) by mouth daily., Disp: 90 tablet, Rfl: 3   cyanocobalamin  (VITAMIN B12) 1000 MCG tablet, Take 1,000 mcg by mouth daily., Disp: , Rfl:    escitalopram (LEXAPRO) 5 MG tablet, Take 5 mg by mouth daily., Disp: , Rfl:    famotidine  (PEPCID ) 20 MG tablet, ONE AFTER SUPPER, Disp: 90 tablet, Rfl: 3   magnesium  oxide (MAG-OX) 400 MG tablet, Take 400 mg by mouth daily. (Patient taking differently: Take 400 mg by mouth as needed.), Disp: , Rfl:    pantoprazole  (PROTONIX ) 40 MG tablet, Take 1 tablet (40 mg total) by mouth daily before breakfast., Disp: 90 tablet, Rfl: 0      Objective:   Vitals:   08/16/24 1002  BP: 112/60  Pulse: 60  Temp: 98.1 F (36.7 C)  TempSrc: Oral  SpO2: 97%  Weight: 211 lb 12.8 oz (96.1 kg)  Height: 5' 10 (1.778 m)    Estimated body mass index is 30.39 kg/m as calculated from the following:   Height as of this encounter: 5' 10 (1.778 m).   Weight as of this encounter: 211 lb 12.8 oz (96.1 kg).  @WEIGHTCHANGE @  [employer]   08/16/24 1002  Weight: 211 lb 12.8 oz (96.1 kg)     Physical Exam   General: No distress. Looks well O2 at rest: no Cane present: no Sitting in wheel chair: no Frail: no Obese: YES Neuro: Alert and Oriented x 3. GCS 15. Speech normal Psych: Pleasant Resp:  Barrel Chest - no.  Wheeze - no, Crackles - YES BSAE, No overt respiratory distress CVS: Normal heart sounds. Murmurs - no Ext: Stigmata of Connective Tissue Disease - no HOGH + HEENT: Normal upper  airway. PEERL +. No post nasal drip   ASSESSMENT Patient Instructions     ICD-10-CM   1. Dyspnea on exertion  R06.09     2. History of pulmonary hypertension  Z86.79     3. ILD (interstitial lung disease) (HCC)  J84.9     4. 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 Other alternative is if you have a new heart issue Other cause of shortness of breath worsening could be weight related issues and physical deconditioning or anemia   Plan - Rigt now for 600 feet simple walk no need o2 for  - Take interstitial lung disease questionnaire with you and bring it back next visit - Do blood work for CBC, ANA, rheumatoid factor, CCP and ESR [last checked 2025 and normal] - Do spirometry and DLCO pulmonary function test - Await results of echocardiogram - Get high-resolution CT chest [last 1 was 5 years ago] - Test overnight pulse oximetry on room air  Follow-up - Return to see me or nurse practitioner in the next 4 weeks but after completing the above  - If  there is evidence of fibrosis getting worse he will need antifibrotic  -0 okay to see the nurse practitioner ; are important you are seen within 4 weeks or 6 weeks     SIGNATURE    Dr. Dorethia Cave, M.D., F.C.C.P,  Pulmonary and Critical Care Medicine Staff Physician, [facility] Director - Interstitial Lung Disease  Program  Pulmonary Fibrosis [facility] at [facility] [place], [place], [address]   Pager: [phone], If no answer  -> Check AMION or Try [phone] Telephone (clinical office): [phone] Telephone (research): [phone]  10:37 AM 08/16/2024

## 2024-08-19 LAB — RHEUMATOID FACTOR: Rheumatoid fact SerPl-aCnc: 38 [IU]/mL — ABNORMAL HIGH (ref <14)

## 2024-08-19 LAB — QUANTIFERON-TB GOLD PLUS
Mitogen-NIL: 7.53 [IU]/mL
NIL: 0.01 [IU]/mL
QuantiFERON-TB Gold Plus: NEGATIVE
TB1-NIL: 0 [IU]/mL
TB2-NIL: 0 [IU]/mL

## 2024-08-19 LAB — CYCLIC CITRUL PEPTIDE ANTIBODY, IGG: Cyclic Citrullin Peptide Ab: <16 U

## 2024-08-19 LAB — ANA: Anti Nuclear Antibody (ANA): POSITIVE — AB

## 2024-08-19 LAB — ANTI-NUCLEAR AB-TITER (ANA TITER): ANA Titer 1: 1:40 {titer} — ABNORMAL HIGH

## 2024-08-20 ENCOUNTER — Ambulatory Visit: Payer: Self-pay | Admitting: Cardiology

## 2024-08-20 ENCOUNTER — Ambulatory Visit (HOSPITAL_COMMUNITY)
Admission: RE | Admit: 2024-08-20 | Discharge: 2024-08-20 | Disposition: A | Source: Ambulatory Visit | Attending: Cardiology | Admitting: Cardiology

## 2024-08-20 ENCOUNTER — Ambulatory Visit (HOSPITAL_COMMUNITY)
Admission: RE | Admit: 2024-08-20 | Discharge: 2024-08-20 | Disposition: A | Discharge status: Home / Self Care | Source: Ambulatory Visit | Attending: Cardiology | Admitting: Cardiology

## 2024-08-20 DIAGNOSIS — R0602 Shortness of breath: Secondary | ICD-10-CM | POA: Insufficient documentation

## 2024-08-20 DIAGNOSIS — G453 Amaurosis fugax: Secondary | ICD-10-CM | POA: Diagnosis present

## 2024-08-20 LAB — ECHOCARDIOGRAM COMPLETE
AR max vel: 2.56 cm2
AV Area VTI: 2.66 cm2
AV Area mean vel: 2.56 cm2
AV Mean grad: 7 mmHg
AV Peak grad: 12.4 mmHg
Ao pk vel: 1.76 m/s
Area-P 1/2: 2.37 cm2
MV M vel: 2.13 m/s
MV Peak grad: 18.1 mmHg
P 1/2 time: 953 ms
S' Lateral: 3.49 cm

## 2024-08-20 NOTE — Progress Notes (Signed)
 I am seeing him in January, please pre-pone his visit with me to an earlier date and I would like to see him sooner, not urgent.  Let him know I will discuss the echo report.

## 2024-08-20 NOTE — Progress Notes (Signed)
 Normal carotid duplex

## 2024-09-02 ENCOUNTER — Ambulatory Visit (HOSPITAL_COMMUNITY)
Admission: RE | Admit: 2024-09-02 | Discharge: 2024-09-02 | Disposition: A | Discharge status: Home / Self Care | Source: Ambulatory Visit | Attending: Internal Medicine | Admitting: Internal Medicine

## 2024-09-02 DIAGNOSIS — Z789 Other specified health status: Secondary | ICD-10-CM | POA: Insufficient documentation

## 2024-09-02 DIAGNOSIS — J849 Interstitial pulmonary disease, unspecified: Secondary | ICD-10-CM | POA: Diagnosis present

## 2024-09-02 DIAGNOSIS — R0609 Other forms of dyspnea: Secondary | ICD-10-CM | POA: Diagnosis present

## 2024-09-02 DIAGNOSIS — Z8679 Personal history of other diseases of the circulatory system: Secondary | ICD-10-CM | POA: Diagnosis present

## 2024-09-19 ENCOUNTER — Encounter: Payer: Self-pay | Admitting: Cardiology

## 2024-09-19 ENCOUNTER — Ambulatory Visit: Attending: Cardiology | Admitting: Cardiology

## 2024-09-19 VITALS — BP 120/70 | HR 62 | Ht 70.0 in | Wt 211.0 lb

## 2024-09-19 DIAGNOSIS — G453 Amaurosis fugax: Secondary | ICD-10-CM | POA: Diagnosis not present

## 2024-09-19 DIAGNOSIS — I25118 Atherosclerotic heart disease of native coronary artery with other forms of angina pectoris: Secondary | ICD-10-CM

## 2024-09-19 DIAGNOSIS — I5022 Chronic systolic (congestive) heart failure: Secondary | ICD-10-CM | POA: Diagnosis not present

## 2024-09-19 DIAGNOSIS — J849 Interstitial pulmonary disease, unspecified: Secondary | ICD-10-CM | POA: Diagnosis not present

## 2024-09-19 DIAGNOSIS — N1832 Chronic kidney disease, stage 3b: Secondary | ICD-10-CM | POA: Diagnosis not present

## 2024-09-19 DIAGNOSIS — R0602 Shortness of breath: Secondary | ICD-10-CM

## 2024-09-19 MED ORDER — METOPROLOL TARTRATE 25 MG PO TABS: 25.0000 mg | ORAL_TABLET | Freq: Two times a day (BID) | ORAL | 2 refills | Status: DC

## 2024-09-19 NOTE — Progress Notes (Signed)
 Cardiology Office Note:  .   Date:  09/19/2024  ID:  Troy Sanchez, DOB [birth year], MRN [id number] PCP: Clarice Nottingham, MD  [facility] Providers Cardiologist:  Gordy Bergamo, MD   History of Present Illness: .   Troy Sanchez is a 83 y.o.  male with hypertension, hyperlipidemia, moderate CAD (cardiac catheterization in 2017 mid LAD 50% stenosis otherwise no significant disease), mild pulmonary hypertension, hyperglycemia, chronic dyspnea and also pulmonary fibrosis, esophageal strictures status post dilatation in the past presents for follow-up of dyspnea.  Nuclear stress test on 03/15/2022 revealing mild diaphragmatic attenuation artifact and probable minimal ischemia, normal EF, low restudy.  Echocardiogram on 03/09/2023 revealing normal LV systolic function, mild aortic regurgitation.   His main complaint is marked dyspnea on exertion even with minimal activities, has noticed episodes where his oxygen saturation drops down to 84 to 85% with activity.  I had seen him 3 months ago, at that point I discontinued aspirin  due to episodes suggestive of amaurosis fugax right eye and switched him to Plavix , I had also make recommendations regarding pulmonary evaluation including CT of the chest and to evaluate for nocturnal hypoxemia and to consider 6-minute walk test.  He now presents for follow-up of.  He is now in close evaluation with pulmonary medicine, he did not have significant hypoxemia on ambulation and did not need oxygen therapy per pulmonary recommendations, he is being followed for IPF with repeat CT scan on 09/03/2024 which shows progression of IPF, enlarged pulmonary trunk indicator of pulmonary arterial hypertension.  I repeated echocardiogram on 08/20/2024 revealing new onset moderate scratch that mild to moderate decrease in LVEF at 40 to 45% with global hypokinesis and flattened septum suggestive of pressure and volume overload from RV, RV severely enlarged with mild reduction in RV  function.  There was no TR to estimate PA pressure.    Discussed the use of AI scribe software for clinical note transcription with the patient, who gave verbal consent to proceed.  History of Present Illness Troy Sanchez is an 83 year old male with pulmonary fibrosis and coronary artery disease who presents with shortness of breath.  He has persistent exertional shortness of breath that limits daily activities. A six-minute walk test showed stable oxygen saturation. A high-resolution CT scan on September 03, 2024, showed slight progression of pulmonary fibrosis.  He has coronary artery disease with a 2017 heart catheterization showing 50% stenoses in the LAD and circumflex arteries. He currently has no angina. An echocardiogram on August 20, 2024, showed mildly reduced left ventricular function with an ejection fraction of 40 to 45 percent.  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 He has had no recurrent vision loss since. A carotid duplex on August 20, 2024, showed no plaque.  He takes atorvastatin  for hyperlipidemia and Plavix  for amaurosis fugax. He denies hypertension and reports blood pressure around 120/70. He confirms ongoing exertional shortness of breath without angina or new neurologic symptoms.   Cardiac Studies relevent.    ECHOCARDIOGRAM COMPLETE 08/20/2024  Right ventricular function is mildly reduced, RV is severely enlarged.  Cannot rule out small apical thrombus. LVEF by estimation is 40 to 45% with global hypokinesis.  Interventricular septum is flattened both in systole and diastole consistent with RV pressure and volume overload.  No tricuspid regurgitation to evaluate PA pressure. Mild aortic valve sclerosis. Compared to 03/09/2023, RV enlargement and dysfunction and LV dysfunction is new.  Previously LVEF 66%.  Carotid  artery duplex 08/20/2024: Mild intimal hyperplasia but otherwise no significant disease. Bilateral normal  vertebral artery flow and subclavian hemodynamics. Labs   Recent Labs    07/04/24 0903  NA 136  K 4.8  CL 98  CO2 24  GLUCOSE 83  BUN 9  CREATININE 1.47*  CALCIUM  9.4    Lab Results  Component Value Date   ALT 11 07/04/2024   AST 22 07/04/2024   ALKPHOS 100 07/04/2024   BILITOT 0.8 07/04/2024      Latest Ref Rng & Units 08/16/2024   10:51 AM 07/04/2024    9:03 AM 05/12/2023    9:16 AM  CBC  WBC 4.0 - 10.5 K/uL 4.0  4.3  4.4   Hemoglobin 13.0 - 17.0 g/dL 86.9  86.4  87.0   Hematocrit 39.0 - 52.0 % 39.7  44.7  39.6   Platelets 150.0 - 400.0 K/uL 162.0  181  199    No results found for: HGBA1C  Lab Results  Component Value Date   TSH 3.510 07/04/2024     ROS  Review of Systems  Cardiovascular:  Positive for dyspnea on exertion. Negative for chest pain, leg swelling, orthopnea and syncope.   Physical Exam:   VS:  BP 120/70 (BP Location: Left Arm, Patient Position: Sitting, Cuff Size: Normal)   Pulse 62   Ht 5' 10 (1.778 m)   Wt 211 lb (95.7 kg)   SpO2 96%   BMI 30.28 kg/m    Wt Readings from Last 3 Encounters:  09/19/24 211 lb (95.7 kg)  08/16/24 211 lb 12.8 oz (96.1 kg)  08/15/24 213 lb (96.6 kg)    BP Readings from Last 3 Encounters:  09/19/24 120/70  08/16/24 112/60  08/15/24 109/72   Physical Exam Neck:     Vascular: No carotid bruit or JVD.  Cardiovascular:     Rate and Rhythm: Normal rate and regular rhythm. Occasional Extrasystoles are present.    Pulses: Intact distal pulses.     Heart sounds: Normal heart sounds. No murmur heard.    No gallop.  Pulmonary:     Effort: Pulmonary effort is normal.     Breath sounds: Examination of the right-middle field reveals rhonchi. Examination of the left-middle field reveals rhonchi. Examination of the right-lower field reveals rhonchi. Examination of the left-lower field reveals rhonchi. Rhonchi present.  Abdominal:     General: Bowel sounds are normal.     Palpations: Abdomen is soft.   Musculoskeletal:     Right lower leg: No edema.     Left lower leg: No edema.    EKG:    EKG Interpretation Date/Time:  Wednesday September 19 2024 12:26:07 EST Ventricular Rate:  59 PR Interval:  178 QRS Duration:  144 QT Interval:  434 QTC Calculation: 429 R Axis:   -65  Text Interpretation: EKG 09/19/2024: Normal sinus rhythm at rate of 59 bpm, left intrafascicular block.  Right bundle branch block.  Bifascicular block.  Frequent PACs in trigeminal pattern.  Compared to 07/19/2024, no significant change. Confirmed by Tytionna Cloyd, Jagadeesh ([id number]) on 09/19/2024 2:59:18 PM    ASSESSMENT AND PLAN: .      ICD-10-CM   1. Shortness of breath  R06.02 Full code    EKG 12-Lead    Basic metabolic panel with GFR    CBC    2. Coronary artery disease involving native coronary artery of native heart with other form of angina pectoris  I25.118 Full code    metoprolol  tartrate (LOPRESSOR )  25 MG tablet    Basic metabolic panel with GFR    CBC    3. Chronic heart failure with mildly reduced ejection fraction (HFmrEF) (HCC)  I50.22 metoprolol  tartrate (LOPRESSOR ) 25 MG tablet    Basic metabolic panel with GFR    CBC    4. ILD (interstitial lung disease) (HCC)  J84.9 Basic metabolic panel with GFR    CBC    5. Amaurosis fugax of right eye  G45.3 Basic metabolic panel with GFR    CBC    6. Stage 3b chronic kidney disease (HCC)  N18.32 Basic metabolic panel with GFR    CBC     Assessment & Plan Chronic heart failure with mildly reduced ejection fraction (HFmrEF) Chronic biventricular heart failure with mildly reduced ejection fraction, with echocardiogram showing mildly reduced function of both left and right ventricles. Right ventricular enlargement and increased pressure likely secondary to interstitial lung disease. No active heart failure symptoms currently. - Started metoprolol  succinate 25 mg once daily to improve heart function. - Scheduled heart catheterization to assess for coronary  artery blockages and right heart pressures. - Discussed risks of heart catheterization, including less than 1-2% risk of death, stroke, heart attack, or urgent bypass surgery. - Obtained consent for CPR and life support during procedure.  Atherosclerotic heart disease of native coronary artery with angina equivalent Atherosclerotic heart disease with previous 50% blockage in LAD and circumflex arteries in 2017. No current angina, but shortness of breath is an angina equivalent. Heart catheterization planned to assess for progression of blockages. - Will perform heart catheterization to evaluate coronary artery blockages. - Will consider angioplasty if significant blockages are found during catheterization.  Interstitial lung disease (pulmonary fibrosis) Interstitial lung disease with slight worsening on recent high-resolution CT scan. Contributing to right heart enlargement and increased pressures. Follow-up with pulmonologist scheduled. - Continue follow-up with pulmonologist, Dr. Geronimo, on December 17th for pulmonary function tests and evaluation.  Amaurosis fugax, right eye Amaurosis fugax in the right eye has resolved and no recurrence over the past 3 months, previously managed with aspirin , now switched to Plavix . - Continue Plavix  for amaurosis fugax management. - Carotid artery duplex reviewed, no significant abnormality.   Follow up: 6-8 weeks post R + L Heart catheterization  Signed,  Gordy Bergamo, MD, [facility] 09/19/2024, 3:01 PM [facility] [address] [place], [place] [address] Phone: [phone]. Fax:  [phone]

## 2024-09-19 NOTE — H&P (View-Only) (Signed)
 Cardiology Office Note:  .   Date:  09/19/2024  ID:  Troy Sanchez, DOB [birth year], MRN [id number] PCP: Troy Nottingham, MD  Troy Sanchez Providers Cardiologist:  Troy Bergamo, MD   History of Present Illness: .   Troy Sanchez is a 83 y.o.  male with hypertension, hyperlipidemia, moderate CAD (cardiac catheterization in 2017 mid LAD 50% stenosis otherwise no significant disease), mild pulmonary hypertension, hyperglycemia, chronic dyspnea and also pulmonary fibrosis, esophageal strictures status post dilatation in the past presents for follow-up of dyspnea.  Nuclear stress test on 03/15/2022 revealing mild diaphragmatic attenuation artifact and probable minimal ischemia, normal EF, low restudy.  Echocardiogram on 03/09/2023 revealing normal LV systolic function, mild aortic regurgitation.   His main complaint is marked dyspnea on exertion even with minimal activities, has noticed episodes where his oxygen saturation drops down to 84 to 85% with activity.  I had seen him 3 months ago, at that point I discontinued aspirin  due to episodes suggestive of amaurosis fugax right eye and switched him to Plavix , I had also make recommendations regarding pulmonary evaluation including CT of the chest and to evaluate for nocturnal hypoxemia and to consider 6-minute walk test.  He now presents for follow-up of.  He is now in close evaluation with pulmonary medicine, he did not have significant hypoxemia on ambulation and did not need oxygen therapy per pulmonary recommendations, he is being followed for IPF with repeat CT scan on 09/03/2024 which shows progression of IPF, enlarged pulmonary trunk indicator of pulmonary arterial hypertension.  I repeated echocardiogram on 08/20/2024 revealing new onset moderate scratch that mild to moderate decrease in LVEF at 40 to 45% with global hypokinesis and flattened septum suggestive of pressure and volume overload from RV, RV severely enlarged with mild reduction in RV  function.  There was no TR to estimate PA pressure.    Discussed the use of AI scribe software for clinical note transcription with the patient, who gave verbal consent to proceed.  History of Present Illness Troy Sanchez is an 83 year old male with pulmonary fibrosis and coronary artery disease who presents with shortness of breath.  He has persistent exertional shortness of breath that limits daily activities. A six-minute walk test showed stable oxygen saturation. A high-resolution CT scan on September 03, 2024, showed slight progression of pulmonary fibrosis.  He has coronary artery disease with a 2017 heart catheterization showing 50% stenoses in the LAD and circumflex arteries. He currently has no angina. An echocardiogram on August 20, 2024, showed mildly reduced left ventricular function with an ejection fraction of 40 to 45 percent.  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 He has had no recurrent vision loss since. A carotid duplex on August 20, 2024, showed no plaque.  He takes atorvastatin  for hyperlipidemia and Plavix  for amaurosis fugax. He denies hypertension and reports blood pressure around 120/70. He confirms ongoing exertional shortness of breath without angina or new neurologic symptoms.   Cardiac Studies relevent.    ECHOCARDIOGRAM COMPLETE 08/20/2024  Right ventricular function is mildly reduced, RV is severely enlarged.  Cannot rule out small apical thrombus. LVEF by estimation is 40 to 45% with global hypokinesis.  Interventricular septum is flattened both in systole and diastole consistent with RV pressure and volume overload.  No tricuspid regurgitation to evaluate PA pressure. Mild aortic valve sclerosis. Compared to 03/09/2023, RV enlargement and dysfunction and LV dysfunction is new.  Previously LVEF 66%.  Carotid  artery duplex 08/20/2024: Mild intimal hyperplasia but otherwise no significant disease. Bilateral normal  vertebral artery flow and subclavian hemodynamics. Labs   Recent Labs    07/04/24 0903  NA 136  K 4.8  CL 98  CO2 24  GLUCOSE 83  BUN 9  CREATININE 1.47*  CALCIUM  9.4    Lab Results  Component Value Date   ALT 11 07/04/2024   AST 22 07/04/2024   ALKPHOS 100 07/04/2024   BILITOT 0.8 07/04/2024      Latest Ref Rng & Units 08/16/2024   10:51 AM 07/04/2024    9:03 AM 05/12/2023    9:16 AM  CBC  WBC 4.0 - 10.5 K/uL 4.0  4.3  4.4   Hemoglobin 13.0 - 17.0 g/dL 86.9  86.4  87.0   Hematocrit 39.0 - 52.0 % 39.7  44.7  39.6   Platelets 150.0 - 400.0 K/uL 162.0  181  199    No results found for: HGBA1C  Lab Results  Component Value Date   TSH 3.510 07/04/2024     ROS  Review of Systems  Cardiovascular:  Positive for dyspnea on exertion. Negative for chest pain, leg swelling, orthopnea and syncope.   Physical Exam:   VS:  BP 120/70 (BP Location: Left Arm, Patient Position: Sitting, Cuff Size: Normal)   Pulse 62   Ht 5' 10 (1.778 m)   Wt 211 lb (95.7 kg)   SpO2 96%   BMI 30.28 kg/m    Wt Readings from Last 3 Encounters:  09/19/24 211 lb (95.7 kg)  08/16/24 211 lb 12.8 oz (96.1 kg)  08/15/24 213 lb (96.6 kg)    BP Readings from Last 3 Encounters:  09/19/24 120/70  08/16/24 112/60  08/15/24 109/72   Physical Exam Neck:     Vascular: No carotid bruit or JVD.  Cardiovascular:     Rate and Rhythm: Normal rate and regular rhythm. Occasional Extrasystoles are present.    Pulses: Intact distal pulses.     Heart sounds: Normal heart sounds. No murmur heard.    No gallop.  Pulmonary:     Effort: Pulmonary effort is normal.     Breath sounds: Examination of the right-middle field reveals rhonchi. Examination of the left-middle field reveals rhonchi. Examination of the right-lower field reveals rhonchi. Examination of the left-lower field reveals rhonchi. Rhonchi present.  Abdominal:     General: Bowel sounds are normal.     Palpations: Abdomen is soft.   Musculoskeletal:     Right lower leg: No edema.     Left lower leg: No edema.    EKG:    EKG Interpretation Date/Time:  Wednesday September 19 2024 12:26:07 EST Ventricular Rate:  59 PR Interval:  178 QRS Duration:  144 QT Interval:  434 QTC Calculation: 429 R Axis:   -65  Text Interpretation: EKG 09/19/2024: Normal sinus rhythm at rate of 59 bpm, left intrafascicular block.  Right bundle branch block.  Bifascicular block.  Frequent PACs in trigeminal pattern.  Compared to 07/19/2024, no significant change. Confirmed by Troy Sanchez, Troy Sanchez ([id number]) on 09/19/2024 2:59:18 PM    ASSESSMENT AND PLAN: .      ICD-10-CM   1. Shortness of breath  R06.02 Full code    EKG 12-Lead    Basic metabolic panel with GFR    CBC    2. Coronary artery disease involving native coronary artery of native heart with other form of angina pectoris  I25.118 Full code    metoprolol  tartrate (LOPRESSOR )  25 MG tablet    Basic metabolic panel with GFR    CBC    3. Chronic heart failure with mildly reduced ejection fraction (HFmrEF) (HCC)  I50.22 metoprolol  tartrate (LOPRESSOR ) 25 MG tablet    Basic metabolic panel with GFR    CBC    4. ILD (interstitial lung disease) (HCC)  J84.9 Basic metabolic panel with GFR    CBC    5. Amaurosis fugax of right eye  G45.3 Basic metabolic panel with GFR    CBC    6. Stage 3b chronic kidney disease (HCC)  N18.32 Basic metabolic panel with GFR    CBC     Assessment & Plan Chronic heart failure with mildly reduced ejection fraction (HFmrEF) Chronic biventricular heart failure with mildly reduced ejection fraction, with echocardiogram showing mildly reduced function of both left and right ventricles. Right ventricular enlargement and increased pressure likely secondary to interstitial lung disease. No active heart failure symptoms currently. - Started metoprolol  succinate 25 mg once daily to improve heart function. - Scheduled heart catheterization to assess for coronary  artery blockages and right heart pressures. - Discussed risks of heart catheterization, including less than 1-2% risk of death, stroke, heart attack, or urgent bypass surgery. - Obtained consent for CPR and life support during procedure.  Atherosclerotic heart disease of native coronary artery with angina equivalent Atherosclerotic heart disease with previous 50% blockage in LAD and circumflex arteries in 2017. No current angina, but shortness of breath is an angina equivalent. Heart catheterization planned to assess for progression of blockages. - Will perform heart catheterization to evaluate coronary artery blockages. - Will consider angioplasty if significant blockages are found during catheterization.  Interstitial lung disease (pulmonary fibrosis) Interstitial lung disease with slight worsening on recent high-resolution CT scan. Contributing to right heart enlargement and increased pressures. Follow-up with pulmonologist scheduled. - Continue follow-up with pulmonologist, Dr. Geronimo, on December 17th for pulmonary function tests and evaluation.  Amaurosis fugax, right eye Amaurosis fugax in the right eye has resolved and no recurrence over the past 3 months, previously managed with aspirin , now switched to Plavix . - Continue Plavix  for amaurosis fugax management. - Carotid artery duplex reviewed, no significant abnormality.   Follow up: 6-8 weeks post R + L Heart catheterization  Signed,  Troy Bergamo, MD, [facility] 09/19/2024, 3:01 PM [facility] [address] [place], [place] [address] Phone: [phone]. Fax:  [phone]

## 2024-09-19 NOTE — Patient Instructions (Addendum)
 Medication Instructions:  START Metorprolol Tartrate 25mg  Take 1 tablet (25 mg total) by mouth 2 (two) times daily.  *If you need a refill on your cardiac medications before your next appointment, please call your pharmacy*  Lab Work: CBC, BMP (today) If you have labs (blood work) drawn today and your tests are completely normal, you will receive your results only by: MyChart Message (if you have MyChart) OR A paper copy in the mail If you have any lab test that is abnormal or we need to change your treatment, we will call you to review the results.  Testing/Procedures: Your physician has requested that you have a cardiac catheterization. Cardiac catheterization is used to diagnose and/or treat various heart conditions. Doctors may recommend this procedure for a number of different reasons. The most common reason is to evaluate chest pain. Chest pain can be a symptom of coronary artery disease (CAD), and cardiac catheterization can show whether plaque is narrowing or blocking your hearts arteries. This procedure is also used to evaluate the valves, as well as measure the blood flow and oxygen levels in different parts of your heart. For further information please visit [email or web address] Please follow instruction sheet, as given.   Follow-Up: At [facility], you and your health needs are our priority.  As part of our continuing mission to provide you with exceptional heart care, our providers are all part of one team.  This team includes your primary Cardiologist (physician) and Advanced Practice Providers or APPs (Physician Assistants and Nurse Practitioners) who all work together to provide you with the care you need, when you need it.  Your next appointment:   1 month(s) (after Cath)  Provider:   Gordy Bergamo, MD  We recommend signing up for the patient portal called MyChart.  Sign up information is provided on this After Visit Summary.  MyChart is used to connect with  patients for Virtual Visits (Telemedicine).  Patients are able to view lab/test results, encounter notes, upcoming appointments, etc.  Non-urgent messages can be sent to your provider as well.   To learn more about what you can do with MyChart, go to [email or web address].   Other Instructions  [facility] DE[facility] OF [place] AT [address] A DEPT OF THE . [facility] [address]  [place] [address] Dept: [phone] Loc: [phone]  HARRINGTON JOBE  09/19/2024  You are scheduled for a Cardiac Catheterization on Wednesday, January 7 with Dr. Gordy Bergamo.  1. Please arrive at the [facility] (Main Entrance A) at [facility]: [address] [place], [place] [address] at 8:00 AM (This time is 2 hour(s) before your procedure to ensure your preparation).   Free valet parking service is available. You will check in at ADMITTING. The support person will be asked to wait in the waiting room.  It is OK to have someone drop you off and come back when you are ready to be discharged.    Special note: Every effort is made to have your procedure done on time. Please understand that emergencies sometimes delay scheduled procedures.  2. Diet: Nothing to eat after midnight.   3. Hydration: You need to be well hydrated before your procedure. On January 7, you may drink approved liquids (see below) until 2 hours before the procedure, with 16 oz of water as your last intake.   List of approved liquids water, clear juice, clear tea, black coffee, fruit juices, non-citric  and without pulp, carbonated beverages, Gatorade, Kool -Aid, plain Jello-O and plain ice popsicles.  4. Labs: You will need to have blood drawn on TODAY(CBC, BMP) You do not need to be fasting.  5. Medication instructions in preparation for your procedure:   Contrast Allergy: No   On the morning of your procedure, take your Plavix /Clopidogrel  and any morning medicines  NOT listed above.  Remember to drink a bottle of water the morning of with you Plavix .   6. Plan to go home the same day, you will only stay overnight if medically necessary. 7. Bring a current list of your medications and current insurance cards. 8. You MUST have a responsible person to drive you home. 9. Someone MUST be with you the first 24 hours after you arrive home or your discharge will be delayed. 10. Please wear clothes that are easy to get on and off and wear slip-on shoes.  Thank you for allowing us  to care for you!   -- [facility]

## 2024-09-20 ENCOUNTER — Ambulatory Visit: Payer: Self-pay | Admitting: Cardiology

## 2024-09-20 LAB — CBC
Hematocrit: 43.8 % (ref 37.5–51.0)
Hemoglobin: 13.5 g/dL (ref 13.0–17.7)
MCH: 24.9 pg — ABNORMAL LOW (ref 26.6–33.0)
MCHC: 30.8 g/dL — ABNORMAL LOW (ref 31.5–35.7)
MCV: 81 fL (ref 79–97)
Platelets: 179 x10E3/uL (ref 150–450)
RBC: 5.43 x10E6/uL (ref 4.14–5.80)
RDW: 14.9 % (ref 11.6–15.4)
WBC: 5.4 x10E3/uL (ref 3.4–10.8)

## 2024-09-20 LAB — BASIC METABOLIC PANEL WITH GFR
BUN/Creatinine Ratio: 9 — ABNORMAL LOW (ref 10–24)
BUN: 11 mg/dL (ref 8–27)
CO2: 25 mmol/L (ref 20–29)
Calcium: 9.8 mg/dL (ref 8.6–10.2)
Chloride: 99 mmol/L (ref 96–106)
Creatinine, Ser: 1.28 mg/dL — ABNORMAL HIGH (ref 0.76–1.27)
Glucose: 89 mg/dL (ref 70–99)
Potassium: 4.4 mmol/L (ref 3.5–5.2)
Sodium: 138 mmol/L (ref 134–144)
eGFR: 56 mL/min/1.73 — ABNORMAL LOW (ref >59)

## 2024-09-20 NOTE — Progress Notes (Signed)
 Labs are stable to proceed with cardiac catheterization.

## 2024-09-26 ENCOUNTER — Ambulatory Visit: Admitting: Nurse Practitioner

## 2024-09-26 ENCOUNTER — Ambulatory Visit (INDEPENDENT_AMBULATORY_CARE_PROVIDER_SITE_OTHER)

## 2024-09-26 ENCOUNTER — Encounter: Payer: Self-pay | Admitting: Nurse Practitioner

## 2024-09-26 VITALS — BP 102/66 | HR 51 | Temp 97.9°F | Ht 70.0 in | Wt 211.2 lb

## 2024-09-26 DIAGNOSIS — R7689 Other specified abnormal immunological findings in serum: Secondary | ICD-10-CM | POA: Diagnosis not present

## 2024-09-26 DIAGNOSIS — J849 Interstitial pulmonary disease, unspecified: Secondary | ICD-10-CM

## 2024-09-26 DIAGNOSIS — Z8679 Personal history of other diseases of the circulatory system: Secondary | ICD-10-CM

## 2024-09-26 DIAGNOSIS — R0609 Other forms of dyspnea: Secondary | ICD-10-CM

## 2024-09-26 DIAGNOSIS — I5020 Unspecified systolic (congestive) heart failure: Secondary | ICD-10-CM | POA: Diagnosis not present

## 2024-09-26 DIAGNOSIS — I50810 Right heart failure, unspecified: Secondary | ICD-10-CM

## 2024-09-26 DIAGNOSIS — Z789 Other specified health status: Secondary | ICD-10-CM

## 2024-09-26 LAB — PULMONARY FUNCTION TEST
DL/VA % pred: 87 %
DL/VA: 3.37 ml/min/mmHg/L
DLCO cor % pred: 54 %
DLCO cor: 13.14 ml/min/mmHg
DLCO unc % pred: 51 %
DLCO unc: 12.5 ml/min/mmHg
FEF 25-75 Pre: 1.34 L/s
FEF2575-%Pred-Pre: 71 %
FEV1-%Pred-Pre: 68 %
FEV1-Pre: 1.91 L
FEV1FVC-%Pred-Pre: 101 %
FEV6-%Pred-Pre: 72 %
FEV6-Pre: 2.65 L
FEV6FVC-%Pred-Pre: 107 %
FVC-%Pred-Pre: 67 %
FVC-Pre: 2.66 L
Pre FEV1/FVC ratio: 72 %
Pre FEV6/FVC Ratio: 100 %

## 2024-09-26 NOTE — Progress Notes (Signed)
 @Patient  ID: Troy Sanchez, male    DOB: [birth year], 83 y.o.   MRN: [id number]  Chief Complaint  Patient presents with   Medical Management of Chronic Issues    Pft F/U Pt states that he has been not as good since LOV. Pt states that he can't do much anymore States his SOB has gotten a little worse, it is only with exertion.     Referring provider: Clarice Nottingham, MD  HPI: 83 year old male, never smoker followed for DOE, ILD. He was previously followed by Dr. Darlean and transitioned to Dr. Geronimo to establish with ILD clinic. Last seen 08/16/2024. Past medical history significant HTN, PH, CAD, GERD, SBO, BPH, IDA, HLD.   TEST/EVENTS:  08/16/2024 ANA 1:40, rheumatoid factor positive  08/20/2024 echo: RV function mildly reduced and severely enlarged. Cannot r/o small apical thrombus. EF 40-45%. Interventricular septum flattening, consistent with RV pressure and volume overload. AR mild. Moderate dilation of ascending aorta 09/02/2024 HRCT chest: atherosclerosis. Enlarged pulmonic trunk. 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  09/26/2024 PFT: FVC 67, FEV1 68, ratio 72, DLCOcor 54   08/16/2024: OV with Dr. Geronimo. Referred by Dr. Darlean for worsening SOB and hx of ILD. Has PH diagnosed several years ago. Symptoms were previously stable and scans previously stable. SOB has progressively worsened over the last 6 months to a year. Significant DOE. If he takes stairs slowly, can manage without stopping. If he does it at a normal pace, becomes winded. Does have head heaviness and lightheadedness/dizziness. Has had prior CTs of his head that have been normal. No hx of OSA. Exertional oxygen study with SpO2 low 90% on room air. No need for O2 at this time. Concern for worsening PF vs new heart issue. Labs ordered including autoimmune panel. Spiro/DLCO ordered. HRCT chest.   09/26/2024: Today - follow up Discussed the use of AI scribe software for clinical note transcription  with the patient, who gave verbal consent to proceed.  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  He has experienced worsening shortness of breath over the past six months to a year. His pulmonary function tests have declined compared to five years ago, and a CT scan shows slight worsening of lung fibrosis since 2020. He feels stable compared to his last visit. Occasional dry cough. No wheezing, leg swelling, orthopnea, PND, CP, fevers, weight loss, anorexia.   An echocardiogram revealed a significant decrease in cardiac function, with both the left and right sides of the heart not working properly. He is scheduled for a heart catheterization with cardiology in a few weeks and plans to see them afterwards to review.   No joint pain or rashes. His rheumatoid factor was positive, but he has not experienced any symptoms suggestive of rheumatoid arthritis.     Allergies[1]  Immunization History  Administered Date(s) Administered   Pneumococcal Conjugate-13 03/26/2015   Tdap 12/11/2018    Past Medical History:  Diagnosis Date   CAD (coronary artery disease) 07/02/2019   Esophageal motility disorder    Esophageal stenosis    torturous esophagus   GERD (gastroesophageal reflux disease) 01/07/2021   Hiatal hernia    HTN (hypertension) 07/02/2019   Hypertension    Recurrent Clostridium difficile diarrhea    Zenker diverticulum     Tobacco History: Tobacco Use History[2] Counseling given: Not Answered   Outpatient Medications Prior to Visit  Medication Sig  Dispense Refill   atorvastatin  (LIPITOR) 10 MG tablet Take 10 mg by mouth daily.     carboxymethylcellulose (REFRESH PLUS) 0.5 % SOLN 1 drop 3 (three) times daily as needed.     cetirizine (ZYRTEC) 10 MG tablet Take 10 mg by mouth daily.     cholecalciferol  (VITAMIN D ) 1000 UNITS tablet Take 1,000 Units by mouth every morning.       clopidogrel  (PLAVIX ) 75 MG tablet Take 1 tablet (75 mg total) by mouth daily. 90 tablet 3   cyanocobalamin  (VITAMIN B12) 1000 MCG tablet Take 1,000 mcg by mouth daily.     escitalopram (LEXAPRO) 5 MG tablet Take 5 mg by mouth daily.     famotidine  (PEPCID ) 20 MG tablet ONE AFTER SUPPER 90 tablet 3   metoprolol  tartrate (LOPRESSOR ) 25 MG tablet Take 1 tablet (25 mg total) by mouth 2 (two) times daily. 60 tablet 2   pantoprazole  (PROTONIX ) 40 MG tablet Take 1 tablet (40 mg total) by mouth daily before breakfast. 90 tablet 0   magnesium  oxide (MAG-OX) 400 MG tablet Take 400 mg by mouth daily. (Patient not taking: Reported on 09/26/2024)     No facility-administered medications prior to visit.     Review of Systems: as above    Physical Exam:  BP 102/66   Pulse (!) 51   Temp 97.9 F (36.6 C)   Ht 5' 10 (1.778 m) Comment: Per pt  Wt 211 lb 3.2 oz (95.8 kg)   SpO2 95% Comment: RA  BMI 30.30 kg/m   GEN: Pleasant, interactive, well-kempt; obese; in no acute distress HEENT:  Normocephalic and atraumatic. PERRLA. Sclera white. Nasal turbinates pink, moist and patent bilaterally. No rhinorrhea present. Oropharynx pink and moist, without exudate or edema. No lesions, ulcerations, or postnasal drip.  NECK:  Supple w/ fair ROM. No lymphadenopathy.   CV: RRR, no m/r/g, no peripheral edema. Pulses intact, +2 bilaterally. No cyanosis, pallor or clubbing. PULMONARY:  Unlabored, regular breathing. Mild bibasilar crackles bilaterally otherwise clear A&P w/o wheezes/rales/rhonchi. No accessory muscle use.  GI: BS present and normoactive. Soft, non-tender to palpation. MSK: No erythema, warmth or tenderness. Cap refil <2 sec all extrem. Neuro: A/Ox3. No focal deficits noted.   Skin: Warm, no lesions or rashe Psych: Normal affect and behavior. Judgement and thought content appropriate.     Lab Results:  CBC    Component Value Date/Time   WBC 5.4 09/19/2024 1256   WBC 4.0 08/16/2024 1051    RBC 5.43 09/19/2024 1256   RBC 5.25 08/16/2024 1051   HGB 13.5 09/19/2024 1256   HCT 43.8 09/19/2024 1256   PLT 179 09/19/2024 1256   MCV 81 09/19/2024 1256   MCH 24.9 (L) 09/19/2024 1256   MCH 30.1 06/09/2021 0010   MCHC 30.8 (L) 09/19/2024 1256   MCHC 32.8 08/16/2024 1051   RDW 14.9 09/19/2024 1256   LYMPHSABS 1.0 08/16/2024 1051   LYMPHSABS 1.2 07/04/2024 0903   MONOABS 0.4 08/16/2024 1051   EOSABS 0.2 08/16/2024 1051   EOSABS 0.1 07/04/2024 0903   BASOSABS 0.1 08/16/2024 1051   BASOSABS 0.1 07/04/2024 0903    BMET    Component Value Date/Time   NA 138 09/19/2024 1256   K 4.4 09/19/2024 1256   CL 99 09/19/2024 1256   CO2 25 09/19/2024 1256   GLUCOSE 89 09/19/2024 1256   GLUCOSE 113 (H) 06/09/2021 0010   BUN 11 09/19/2024 1256   CREATININE 1.28 (H) 09/19/2024 1256   CALCIUM  9.8 09/19/2024 1256  GFRNONAA 43 (L) 06/09/2021 0010   GFRAA >60 07/04/2020 0716    BNP    Component Value Date/Time   BNP 21.6 07/04/2024 0903     Imaging:  CT Chest High Resolution Result Date: 09/03/2024 CLINICAL DATA:  Diffuse/interstitial lung disease. Pulmonary hypertension, shortness of breath on exertion. EXAM: CT CHEST WITHOUT CONTRAST TECHNIQUE: Multidetector CT imaging of the chest was performed following the standard protocol without intravenous contrast. High resolution imaging of the lungs, as well as inspiratory and expiratory imaging, was performed. RADIATION DOSE REDUCTION: This exam was performed according to the departmental dose-optimization program which includes automated exposure control, adjustment of the mA and/or kV according to patient size and/or use of iterative reconstruction technique. COMPARISON:  06/01/2019. FINDINGS: Cardiovascular: Atherosclerotic calcification of the aorta, aortic valve and coronary arteries. Ascending aorta measures 3.9 cm (coronal image 68). Enlarged pulmonic trunk and heart. No pericardial effusion. Mediastinum/Nodes: No pathologically  enlarged mediastinal or axillary lymph nodes. Hilar regions are difficult to definitively evaluate without IV contrast. Air in the esophagus can be seen with dysmotility. Lungs/Pleura: Peripheral and basilar predominant traction bronchiectasis/bronchiolectasis, subpleural coarsened ground-glass and subpleural reticulation slightly progressive from 06/01/2019. No pleural fluid. Airway is unremarkable. Expiratory phase imaging was not performed in true expiration, limiting the evaluation for air trapping. Upper Abdomen: Small right hepatic lobe cyst. No specific follow-up necessary. Cholecystectomy. Hiatal hernia contains a portion of the stomach, pancreatic body and tail and splenic vessels. Visualized portions of the liver, adrenal glands, kidneys, spleen, pancreas, stomach and bowel are otherwise grossly unremarkable. No upper abdominal adenopathy. Musculoskeletal: Degenerative changes in the spine. IMPRESSION: 1. Pulmonary parenchymal pattern of interstitial lung disease, as detailed above, slightly progressive from 06/01/2019. Findings are categorized as probable UIP per consensus guidelines: Diagnosis of Idiopathic Pulmonary Fibrosis: An Official ATS/ERS/JRS/ALAT Clinical Practice Guideline. Am JINNY [employer] Crit Care Med Vol 198, Iss 5, [phone], Jun 11 2017. 2. Aortic atherosclerosis (ICD10-I70.0). Coronary artery calcification. 3. Enlarged pulmonic trunk, indicative of pulmonary arterial hypertension. Electronically Signed   By: Newell Eke M.D.   On: 09/03/2024 13:28    Administration History     None          Latest Ref Rng & Units 09/26/2024    1:59 PM 05/08/2019    8:46 AM 12/09/2017    8:39 AM  PFT Results  FVC-Pre L 2.66  P 3.74  3.82   FVC-Predicted Pre % 67  P 89  90   FVC-Post L  3.69  3.89   FVC-Predicted Post %  88  92   Pre FEV1/FVC % % 72  P 80  80   Post FEV1/FCV % %  82  81   FEV1-Pre L 1.91  P 2.98  3.04   FEV1-Predicted Pre % 68  P 98  99   FEV1-Post L  3.02  3.16   DLCO  uncorrected ml/min/mmHg 12.50  P 16.92  19.63   DLCO UNC% % 51  P 67  60   DLCO corrected ml/min/mmHg 13.14  P    DLCO COR %Predicted % 54  P    DLVA Predicted % 87  P 85  87   TLC L  5.71  5.65   TLC % Predicted %  81  80   RV % Predicted %  76  72     P Preliminary result    No results found for: NITRICOXIDE      Assessment & Plan:   Assessment & Plan 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 Likely contributing to worsening restriction and decline in DLCO. Awaiting cardiac cath and further management with cardiology. No acute symptoms. Reviewed ED precautions today. Not overtly hypervolemic on exam. Advised to monitor for s/s of fluid retention. Suspect this is the driving factor of his progressive DOE. Will reassess at follow up  - Proceed with cardiac catheterization in January - Monitor for signs of fluid retention, such as leg swelling or weight gain - Await cardiology consultation post-catheterization for potential medication adjustments - Consider diuretic therapy to manage fluid overload  Interstitial lung disease (pulmonary fibrosis) Decline in lung function tests and slight progression of fibrosis on CT scan since 2020. Unclear if true disease progression vs pulmonary edema given recent findings. See above plan. Will plan to repeat PFT at follow up. May consider antifibrotic therapy pending results and response to HF treatment. Reviewed progressive nature of disease.  - Will repeat lung function test in 12 weeks and post-cardiac catheterization - Will evaluate need for medication to slow pulmonary fibrosis progression after cardiac evaluation  Rheumatoid factor positive Positive rheumatoid factor, potentially indicating rheumatoid arthritis, which can affect the lungs. No systemic symptoms reported so  possible false positive. Will have him undergo rheumatology workup for further evaluation given lung disease.  - Referred to rheumatology for further evaluation of rheumatoid factor positivity  Apical thrombus Possible small apical thrombus noted on echo. Cardiology aware of results per note in chart. ED precautions reviewed. Follow up with cardiology as scheduled     Advised if symptoms do not improve or worsen, to please contact office for sooner follow up or seek emergency care.   I spent 45 minutes of dedicated to the care of this patient on the date of this encounter to include pre-visit review of records, face-to-face time with the patient discussing conditions above, post visit ordering of testing, clinical documentation with the electronic health record, making appropriate referrals as documented, and communicating necessary findings to members of the patients care team.  Comer LULLA Rouleau, NP 09/26/2024  Pt aware and understands NP's role.      [1] No Known Allergies [2]  Social History Tobacco Use  Smoking Status Never  Smokeless Tobacco Never

## 2024-09-26 NOTE — Patient Instructions (Signed)
Spiro/DLCO performed today. 

## 2024-09-26 NOTE — Progress Notes (Signed)
Spiro/DLCO performed today. 

## 2024-09-26 NOTE — Patient Instructions (Addendum)
 Unfortunately, your heart's pumping function has declined since your last echocardiogram. You also have a lot of strain on the right side of your heart. This explains the shortness of breath you have been struggling with and can just make you feel more fatigued/run down.  You also had a decline in your lung function and gas exchange, which could be due to fluid overload from the heart not working well. It's also hard to say if your CT chest scan showed worsening fibrosis changes or if the slight progression from 2020 is also related to your heart  We discussed findings today. Plan is to follow up with your heart team and attend your heart catheterization  After this, we will repeat your testing and decide if we need to put you on an antifibrotic medication to help slow down the scarring in your lungs   You also had a positive rheumatoid factor on your testing. I am going to send you to rheumatology to work this up further, and ensure that rheumatoid arthritis is not playing a factor   Keep an eye on your oxygen levels at home. Goal is above 88-90%  If you develop significant worsening or low oxygen levels, go to the hospital  If you gain 2-3 lb overnight or 5-7 lb in a week, or swelling in your legs, let your heart doctor know   Follow up in 10-12 weeks after spiro/DCLO with Dr. Geronimo. If symptoms do not improve or worsen, please contact office for sooner follow up or seek emergency care.

## 2024-10-05 ENCOUNTER — Ambulatory Visit: Payer: Self-pay | Admitting: Internal Medicine

## 2024-10-08 NOTE — Progress Notes (Signed)
 Called and relayed results to pt and he confirmed understanding

## 2024-10-14 ENCOUNTER — Other Ambulatory Visit: Payer: Self-pay | Admitting: Cardiology

## 2024-10-14 DIAGNOSIS — K219 Gastro-esophageal reflux disease without esophagitis: Secondary | ICD-10-CM

## 2024-10-16 ENCOUNTER — Ambulatory Visit: Admitting: Cardiology

## 2024-10-16 ENCOUNTER — Telehealth: Payer: Self-pay | Admitting: *Deleted

## 2024-10-16 NOTE — Telephone Encounter (Signed)
 Cardiac Catheterization scheduled at [facility] for: Wednesday October 17, 2024 10 AM Arrival time [facility] Main Entrance A at: 8 AM  Diet: -Nothing to eat after midnight.  Hydration: -May drink clear liquids until 2 hours before the procedure.  Approved liquids: Water , clear tea, black coffee, fruit juices-non-citric and without pulp,Gatorade, plain Jello/popsicles.   -Please drink 8  oz of water  2 hours before procedure.  Medication instructions: -Usual morning medications can be taken including aspirin  81 mg and Plavix  75 mg.   Plan to go home the same day, you will only stay overnight if medically necessary.  You must have responsible adult to drive you home.  Someone must be with you the first 24 hours after you arrive home.  Reviewed procedure instructions with patient.

## 2024-10-17 ENCOUNTER — Encounter (HOSPITAL_COMMUNITY): Payer: Self-pay | Admitting: Cardiology

## 2024-10-17 ENCOUNTER — Other Ambulatory Visit: Payer: Self-pay

## 2024-10-17 ENCOUNTER — Ambulatory Visit (HOSPITAL_COMMUNITY)
Admission: RE | Admit: 2024-10-17 | Discharge: 2024-10-17 | Disposition: A | Discharge status: Home / Self Care | Attending: Cardiology | Admitting: Cardiology

## 2024-10-17 ENCOUNTER — Encounter (HOSPITAL_COMMUNITY): Admission: RE | Disposition: A | Payer: Self-pay | Source: Home / Self Care | Attending: Cardiology

## 2024-10-17 DIAGNOSIS — R0602 Shortness of breath: Secondary | ICD-10-CM

## 2024-10-17 DIAGNOSIS — Z79899 Other long term (current) drug therapy: Secondary | ICD-10-CM | POA: Insufficient documentation

## 2024-10-17 DIAGNOSIS — I2510 Atherosclerotic heart disease of native coronary artery without angina pectoris: Secondary | ICD-10-CM

## 2024-10-17 DIAGNOSIS — I428 Other cardiomyopathies: Secondary | ICD-10-CM | POA: Diagnosis not present

## 2024-10-17 DIAGNOSIS — I5022 Chronic systolic (congestive) heart failure: Secondary | ICD-10-CM | POA: Diagnosis not present

## 2024-10-17 DIAGNOSIS — Z7902 Long term (current) use of antithrombotics/antiplatelets: Secondary | ICD-10-CM | POA: Diagnosis not present

## 2024-10-17 DIAGNOSIS — J849 Interstitial pulmonary disease, unspecified: Secondary | ICD-10-CM | POA: Diagnosis not present

## 2024-10-17 DIAGNOSIS — I25118 Atherosclerotic heart disease of native coronary artery with other forms of angina pectoris: Secondary | ICD-10-CM | POA: Insufficient documentation

## 2024-10-17 DIAGNOSIS — G453 Amaurosis fugax: Secondary | ICD-10-CM | POA: Diagnosis not present

## 2024-10-17 DIAGNOSIS — N1832 Chronic kidney disease, stage 3b: Secondary | ICD-10-CM | POA: Diagnosis not present

## 2024-10-17 DIAGNOSIS — E785 Hyperlipidemia, unspecified: Secondary | ICD-10-CM | POA: Diagnosis not present

## 2024-10-17 HISTORY — PX: RIGHT/LEFT HEART CATH AND CORONARY ANGIOGRAPHY: CATH118266

## 2024-10-17 LAB — POCT I-STAT EG7
Acid-base deficit: 1 mmol/L (ref 0.0–2.0)
Acid-base deficit: 1 mmol/L (ref 0.0–2.0)
Acid-base deficit: 1 mmol/L (ref 0.0–2.0)
Bicarbonate: 24.2 mmol/L (ref 20.0–28.0)
Bicarbonate: 25.2 mmol/L (ref 20.0–28.0)
Bicarbonate: 24.8 mmol/L (ref 20.0–28.0)
Calcium, Ion: 1.1 mmol/L — ABNORMAL LOW (ref 1.15–1.40)
Calcium, Ion: 1.22 mmol/L (ref 1.15–1.40)
Calcium, Ion: 1.23 mmol/L (ref 1.15–1.40)
HCT: 38 % — ABNORMAL LOW (ref 39.0–52.0)
HCT: 40 % (ref 39.0–52.0)
HCT: 41 % (ref 39.0–52.0)
Hemoglobin: 12.9 g/dL — ABNORMAL LOW (ref 13.0–17.0)
Hemoglobin: 13.6 g/dL (ref 13.0–17.0)
Hemoglobin: 13.9 g/dL (ref 13.0–17.0)
O2 Saturation: 65 %
O2 Saturation: 67 %
O2 Saturation: 70 %
Potassium: 4.1 mmol/L (ref 3.5–5.1)
Potassium: 4.4 mmol/L (ref 3.5–5.1)
Potassium: 4.5 mmol/L (ref 3.5–5.1)
Sodium: 136 mmol/L (ref 135–145)
Sodium: 139 mmol/L (ref 135–145)
Sodium: 136 mmol/L (ref 135–145)
TCO2: 26 mmol/L (ref 22–32)
TCO2: 27 mmol/L (ref 22–32)
TCO2: 26 mmol/L (ref 22–32)
pCO2, Ven: 42.8 mmHg — ABNORMAL LOW (ref 44–60)
pCO2, Ven: 44.7 mmHg (ref 44–60)
pCO2, Ven: 43.5 mmHg — ABNORMAL LOW (ref 44–60)
pH, Ven: 7.358 (ref 7.25–7.43)
pH, Ven: 7.361 (ref 7.25–7.43)
pH, Ven: 7.365 (ref 7.25–7.43)
pO2, Ven: 35 mmHg (ref 32–45)
pO2, Ven: 37 mmHg (ref 32–45)
pO2, Ven: 38 mmHg (ref 32–45)

## 2024-10-17 LAB — POCT I-STAT 7, (LYTES, BLD GAS, ICA,H+H)
Acid-base deficit: 2 mmol/L (ref 0.0–2.0)
Bicarbonate: 22.8 mmol/L (ref 20.0–28.0)
Calcium, Ion: 1.23 mmol/L (ref 1.15–1.40)
HCT: 40 % (ref 39.0–52.0)
Hemoglobin: 13.6 g/dL (ref 13.0–17.0)
O2 Saturation: 97 %
Potassium: 4.5 mmol/L (ref 3.5–5.1)
Sodium: 135 mmol/L (ref 135–145)
TCO2: 24 mmol/L (ref 22–32)
pCO2 arterial: 37.3 mmHg (ref 32–48)
pH, Arterial: 7.395 (ref 7.35–7.45)
pO2, Arterial: 86 mmHg (ref 83–108)

## 2024-10-17 MED ORDER — HEPARIN SODIUM (PORCINE) 1000 UNIT/ML IJ SOLN
INTRAMUSCULAR | Status: AC
  Filled 2024-10-17: qty 10

## 2024-10-17 MED ORDER — SODIUM CHLORIDE 0.9 % IV SOLN: 250.0000 mL | INTRAVENOUS | Status: DC | PRN

## 2024-10-17 MED ORDER — HEPARIN (PORCINE) IN NACL 1000-0.9 UT/500ML-% IV SOLN
INTRAVENOUS | Status: DC | PRN
  Administered 2024-10-17 (×2): 500 mL

## 2024-10-17 MED ORDER — FENTANYL CITRATE (PF) 100 MCG/2ML IJ SOLN
INTRAMUSCULAR | Status: AC
  Filled 2024-10-17: qty 2

## 2024-10-17 MED ORDER — SODIUM CHLORIDE 0.9% FLUSH: 3.0000 mL | Freq: Two times a day (BID) | INTRAVENOUS | Status: DC

## 2024-10-17 MED ORDER — LIDOCAINE HCL (PF) 1 % IJ SOLN
INTRAMUSCULAR | Status: AC
  Filled 2024-10-17: qty 30

## 2024-10-17 MED ORDER — SODIUM CHLORIDE 0.9% FLUSH: 3.0000 mL | INTRAVENOUS | Status: DC | PRN

## 2024-10-17 MED ORDER — LIDOCAINE HCL (PF) 1 % IJ SOLN
INTRAMUSCULAR | Status: DC | PRN
  Administered 2024-10-17: 5 mL via INTRADERMAL

## 2024-10-17 MED ORDER — MIDAZOLAM HCL 2 MG/2ML IJ SOLN
INTRAMUSCULAR | Status: AC
  Filled 2024-10-17: qty 2

## 2024-10-17 MED ORDER — VERAPAMIL HCL 2.5 MG/ML IV SOLN
INTRAVENOUS | Status: AC
  Filled 2024-10-17: qty 2

## 2024-10-17 MED ORDER — ONDANSETRON HCL 4 MG/2ML IJ SOLN: 4.0000 mg | Freq: Four times a day (QID) | INTRAMUSCULAR | Status: DC | PRN

## 2024-10-17 MED ORDER — FREE WATER: 250.0000 mL | Freq: Once | Status: DC

## 2024-10-17 MED ORDER — FENTANYL CITRATE (PF) 100 MCG/2ML IJ SOLN
INTRAMUSCULAR | Status: DC | PRN
  Administered 2024-10-17: 25 ug via INTRAVENOUS

## 2024-10-17 MED ORDER — ACETAMINOPHEN 325 MG PO TABS: 650.0000 mg | ORAL_TABLET | ORAL | Status: DC | PRN

## 2024-10-17 MED ORDER — CLOPIDOGREL BISULFATE 75 MG PO TABS: 75.0000 mg | ORAL_TABLET | ORAL | Status: DC

## 2024-10-17 MED ORDER — MIDAZOLAM HCL (PF) 2 MG/2ML IJ SOLN
INTRAMUSCULAR | Status: DC | PRN
  Administered 2024-10-17: .25 mg via INTRAVENOUS

## 2024-10-17 MED ORDER — IOHEXOL 350 MG/ML SOLN
INTRAVENOUS | Status: DC | PRN
  Administered 2024-10-17: 30 mL

## 2024-10-17 MED ORDER — HEPARIN SODIUM (PORCINE) 1000 UNIT/ML IJ SOLN
INTRAMUSCULAR | Status: DC | PRN
  Administered 2024-10-17: 4000 [IU] via INTRAVENOUS

## 2024-10-17 MED ORDER — ASPIRIN 81 MG PO CHEW: 81.0000 mg | CHEWABLE_TABLET | ORAL | Status: DC

## 2024-10-17 NOTE — Progress Notes (Signed)
 Discharge instructions reviewed with patient and son at bedside. Denies questions concerns. PT tolerated PO intake. Incision site remains clean dry and intact. No s/s of complications. PT escorted from the unit via wheel chair to personal vehicle.

## 2024-10-17 NOTE — Interval H&P Note (Signed)
 History and Physical Interval Note:  10/17/2024 9:45 AM  Troy Sanchez  has presented today for surgery, with the diagnosis of cad  sob.  The various methods of treatment have been discussed with the patient and family. After consideration of risks, benefits and other options for treatment, the patient has consented to  Procedures: RIGHT/LEFT HEART CATH AND CORONARY ANGIOGRAPHY (N/A) and possible coronary angioplasty as a surgical intervention.  The patient's history has been reviewed, patient examined, no change in status, stable for surgery.  I have reviewed the patient's chart and labs.  Questions were answered to the patient's satisfaction.     Gordy Bergamo

## 2024-10-17 NOTE — Discharge Instructions (Signed)
 Radial Site Care The following information offers guidance on how to care for yourself after your procedure. Your health care provider may also give you more specific instructions. If you have problems or questions, contact your health care provider. What can I expect after the procedure? After the procedure, it is common to have bruising and tenderness in the incision area. Follow these instructions at home: Incision site care  Follow instructions from your health care provider about how to take care of your incision site. Make sure you: Wash your hands with soap and water for at least 20 seconds before and after you change your bandage (dressing). If soap and water are not available, use hand sanitizer. Remove your dressing in 24 hours. Leave stitches (sutures), skin glue, or adhesive strips in place. These skin closures may need to stay in place for 2 weeks or longer. If adhesive strip edges start to loosen and curl up, you may trim the loose edges. Do not remove adhesive strips completely unless your health care provider tells you to do that. Do not take baths, swim, or use a hot tub for at least 1 week. You may shower 24 hours after the procedure or as told by your health care provider. Remove the dressing and gently wash the incision area with plain soap and water. Pat the area dry with a clean towel. Do not rub the site. That could cause bleeding. Do not apply powder or lotion to the site. Check your incision site every day for signs of infection. Check for: Redness, swelling, or pain. Fluid or blood. Warmth. Pus or a bad smell. Activity For 24 hours after the procedure, or as directed by your health care provider: Do not flex or bend the affected arm. Do not push or pull heavy objects with the affected arm. Do not operate machinery or power tools. Do not drive. You should not drive yourself home from the hospital or clinic if you go home during that time period. You may drive 24  hours after the procedure unless your health care provider tells you not to. Do not lift anything that is heavier than 10 lb (4.5 kg), or the limit that you are told, until your health care provider says that it is safe. Return to your normal activities as told by your health care provider. Ask your health care provider what activities are safe for you and when you can return to work. If you were given a sedative during the procedure, it can affect you for several hours. Do not drive or operate machinery until your health care provider says that it is safe. General instructions Take over-the-counter and prescription medicines only as told by your health care provider. If you will be going home right after the procedure, plan to have a responsible adult care for you for the time you are told. This is important. Keep all follow-up visits. This is important. Contact a health care provider if: You have a fever or chills. You have any of these signs of infection at your incision site: Redness, swelling, or pain. Fluid or blood. Warmth. Pus or a bad smell. Get help right away if: The incision area swells very fast. The incision area is bleeding, and the bleeding does not stop when you hold steady pressure on the area. Your arm or hand becomes pale, cool, tingly, or numb. These symptoms may represent a serious problem that is an emergency. Do not wait to see if the symptoms will go away. Get medical  help right away. Call your local emergency services (911 in the [place] Do not drive yourself to the hospital. Summary After the procedure, it is common to have bruising and tenderness at the incision site. Follow instructions from your health care provider about how to take care of your radial site incision. Check the incision every day for signs of infection. Do not lift anything that is heavier than 10 lb (4.5 kg), or the limit that you are told, until your health care provider says that it is  safe. Get help right away if the incision area swells very fast, you have bleeding at the incision site that will not stop, or your arm or hand becomes pale, cool, or numb. This information is not intended to replace advice given to you by your health care provider. Make sure you discuss any questions you have with your health care provider. Document Revised: 11/16/2020 Document Reviewed: 11/16/2020 Elsevier Patient Education  2024 [employer].

## 2024-10-18 MED FILL — Verapamil HCl IV Soln 2.5 MG/ML: INTRAVENOUS | Qty: 2 | Status: AC

## 2024-11-01 ENCOUNTER — Other Ambulatory Visit (HOSPITAL_COMMUNITY): Payer: Self-pay

## 2024-11-01 ENCOUNTER — Encounter: Payer: Self-pay | Admitting: Cardiology

## 2024-11-01 ENCOUNTER — Ambulatory Visit: Admitting: Cardiology

## 2024-11-01 VITALS — BP 113/68 | HR 52 | Resp 16 | Ht 70.0 in | Wt 210.4 lb

## 2024-11-01 DIAGNOSIS — R0602 Shortness of breath: Secondary | ICD-10-CM

## 2024-11-01 DIAGNOSIS — I2781 Cor pulmonale (chronic): Secondary | ICD-10-CM | POA: Diagnosis not present

## 2024-11-01 DIAGNOSIS — J849 Interstitial pulmonary disease, unspecified: Secondary | ICD-10-CM

## 2024-11-01 DIAGNOSIS — I2723 Pulmonary hypertension due to lung diseases and hypoxia: Secondary | ICD-10-CM

## 2024-11-01 MED ORDER — SILDENAFIL CITRATE 20 MG PO TABS
20.0000 mg | ORAL_TABLET | Freq: Three times a day (TID) | ORAL | 2 refills | Status: AC
  Filled 2024-11-01: qty 90, 30d supply, fill #0

## 2024-11-01 NOTE — Progress Notes (Signed)
 " Cardiology Office Note:  .   Date:  11/01/2024  ID:  Troy Sanchez, DOB 1941-05-18, MRN [id number] PCP: Clarice Nottingham, MD  [facility] Providers Cardiologist:  Gordy Bergamo, MD   History of Present Illness: .   Troy Sanchez is a 84 y.o. male with hypertension, hyperlipidemia, moderate CAD (cardiac catheterization in 2017 mid LAD 50% stenosis otherwise no significant disease), mild pulmonary hypertension, hyperglycemia, chronic dyspnea and also pulmonary fibrosis, esophageal strictures status post dilatation in the past presents for follow-up of dyspnea.     He is being followed for IPF with repeat CT scan on 09/03/2024 which shows progression of IPF, enlarged pulmonary trunk indicator of pulmonary arterial hypertension.  I repeated echocardiogram on 08/20/2024 revealing new onset mild to moderate decrease in LVEF at 40 to 45% with global hypokinesis and flattened septum suggestive of pressure and volume overload from RV, RV severely enlarged with mild reduction in RV function.  There was no TR to estimate PA pressure.  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  He presents for postcardiac catheterization follow-up of dyspnea, his son is present.  No change in symptoms of dyspnea, fortunately no leg edema, no PND or orthopnea.  No chest pain or palpitations.    Discussed the use of AI scribe software for clinical note transcription with the patient, who gave verbal consent to proceed.  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 He was referred by Dr. Dorethia Gear for evaluation of his pulmonary hypertension.  A right heart catheterization confirmed pulmonary hypertension. A coronary angiogram showed no significant coronary  artery blockages.  His current medications include metoprolol  25 mg twice daily, rosuvastatin, and clopidogrel . He takes clopidogrel  for minor blockages in the heart and brain. He does not have hypertension and has no issues with blood pressure control.  He is scheduled for a pulmonary function test and follow-up with his pulmonologist.   Cardiac Studies relevent.    Cardiac Catheterization 10/17/2024:     Impression and recommendations: Findings consistent with nonischemic cardiomyopathy, suspect new onset mild LV systolic dysfunction is due to chronic cor pulmonale.  Mild pulmonary hypertension with PVR of 4.53 with normal EDP/decreased PCW suggests WHO group 3 PAH.  EKG:      Labs   No results found for: CHOL, HDL, LDLCALC, LDLDIRECT, TRIG, CHOLHDL No results found for: LIPOA  Recent Labs    07/04/24 0903 09/19/24 1256 10/17/24 0955 10/17/24 0958 10/17/24 1002  NA 136 138 135 136  136 139  K 4.8 4.4 4.5 4.5  4.4 4.1  CL 98 99  --   --   --   CO2 24 25  --   --   --   GLUCOSE 83 89  --   --   --   BUN 9 11  --   --   --   CREATININE 1.47* 1.28*  --   --   --   CALCIUM  9.4 9.8  --   --   --     Lab Results  Component Value Date   ALT 11 07/04/2024   AST 22 07/04/2024   ALKPHOS 100 07/04/2024   BILITOT 0.8 07/04/2024      Latest Ref Rng & Units 10/17/2024   10:02  AM 10/17/2024    9:58 AM 10/17/2024    9:55 AM  CBC  Hemoglobin 13.0 - 17.0 g/dL 87.0  86.3    86.0  86.3   Hematocrit 39.0 - 52.0 % 38.0  40.0    41.0  40.0    No results found for: HGBA1C  Lab Results  Component Value Date   TSH 3.510 07/04/2024    ROS  Review of Systems  Cardiovascular:  Positive for dyspnea on exertion. Negative for chest pain, leg swelling, orthopnea and syncope.   Physical Exam:   VS:  BP 113/68 (BP Location: Left Arm, Patient Position: Sitting, Cuff Size: Normal)   Pulse (!) 52   Resp 16   Ht 5' 10 (1.778 m)   Wt 210 lb 6.4 oz (95.4 kg)   SpO2 95%    BMI 30.19 kg/m    Wt Readings from Last 3 Encounters:  11/01/24 210 lb 6.4 oz (95.4 kg)  10/17/24 208 lb (94.3 kg)  09/26/24 211 lb 3.2 oz (95.8 kg)    BP Readings from Last 3 Encounters:  11/01/24 113/68  10/17/24 126/72  09/26/24 102/66   Physical Exam Neck:     Vascular: No carotid bruit or JVD.  Cardiovascular:     Rate and Rhythm: Normal rate and regular rhythm. Occasional Extrasystoles are present.    Pulses: Intact distal pulses.     Heart sounds: Normal heart sounds. No murmur heard.    No gallop.  Pulmonary:     Effort: Pulmonary effort is normal.     Breath sounds: Examination of the right-middle field reveals rhonchi. Examination of the left-middle field reveals rhonchi. Examination of the right-lower field reveals rhonchi. Examination of the left-lower field reveals rhonchi. Rhonchi present.  Abdominal:     General: Bowel sounds are normal.     Palpations: Abdomen is soft.  Musculoskeletal:     Right lower leg: No edema.     Left lower leg: No edema.     ASSESSMENT AND PLAN: .      ICD-10-CM   1. Shortness of breath  R06.02     2. ILD (interstitial lung disease) (HCC)  J84.9     3. WHO group 3 pulmonary arterial hypertension (HCC)  I27.23 sildenafil  (REVATIO ) 20 MG tablet    4. 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 Right heart catheterization confirms pulmonary hypertension. 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 No peripheral edema or ascites. Goal is to prevent progression to heart failure. - Discontinued metoprolol  to potentially alleviate dyspnea. - Initiated sildenafil  20 mg three times daily to relax pulmonary arteries and  reduce right heart strain. - Instructed to monitor oxygen levels at home; discontinue sildenafil  if oxygen levels drop significantly. - Coordinate with Dr. Hettie for earlier appointment if possible. - Scheduled follow-up in three months to assess response to treatment.   Follow up: 3 months. WHO Gp III PAD F/U Sildenafil  Rx  Signed,  Gordy Bergamo, MD, [facility] 11/01/2024, 12:07 PM [facility] [address] [place], [place] [address] Phone: [phone]. Fax:  [phone]  "

## 2024-11-01 NOTE — Patient Instructions (Signed)
 Medication Instructions:  START  sildenafil  (REVATIO ) 20 MG tablet        Take 1 tablet (20 mg total) by mouth 3 (three) times daily   *If you need a refill on your cardiac medications before your next appointment, please call your pharmacy*   Follow-Up: At [facility], you and your health needs are our priority.  As part of our continuing mission to provide you with exceptional heart care, our providers are all part of one team.  This team includes your primary Cardiologist (physician) and Advanced Practice Providers or APPs (Physician Assistants and Nurse Practitioners) who all work together to provide you with the care you need, when you need it.  Your next appointment:   3 month(s)  Provider:   Gordy Bergamo, MD       We recommend signing up for the patient portal called MyChart.  Patients are able to view lab/test results, encounter notes, upcoming appointments, etc.  Non-urgent messages can be sent to your provider as well, go to [email or web address].

## 2025-01-08 ENCOUNTER — Ambulatory Visit: Admitting: Internal Medicine

## 2025-01-21 ENCOUNTER — Encounter

## 2025-01-21 ENCOUNTER — Ambulatory Visit: Admitting: Internal Medicine

## 2025-02-07 ENCOUNTER — Ambulatory Visit: Admitting: Cardiology
